# Patient Record
Sex: Female | Born: 1935
Health system: Southern US, Community
[De-identification: ages and names within clinical notes are randomized; demographics above are authoritative.]

## PROBLEM LIST (undated history)

## (undated) DIAGNOSIS — E785 Hyperlipidemia, unspecified: Secondary | ICD-10-CM

## (undated) DIAGNOSIS — I639 Cerebral infarction, unspecified: Secondary | ICD-10-CM

## (undated) DIAGNOSIS — I10 Essential (primary) hypertension: Secondary | ICD-10-CM

## (undated) DIAGNOSIS — I700 Atherosclerosis of aorta: Secondary | ICD-10-CM

## (undated) DIAGNOSIS — J449 Chronic obstructive pulmonary disease, unspecified: Secondary | ICD-10-CM

## (undated) DIAGNOSIS — I2510 Atherosclerotic heart disease of native coronary artery without angina pectoris: Secondary | ICD-10-CM

## (undated) HISTORY — DX: Atherosclerosis of aorta: I70.0

## (undated) HISTORY — DX: Chronic obstructive pulmonary disease, unspecified: J44.9

## (undated) HISTORY — DX: Essential (primary) hypertension: I10

## (undated) HISTORY — DX: Atherosclerotic heart disease of native coronary artery without angina pectoris: I25.10

## (undated) HISTORY — DX: Hyperlipidemia, unspecified: E78.5

## (undated) HISTORY — DX: Cerebral infarction, unspecified: I63.9

## (undated) HISTORY — PX: NECK SURGERY: SHX720

## (undated) HISTORY — PX: CAROTID ENDARTERECTOMY: SUR193

---

## 2000-11-11 ENCOUNTER — Encounter: Payer: Self-pay | Admitting: *Deleted

## 2000-11-13 ENCOUNTER — Inpatient Hospital Stay (HOSPITAL_COMMUNITY): Admission: RE | Admit: 2000-11-13 | Discharge: 2000-11-14 | Payer: Self-pay | Admitting: *Deleted

## 2001-03-13 ENCOUNTER — Encounter: Payer: Self-pay | Admitting: *Deleted

## 2001-03-16 ENCOUNTER — Inpatient Hospital Stay (HOSPITAL_COMMUNITY): Admission: RE | Admit: 2001-03-16 | Discharge: 2001-03-17 | Payer: Self-pay | Admitting: *Deleted

## 2005-01-01 ENCOUNTER — Encounter: Admission: RE | Admit: 2005-01-01 | Discharge: 2005-01-01 | Payer: Self-pay | Admitting: Orthopedic Surgery

## 2005-01-08 ENCOUNTER — Encounter: Admission: RE | Admit: 2005-01-08 | Discharge: 2005-01-08 | Payer: Self-pay | Admitting: Orthopedic Surgery

## 2005-01-10 ENCOUNTER — Ambulatory Visit (HOSPITAL_BASED_OUTPATIENT_CLINIC_OR_DEPARTMENT_OTHER): Admission: RE | Admit: 2005-01-10 | Discharge: 2005-01-10 | Payer: Self-pay | Admitting: Orthopedic Surgery

## 2005-01-10 ENCOUNTER — Ambulatory Visit (HOSPITAL_COMMUNITY): Admission: RE | Admit: 2005-01-10 | Discharge: 2005-01-10 | Payer: Self-pay | Admitting: Orthopedic Surgery

## 2011-02-11 DIAGNOSIS — R05 Cough: Secondary | ICD-10-CM | POA: Diagnosis not present

## 2011-02-11 DIAGNOSIS — E559 Vitamin D deficiency, unspecified: Secondary | ICD-10-CM | POA: Diagnosis not present

## 2011-02-11 DIAGNOSIS — I10 Essential (primary) hypertension: Secondary | ICD-10-CM | POA: Diagnosis not present

## 2011-02-11 DIAGNOSIS — E039 Hypothyroidism, unspecified: Secondary | ICD-10-CM | POA: Diagnosis not present

## 2011-02-19 DIAGNOSIS — J209 Acute bronchitis, unspecified: Secondary | ICD-10-CM | POA: Diagnosis not present

## 2011-03-19 DIAGNOSIS — I10 Essential (primary) hypertension: Secondary | ICD-10-CM | POA: Diagnosis not present

## 2011-03-19 DIAGNOSIS — E559 Vitamin D deficiency, unspecified: Secondary | ICD-10-CM | POA: Diagnosis not present

## 2011-03-26 DIAGNOSIS — Z1231 Encounter for screening mammogram for malignant neoplasm of breast: Secondary | ICD-10-CM | POA: Diagnosis not present

## 2011-07-09 DIAGNOSIS — J209 Acute bronchitis, unspecified: Secondary | ICD-10-CM | POA: Diagnosis not present

## 2011-07-09 DIAGNOSIS — J0100 Acute maxillary sinusitis, unspecified: Secondary | ICD-10-CM | POA: Diagnosis not present

## 2011-09-03 DIAGNOSIS — Z961 Presence of intraocular lens: Secondary | ICD-10-CM | POA: Diagnosis not present

## 2011-09-03 DIAGNOSIS — E7800 Pure hypercholesterolemia, unspecified: Secondary | ICD-10-CM | POA: Diagnosis not present

## 2011-09-03 DIAGNOSIS — E559 Vitamin D deficiency, unspecified: Secondary | ICD-10-CM | POA: Diagnosis not present

## 2011-09-03 DIAGNOSIS — J309 Allergic rhinitis, unspecified: Secondary | ICD-10-CM | POA: Diagnosis not present

## 2011-09-03 DIAGNOSIS — E039 Hypothyroidism, unspecified: Secondary | ICD-10-CM | POA: Diagnosis not present

## 2011-09-03 DIAGNOSIS — J449 Chronic obstructive pulmonary disease, unspecified: Secondary | ICD-10-CM | POA: Diagnosis not present

## 2011-12-04 DIAGNOSIS — Z23 Encounter for immunization: Secondary | ICD-10-CM | POA: Diagnosis not present

## 2011-12-28 DIAGNOSIS — J189 Pneumonia, unspecified organism: Secondary | ICD-10-CM | POA: Diagnosis not present

## 2012-01-01 DIAGNOSIS — J42 Unspecified chronic bronchitis: Secondary | ICD-10-CM | POA: Diagnosis not present

## 2012-01-17 DIAGNOSIS — L259 Unspecified contact dermatitis, unspecified cause: Secondary | ICD-10-CM | POA: Diagnosis not present

## 2012-02-10 DIAGNOSIS — M2060 Acquired deformities of toe(s), unspecified, unspecified foot: Secondary | ICD-10-CM | POA: Diagnosis not present

## 2012-02-10 DIAGNOSIS — L84 Corns and callosities: Secondary | ICD-10-CM | POA: Diagnosis not present

## 2012-02-10 DIAGNOSIS — L97509 Non-pressure chronic ulcer of other part of unspecified foot with unspecified severity: Secondary | ICD-10-CM | POA: Diagnosis not present

## 2012-02-24 DIAGNOSIS — M2060 Acquired deformities of toe(s), unspecified, unspecified foot: Secondary | ICD-10-CM | POA: Diagnosis not present

## 2012-02-24 DIAGNOSIS — L84 Corns and callosities: Secondary | ICD-10-CM | POA: Diagnosis not present

## 2012-04-01 DIAGNOSIS — I10 Essential (primary) hypertension: Secondary | ICD-10-CM | POA: Diagnosis not present

## 2012-04-01 DIAGNOSIS — M899 Disorder of bone, unspecified: Secondary | ICD-10-CM | POA: Diagnosis not present

## 2012-04-01 DIAGNOSIS — E039 Hypothyroidism, unspecified: Secondary | ICD-10-CM | POA: Diagnosis not present

## 2012-04-01 DIAGNOSIS — Z79899 Other long term (current) drug therapy: Secondary | ICD-10-CM | POA: Diagnosis not present

## 2012-04-24 DIAGNOSIS — J0100 Acute maxillary sinusitis, unspecified: Secondary | ICD-10-CM | POA: Diagnosis not present

## 2012-04-24 DIAGNOSIS — J209 Acute bronchitis, unspecified: Secondary | ICD-10-CM | POA: Diagnosis not present

## 2012-05-12 DIAGNOSIS — Z1231 Encounter for screening mammogram for malignant neoplasm of breast: Secondary | ICD-10-CM | POA: Diagnosis not present

## 2012-07-15 DIAGNOSIS — M5380 Other specified dorsopathies, site unspecified: Secondary | ICD-10-CM | POA: Diagnosis not present

## 2012-07-15 DIAGNOSIS — M999 Biomechanical lesion, unspecified: Secondary | ICD-10-CM | POA: Diagnosis not present

## 2012-07-17 DIAGNOSIS — M999 Biomechanical lesion, unspecified: Secondary | ICD-10-CM | POA: Diagnosis not present

## 2012-07-17 DIAGNOSIS — M5380 Other specified dorsopathies, site unspecified: Secondary | ICD-10-CM | POA: Diagnosis not present

## 2012-07-20 DIAGNOSIS — M5380 Other specified dorsopathies, site unspecified: Secondary | ICD-10-CM | POA: Diagnosis not present

## 2012-07-20 DIAGNOSIS — M999 Biomechanical lesion, unspecified: Secondary | ICD-10-CM | POA: Diagnosis not present

## 2012-07-24 DIAGNOSIS — M999 Biomechanical lesion, unspecified: Secondary | ICD-10-CM | POA: Diagnosis not present

## 2012-07-24 DIAGNOSIS — M5380 Other specified dorsopathies, site unspecified: Secondary | ICD-10-CM | POA: Diagnosis not present

## 2012-08-20 DIAGNOSIS — Z7902 Long term (current) use of antithrombotics/antiplatelets: Secondary | ICD-10-CM | POA: Diagnosis not present

## 2012-08-20 DIAGNOSIS — E7800 Pure hypercholesterolemia, unspecified: Secondary | ICD-10-CM | POA: Diagnosis not present

## 2012-08-20 DIAGNOSIS — Z006 Encounter for examination for normal comparison and control in clinical research program: Secondary | ICD-10-CM | POA: Diagnosis not present

## 2012-08-20 DIAGNOSIS — I6350 Cerebral infarction due to unspecified occlusion or stenosis of unspecified cerebral artery: Secondary | ICD-10-CM | POA: Diagnosis not present

## 2012-08-20 DIAGNOSIS — F17200 Nicotine dependence, unspecified, uncomplicated: Secondary | ICD-10-CM | POA: Diagnosis not present

## 2012-08-20 DIAGNOSIS — I10 Essential (primary) hypertension: Secondary | ICD-10-CM | POA: Diagnosis not present

## 2012-08-20 DIAGNOSIS — M810 Age-related osteoporosis without current pathological fracture: Secondary | ICD-10-CM | POA: Diagnosis not present

## 2012-08-21 DIAGNOSIS — E7800 Pure hypercholesterolemia, unspecified: Secondary | ICD-10-CM | POA: Diagnosis not present

## 2012-10-21 DIAGNOSIS — F17200 Nicotine dependence, unspecified, uncomplicated: Secondary | ICD-10-CM | POA: Diagnosis not present

## 2012-10-21 DIAGNOSIS — R0602 Shortness of breath: Secondary | ICD-10-CM | POA: Diagnosis not present

## 2012-10-21 DIAGNOSIS — J209 Acute bronchitis, unspecified: Secondary | ICD-10-CM | POA: Diagnosis not present

## 2012-10-21 DIAGNOSIS — J189 Pneumonia, unspecified organism: Secondary | ICD-10-CM | POA: Diagnosis not present

## 2013-01-07 DIAGNOSIS — Z23 Encounter for immunization: Secondary | ICD-10-CM | POA: Diagnosis not present

## 2013-04-12 DIAGNOSIS — E7800 Pure hypercholesterolemia, unspecified: Secondary | ICD-10-CM | POA: Diagnosis not present

## 2013-04-12 DIAGNOSIS — I10 Essential (primary) hypertension: Secondary | ICD-10-CM | POA: Diagnosis not present

## 2013-04-12 DIAGNOSIS — J309 Allergic rhinitis, unspecified: Secondary | ICD-10-CM | POA: Diagnosis not present

## 2013-04-12 DIAGNOSIS — I6350 Cerebral infarction due to unspecified occlusion or stenosis of unspecified cerebral artery: Secondary | ICD-10-CM | POA: Diagnosis not present

## 2013-04-12 DIAGNOSIS — Z0000 Encounter for general adult medical examination without abnormal findings: Secondary | ICD-10-CM | POA: Diagnosis not present

## 2013-04-26 DIAGNOSIS — I059 Rheumatic mitral valve disease, unspecified: Secondary | ICD-10-CM | POA: Diagnosis not present

## 2013-04-26 DIAGNOSIS — I517 Cardiomegaly: Secondary | ICD-10-CM | POA: Diagnosis not present

## 2013-04-26 DIAGNOSIS — I359 Nonrheumatic aortic valve disorder, unspecified: Secondary | ICD-10-CM | POA: Diagnosis not present

## 2013-04-26 DIAGNOSIS — I079 Rheumatic tricuspid valve disease, unspecified: Secondary | ICD-10-CM | POA: Diagnosis not present

## 2013-04-26 DIAGNOSIS — R011 Cardiac murmur, unspecified: Secondary | ICD-10-CM | POA: Diagnosis not present

## 2013-05-07 DIAGNOSIS — M999 Biomechanical lesion, unspecified: Secondary | ICD-10-CM | POA: Diagnosis not present

## 2013-05-07 DIAGNOSIS — M5380 Other specified dorsopathies, site unspecified: Secondary | ICD-10-CM | POA: Diagnosis not present

## 2013-05-18 DIAGNOSIS — M76899 Other specified enthesopathies of unspecified lower limb, excluding foot: Secondary | ICD-10-CM | POA: Diagnosis not present

## 2013-05-31 DIAGNOSIS — I10 Essential (primary) hypertension: Secondary | ICD-10-CM | POA: Diagnosis not present

## 2013-05-31 DIAGNOSIS — F17200 Nicotine dependence, unspecified, uncomplicated: Secondary | ICD-10-CM | POA: Diagnosis not present

## 2013-05-31 DIAGNOSIS — I359 Nonrheumatic aortic valve disorder, unspecified: Secondary | ICD-10-CM | POA: Diagnosis not present

## 2013-05-31 DIAGNOSIS — E039 Hypothyroidism, unspecified: Secondary | ICD-10-CM | POA: Diagnosis not present

## 2013-05-31 DIAGNOSIS — E7800 Pure hypercholesterolemia, unspecified: Secondary | ICD-10-CM | POA: Diagnosis not present

## 2013-05-31 DIAGNOSIS — Z8673 Personal history of transient ischemic attack (TIA), and cerebral infarction without residual deficits: Secondary | ICD-10-CM | POA: Diagnosis not present

## 2013-06-02 DIAGNOSIS — L84 Corns and callosities: Secondary | ICD-10-CM | POA: Diagnosis not present

## 2013-06-02 DIAGNOSIS — M2060 Acquired deformities of toe(s), unspecified, unspecified foot: Secondary | ICD-10-CM | POA: Diagnosis not present

## 2013-06-08 DIAGNOSIS — I6529 Occlusion and stenosis of unspecified carotid artery: Secondary | ICD-10-CM | POA: Diagnosis not present

## 2013-06-08 DIAGNOSIS — R0602 Shortness of breath: Secondary | ICD-10-CM | POA: Diagnosis not present

## 2013-06-18 DIAGNOSIS — M47816 Spondylosis without myelopathy or radiculopathy, lumbar region: Secondary | ICD-10-CM | POA: Insufficient documentation

## 2013-06-18 DIAGNOSIS — M5137 Other intervertebral disc degeneration, lumbosacral region: Secondary | ICD-10-CM | POA: Diagnosis not present

## 2013-06-18 DIAGNOSIS — M545 Low back pain, unspecified: Secondary | ICD-10-CM | POA: Diagnosis not present

## 2013-06-18 DIAGNOSIS — M76899 Other specified enthesopathies of unspecified lower limb, excluding foot: Secondary | ICD-10-CM | POA: Diagnosis not present

## 2013-06-18 DIAGNOSIS — M47817 Spondylosis without myelopathy or radiculopathy, lumbosacral region: Secondary | ICD-10-CM | POA: Diagnosis not present

## 2013-06-18 DIAGNOSIS — M5136 Other intervertebral disc degeneration, lumbar region: Secondary | ICD-10-CM

## 2013-06-18 DIAGNOSIS — M51369 Other intervertebral disc degeneration, lumbar region without mention of lumbar back pain or lower extremity pain: Secondary | ICD-10-CM

## 2013-06-18 HISTORY — DX: Spondylosis without myelopathy or radiculopathy, lumbar region: M47.816

## 2013-06-18 HISTORY — DX: Other intervertebral disc degeneration, lumbar region: M51.36

## 2013-06-18 HISTORY — DX: Other intervertebral disc degeneration, lumbar region without mention of lumbar back pain or lower extremity pain: M51.369

## 2013-07-05 DIAGNOSIS — IMO0002 Reserved for concepts with insufficient information to code with codable children: Secondary | ICD-10-CM | POA: Diagnosis not present

## 2013-07-05 DIAGNOSIS — M47817 Spondylosis without myelopathy or radiculopathy, lumbosacral region: Secondary | ICD-10-CM | POA: Diagnosis not present

## 2013-07-05 DIAGNOSIS — M5137 Other intervertebral disc degeneration, lumbosacral region: Secondary | ICD-10-CM | POA: Diagnosis not present

## 2013-07-05 DIAGNOSIS — M129 Arthropathy, unspecified: Secondary | ICD-10-CM | POA: Diagnosis not present

## 2013-07-05 DIAGNOSIS — M48061 Spinal stenosis, lumbar region without neurogenic claudication: Secondary | ICD-10-CM | POA: Diagnosis not present

## 2013-07-05 DIAGNOSIS — M76899 Other specified enthesopathies of unspecified lower limb, excluding foot: Secondary | ICD-10-CM | POA: Diagnosis not present

## 2013-07-12 DIAGNOSIS — IMO0002 Reserved for concepts with insufficient information to code with codable children: Secondary | ICD-10-CM | POA: Diagnosis not present

## 2013-07-21 DIAGNOSIS — F17200 Nicotine dependence, unspecified, uncomplicated: Secondary | ICD-10-CM | POA: Diagnosis not present

## 2013-07-21 DIAGNOSIS — E7800 Pure hypercholesterolemia, unspecified: Secondary | ICD-10-CM | POA: Diagnosis not present

## 2013-07-21 DIAGNOSIS — Z23 Encounter for immunization: Secondary | ICD-10-CM | POA: Diagnosis not present

## 2013-07-21 DIAGNOSIS — I10 Essential (primary) hypertension: Secondary | ICD-10-CM | POA: Diagnosis not present

## 2013-07-28 DIAGNOSIS — M545 Low back pain, unspecified: Secondary | ICD-10-CM | POA: Diagnosis not present

## 2013-07-28 DIAGNOSIS — M47817 Spondylosis without myelopathy or radiculopathy, lumbosacral region: Secondary | ICD-10-CM | POA: Diagnosis not present

## 2013-07-28 DIAGNOSIS — M5137 Other intervertebral disc degeneration, lumbosacral region: Secondary | ICD-10-CM | POA: Diagnosis not present

## 2013-07-28 DIAGNOSIS — IMO0002 Reserved for concepts with insufficient information to code with codable children: Secondary | ICD-10-CM | POA: Diagnosis not present

## 2013-08-02 DIAGNOSIS — E7800 Pure hypercholesterolemia, unspecified: Secondary | ICD-10-CM | POA: Diagnosis not present

## 2013-08-02 DIAGNOSIS — I359 Nonrheumatic aortic valve disorder, unspecified: Secondary | ICD-10-CM | POA: Diagnosis not present

## 2013-08-02 DIAGNOSIS — I10 Essential (primary) hypertension: Secondary | ICD-10-CM | POA: Diagnosis not present

## 2013-08-02 DIAGNOSIS — Z8673 Personal history of transient ischemic attack (TIA), and cerebral infarction without residual deficits: Secondary | ICD-10-CM | POA: Diagnosis not present

## 2013-08-18 DIAGNOSIS — I129 Hypertensive chronic kidney disease with stage 1 through stage 4 chronic kidney disease, or unspecified chronic kidney disease: Secondary | ICD-10-CM

## 2013-08-18 DIAGNOSIS — I10 Essential (primary) hypertension: Secondary | ICD-10-CM

## 2013-08-18 DIAGNOSIS — M48062 Spinal stenosis, lumbar region with neurogenic claudication: Secondary | ICD-10-CM

## 2013-08-18 DIAGNOSIS — M5137 Other intervertebral disc degeneration, lumbosacral region: Secondary | ICD-10-CM | POA: Diagnosis not present

## 2013-08-18 DIAGNOSIS — M47817 Spondylosis without myelopathy or radiculopathy, lumbosacral region: Secondary | ICD-10-CM | POA: Diagnosis not present

## 2013-08-18 DIAGNOSIS — M48061 Spinal stenosis, lumbar region without neurogenic claudication: Secondary | ICD-10-CM | POA: Diagnosis not present

## 2013-08-18 DIAGNOSIS — E785 Hyperlipidemia, unspecified: Secondary | ICD-10-CM

## 2013-08-18 DIAGNOSIS — IMO0002 Reserved for concepts with insufficient information to code with codable children: Secondary | ICD-10-CM | POA: Diagnosis not present

## 2013-08-18 HISTORY — DX: Hyperlipidemia, unspecified: E78.5

## 2013-08-18 HISTORY — DX: Hypertensive chronic kidney disease with stage 1 through stage 4 chronic kidney disease, or unspecified chronic kidney disease: I12.9

## 2013-08-18 HISTORY — DX: Essential (primary) hypertension: I10

## 2013-08-18 HISTORY — DX: Spinal stenosis, lumbar region with neurogenic claudication: M48.062

## 2013-09-08 DIAGNOSIS — I639 Cerebral infarction, unspecified: Secondary | ICD-10-CM

## 2013-09-08 DIAGNOSIS — IMO0002 Reserved for concepts with insufficient information to code with codable children: Secondary | ICD-10-CM | POA: Diagnosis not present

## 2013-09-08 DIAGNOSIS — I6350 Cerebral infarction due to unspecified occlusion or stenosis of unspecified cerebral artery: Secondary | ICD-10-CM | POA: Diagnosis not present

## 2013-09-08 HISTORY — DX: Cerebral infarction, unspecified: I63.9

## 2013-09-16 DIAGNOSIS — G459 Transient cerebral ischemic attack, unspecified: Secondary | ICD-10-CM | POA: Diagnosis not present

## 2013-09-16 DIAGNOSIS — I6789 Other cerebrovascular disease: Secondary | ICD-10-CM | POA: Diagnosis not present

## 2013-09-20 DIAGNOSIS — M47817 Spondylosis without myelopathy or radiculopathy, lumbosacral region: Secondary | ICD-10-CM | POA: Diagnosis not present

## 2013-09-20 DIAGNOSIS — M25579 Pain in unspecified ankle and joints of unspecified foot: Secondary | ICD-10-CM | POA: Diagnosis not present

## 2013-09-20 DIAGNOSIS — M48061 Spinal stenosis, lumbar region without neurogenic claudication: Secondary | ICD-10-CM | POA: Diagnosis not present

## 2013-09-20 DIAGNOSIS — M5137 Other intervertebral disc degeneration, lumbosacral region: Secondary | ICD-10-CM | POA: Diagnosis not present

## 2013-09-21 DIAGNOSIS — I6350 Cerebral infarction due to unspecified occlusion or stenosis of unspecified cerebral artery: Secondary | ICD-10-CM | POA: Diagnosis not present

## 2013-09-21 DIAGNOSIS — I658 Occlusion and stenosis of other precerebral arteries: Secondary | ICD-10-CM | POA: Diagnosis not present

## 2013-09-27 DIAGNOSIS — I6350 Cerebral infarction due to unspecified occlusion or stenosis of unspecified cerebral artery: Secondary | ICD-10-CM | POA: Diagnosis not present

## 2013-09-27 DIAGNOSIS — IMO0002 Reserved for concepts with insufficient information to code with codable children: Secondary | ICD-10-CM | POA: Diagnosis not present

## 2013-10-13 DIAGNOSIS — M47817 Spondylosis without myelopathy or radiculopathy, lumbosacral region: Secondary | ICD-10-CM | POA: Diagnosis not present

## 2013-10-13 DIAGNOSIS — M48061 Spinal stenosis, lumbar region without neurogenic claudication: Secondary | ICD-10-CM | POA: Diagnosis not present

## 2013-10-13 DIAGNOSIS — M5137 Other intervertebral disc degeneration, lumbosacral region: Secondary | ICD-10-CM | POA: Diagnosis not present

## 2013-10-13 DIAGNOSIS — I10 Essential (primary) hypertension: Secondary | ICD-10-CM | POA: Diagnosis not present

## 2013-10-13 DIAGNOSIS — M25579 Pain in unspecified ankle and joints of unspecified foot: Secondary | ICD-10-CM | POA: Diagnosis not present

## 2013-10-26 DIAGNOSIS — Z1231 Encounter for screening mammogram for malignant neoplasm of breast: Secondary | ICD-10-CM | POA: Diagnosis not present

## 2013-10-27 DIAGNOSIS — L821 Other seborrheic keratosis: Secondary | ICD-10-CM | POA: Diagnosis not present

## 2013-11-01 DIAGNOSIS — Z23 Encounter for immunization: Secondary | ICD-10-CM | POA: Diagnosis not present

## 2013-11-11 DIAGNOSIS — N939 Abnormal uterine and vaginal bleeding, unspecified: Secondary | ICD-10-CM | POA: Diagnosis not present

## 2013-12-14 DIAGNOSIS — N950 Postmenopausal bleeding: Secondary | ICD-10-CM | POA: Diagnosis not present

## 2014-01-13 DIAGNOSIS — J209 Acute bronchitis, unspecified: Secondary | ICD-10-CM | POA: Diagnosis not present

## 2014-01-13 DIAGNOSIS — J449 Chronic obstructive pulmonary disease, unspecified: Secondary | ICD-10-CM | POA: Diagnosis not present

## 2014-01-13 DIAGNOSIS — R0902 Hypoxemia: Secondary | ICD-10-CM | POA: Diagnosis not present

## 2014-01-17 DIAGNOSIS — J441 Chronic obstructive pulmonary disease with (acute) exacerbation: Secondary | ICD-10-CM | POA: Diagnosis not present

## 2014-01-20 DIAGNOSIS — H524 Presbyopia: Secondary | ICD-10-CM | POA: Diagnosis not present

## 2014-01-20 DIAGNOSIS — H52223 Regular astigmatism, bilateral: Secondary | ICD-10-CM | POA: Diagnosis not present

## 2014-01-20 DIAGNOSIS — H5203 Hypermetropia, bilateral: Secondary | ICD-10-CM | POA: Diagnosis not present

## 2014-01-20 DIAGNOSIS — Z9849 Cataract extraction status, unspecified eye: Secondary | ICD-10-CM | POA: Diagnosis not present

## 2014-01-20 DIAGNOSIS — H35371 Puckering of macula, right eye: Secondary | ICD-10-CM | POA: Diagnosis not present

## 2014-01-20 DIAGNOSIS — H35372 Puckering of macula, left eye: Secondary | ICD-10-CM | POA: Diagnosis not present

## 2014-01-20 DIAGNOSIS — I10 Essential (primary) hypertension: Secondary | ICD-10-CM | POA: Diagnosis not present

## 2014-01-20 DIAGNOSIS — Z961 Presence of intraocular lens: Secondary | ICD-10-CM | POA: Diagnosis not present

## 2014-04-14 DIAGNOSIS — Z0000 Encounter for general adult medical examination without abnormal findings: Secondary | ICD-10-CM | POA: Diagnosis not present

## 2014-04-14 DIAGNOSIS — Z720 Tobacco use: Secondary | ICD-10-CM | POA: Diagnosis not present

## 2014-04-14 DIAGNOSIS — Z1389 Encounter for screening for other disorder: Secondary | ICD-10-CM | POA: Diagnosis not present

## 2014-04-14 DIAGNOSIS — R0602 Shortness of breath: Secondary | ICD-10-CM | POA: Diagnosis not present

## 2014-04-14 DIAGNOSIS — Z9181 History of falling: Secondary | ICD-10-CM | POA: Diagnosis not present

## 2014-04-14 DIAGNOSIS — E780 Pure hypercholesterolemia: Secondary | ICD-10-CM | POA: Diagnosis not present

## 2014-04-14 DIAGNOSIS — Z79899 Other long term (current) drug therapy: Secondary | ICD-10-CM | POA: Diagnosis not present

## 2014-04-14 DIAGNOSIS — D509 Iron deficiency anemia, unspecified: Secondary | ICD-10-CM | POA: Diagnosis not present

## 2014-04-19 DIAGNOSIS — R0602 Shortness of breath: Secondary | ICD-10-CM | POA: Diagnosis not present

## 2014-04-22 DIAGNOSIS — H43821 Vitreomacular adhesion, right eye: Secondary | ICD-10-CM | POA: Diagnosis not present

## 2014-04-22 DIAGNOSIS — I10 Essential (primary) hypertension: Secondary | ICD-10-CM | POA: Diagnosis not present

## 2014-04-22 DIAGNOSIS — H524 Presbyopia: Secondary | ICD-10-CM | POA: Diagnosis not present

## 2014-04-22 DIAGNOSIS — H5203 Hypermetropia, bilateral: Secondary | ICD-10-CM | POA: Diagnosis not present

## 2014-04-22 DIAGNOSIS — H35371 Puckering of macula, right eye: Secondary | ICD-10-CM | POA: Diagnosis not present

## 2014-04-22 DIAGNOSIS — H52223 Regular astigmatism, bilateral: Secondary | ICD-10-CM | POA: Diagnosis not present

## 2014-04-25 DIAGNOSIS — E876 Hypokalemia: Secondary | ICD-10-CM | POA: Diagnosis not present

## 2014-06-07 DIAGNOSIS — M5416 Radiculopathy, lumbar region: Secondary | ICD-10-CM | POA: Diagnosis not present

## 2014-06-07 DIAGNOSIS — M7062 Trochanteric bursitis, left hip: Secondary | ICD-10-CM | POA: Diagnosis not present

## 2014-06-07 DIAGNOSIS — M5136 Other intervertebral disc degeneration, lumbar region: Secondary | ICD-10-CM | POA: Diagnosis not present

## 2014-06-07 DIAGNOSIS — M4726 Other spondylosis with radiculopathy, lumbar region: Secondary | ICD-10-CM | POA: Diagnosis not present

## 2014-06-24 DIAGNOSIS — M5442 Lumbago with sciatica, left side: Secondary | ICD-10-CM | POA: Diagnosis not present

## 2014-06-24 DIAGNOSIS — M5136 Other intervertebral disc degeneration, lumbar region: Secondary | ICD-10-CM | POA: Diagnosis not present

## 2014-06-24 DIAGNOSIS — M5416 Radiculopathy, lumbar region: Secondary | ICD-10-CM | POA: Diagnosis not present

## 2014-06-24 DIAGNOSIS — M4726 Other spondylosis with radiculopathy, lumbar region: Secondary | ICD-10-CM | POA: Diagnosis not present

## 2014-08-19 DIAGNOSIS — M5416 Radiculopathy, lumbar region: Secondary | ICD-10-CM | POA: Diagnosis not present

## 2014-11-09 DIAGNOSIS — Q253 Supravalvular aortic stenosis: Secondary | ICD-10-CM | POA: Diagnosis not present

## 2014-11-09 DIAGNOSIS — I6990 Unspecified sequelae of unspecified cerebrovascular disease: Secondary | ICD-10-CM

## 2014-11-09 DIAGNOSIS — I350 Nonrheumatic aortic (valve) stenosis: Secondary | ICD-10-CM

## 2014-11-09 DIAGNOSIS — E782 Mixed hyperlipidemia: Secondary | ICD-10-CM | POA: Diagnosis not present

## 2014-11-09 HISTORY — DX: Nonrheumatic aortic (valve) stenosis: I35.0

## 2014-11-09 HISTORY — DX: Unspecified sequelae of unspecified cerebrovascular disease: I69.90

## 2014-11-18 DIAGNOSIS — E785 Hyperlipidemia, unspecified: Secondary | ICD-10-CM | POA: Diagnosis not present

## 2014-11-18 DIAGNOSIS — I350 Nonrheumatic aortic (valve) stenosis: Secondary | ICD-10-CM | POA: Diagnosis not present

## 2014-11-18 DIAGNOSIS — I10 Essential (primary) hypertension: Secondary | ICD-10-CM | POA: Diagnosis not present

## 2014-11-22 DIAGNOSIS — H35371 Puckering of macula, right eye: Secondary | ICD-10-CM | POA: Diagnosis not present

## 2014-11-22 DIAGNOSIS — H43823 Vitreomacular adhesion, bilateral: Secondary | ICD-10-CM | POA: Diagnosis not present

## 2014-11-29 DIAGNOSIS — Z1231 Encounter for screening mammogram for malignant neoplasm of breast: Secondary | ICD-10-CM | POA: Diagnosis not present

## 2014-12-09 DIAGNOSIS — Z23 Encounter for immunization: Secondary | ICD-10-CM | POA: Diagnosis not present

## 2014-12-15 DIAGNOSIS — I10 Essential (primary) hypertension: Secondary | ICD-10-CM | POA: Diagnosis not present

## 2014-12-16 DIAGNOSIS — Z79899 Other long term (current) drug therapy: Secondary | ICD-10-CM | POA: Diagnosis not present

## 2014-12-16 DIAGNOSIS — R5383 Other fatigue: Secondary | ICD-10-CM | POA: Diagnosis not present

## 2014-12-16 DIAGNOSIS — E7800 Pure hypercholesterolemia, unspecified: Secondary | ICD-10-CM | POA: Diagnosis not present

## 2014-12-16 DIAGNOSIS — F17200 Nicotine dependence, unspecified, uncomplicated: Secondary | ICD-10-CM | POA: Diagnosis not present

## 2014-12-16 DIAGNOSIS — I10 Essential (primary) hypertension: Secondary | ICD-10-CM | POA: Diagnosis not present

## 2014-12-26 DIAGNOSIS — D510 Vitamin B12 deficiency anemia due to intrinsic factor deficiency: Secondary | ICD-10-CM | POA: Diagnosis not present

## 2015-01-02 DIAGNOSIS — D510 Vitamin B12 deficiency anemia due to intrinsic factor deficiency: Secondary | ICD-10-CM | POA: Diagnosis not present

## 2015-01-09 DIAGNOSIS — D510 Vitamin B12 deficiency anemia due to intrinsic factor deficiency: Secondary | ICD-10-CM | POA: Diagnosis not present

## 2015-02-27 DIAGNOSIS — D510 Vitamin B12 deficiency anemia due to intrinsic factor deficiency: Secondary | ICD-10-CM | POA: Diagnosis not present

## 2015-03-29 DIAGNOSIS — D510 Vitamin B12 deficiency anemia due to intrinsic factor deficiency: Secondary | ICD-10-CM | POA: Diagnosis not present

## 2015-05-04 DIAGNOSIS — D510 Vitamin B12 deficiency anemia due to intrinsic factor deficiency: Secondary | ICD-10-CM | POA: Diagnosis not present

## 2015-05-04 DIAGNOSIS — Z0000 Encounter for general adult medical examination without abnormal findings: Secondary | ICD-10-CM | POA: Diagnosis not present

## 2015-05-04 DIAGNOSIS — Z9181 History of falling: Secondary | ICD-10-CM | POA: Diagnosis not present

## 2015-05-04 DIAGNOSIS — Z1389 Encounter for screening for other disorder: Secondary | ICD-10-CM | POA: Diagnosis not present

## 2015-05-04 DIAGNOSIS — I10 Essential (primary) hypertension: Secondary | ICD-10-CM | POA: Diagnosis not present

## 2015-05-23 DIAGNOSIS — H35373 Puckering of macula, bilateral: Secondary | ICD-10-CM | POA: Diagnosis not present

## 2015-05-23 DIAGNOSIS — F17200 Nicotine dependence, unspecified, uncomplicated: Secondary | ICD-10-CM | POA: Diagnosis not present

## 2015-05-23 DIAGNOSIS — IMO0001 Reserved for inherently not codable concepts without codable children: Secondary | ICD-10-CM | POA: Insufficient documentation

## 2015-05-23 DIAGNOSIS — H43313 Vitreous membranes and strands, bilateral: Secondary | ICD-10-CM | POA: Diagnosis not present

## 2015-05-23 DIAGNOSIS — H43393 Other vitreous opacities, bilateral: Secondary | ICD-10-CM | POA: Diagnosis not present

## 2015-05-23 DIAGNOSIS — H35341 Macular cyst, hole, or pseudohole, right eye: Secondary | ICD-10-CM | POA: Diagnosis not present

## 2015-05-23 DIAGNOSIS — I10 Essential (primary) hypertension: Secondary | ICD-10-CM | POA: Diagnosis not present

## 2015-05-23 DIAGNOSIS — E7800 Pure hypercholesterolemia, unspecified: Secondary | ICD-10-CM | POA: Diagnosis not present

## 2015-05-23 DIAGNOSIS — Z9841 Cataract extraction status, right eye: Secondary | ICD-10-CM | POA: Diagnosis not present

## 2015-05-23 DIAGNOSIS — H35371 Puckering of macula, right eye: Secondary | ICD-10-CM | POA: Diagnosis not present

## 2015-05-23 DIAGNOSIS — Z961 Presence of intraocular lens: Secondary | ICD-10-CM | POA: Diagnosis not present

## 2015-05-23 DIAGNOSIS — H59031 Cystoid macular edema following cataract surgery, right eye: Secondary | ICD-10-CM | POA: Diagnosis not present

## 2015-05-23 DIAGNOSIS — H3562 Retinal hemorrhage, left eye: Secondary | ICD-10-CM | POA: Diagnosis not present

## 2015-05-23 DIAGNOSIS — H52223 Regular astigmatism, bilateral: Secondary | ICD-10-CM | POA: Diagnosis not present

## 2015-05-23 DIAGNOSIS — I350 Nonrheumatic aortic (valve) stenosis: Secondary | ICD-10-CM | POA: Diagnosis not present

## 2015-05-23 DIAGNOSIS — H43813 Vitreous degeneration, bilateral: Secondary | ICD-10-CM | POA: Diagnosis not present

## 2015-05-23 DIAGNOSIS — Z9842 Cataract extraction status, left eye: Secondary | ICD-10-CM | POA: Diagnosis not present

## 2015-05-23 HISTORY — DX: Nicotine dependence, unspecified, uncomplicated: F17.200

## 2015-05-23 HISTORY — DX: Reserved for inherently not codable concepts without codable children: IMO0001

## 2015-05-31 DIAGNOSIS — I517 Cardiomegaly: Secondary | ICD-10-CM | POA: Diagnosis not present

## 2015-05-31 DIAGNOSIS — I350 Nonrheumatic aortic (valve) stenosis: Secondary | ICD-10-CM | POA: Diagnosis not present

## 2015-06-08 DIAGNOSIS — D510 Vitamin B12 deficiency anemia due to intrinsic factor deficiency: Secondary | ICD-10-CM | POA: Diagnosis not present

## 2015-08-22 DIAGNOSIS — H3562 Retinal hemorrhage, left eye: Secondary | ICD-10-CM | POA: Diagnosis not present

## 2015-08-22 DIAGNOSIS — I10 Essential (primary) hypertension: Secondary | ICD-10-CM | POA: Diagnosis not present

## 2015-10-02 DIAGNOSIS — N3090 Cystitis, unspecified without hematuria: Secondary | ICD-10-CM | POA: Diagnosis not present

## 2015-10-02 DIAGNOSIS — N3001 Acute cystitis with hematuria: Secondary | ICD-10-CM | POA: Diagnosis not present

## 2015-10-31 DIAGNOSIS — M5440 Lumbago with sciatica, unspecified side: Secondary | ICD-10-CM | POA: Diagnosis not present

## 2015-10-31 DIAGNOSIS — M5416 Radiculopathy, lumbar region: Secondary | ICD-10-CM | POA: Diagnosis not present

## 2015-10-31 DIAGNOSIS — M4806 Spinal stenosis, lumbar region: Secondary | ICD-10-CM | POA: Diagnosis not present

## 2015-10-31 DIAGNOSIS — M47816 Spondylosis without myelopathy or radiculopathy, lumbar region: Secondary | ICD-10-CM | POA: Diagnosis not present

## 2015-10-31 DIAGNOSIS — M62830 Muscle spasm of back: Secondary | ICD-10-CM | POA: Diagnosis not present

## 2015-10-31 DIAGNOSIS — M5136 Other intervertebral disc degeneration, lumbar region: Secondary | ICD-10-CM | POA: Diagnosis not present

## 2015-10-31 DIAGNOSIS — G8929 Other chronic pain: Secondary | ICD-10-CM | POA: Diagnosis not present

## 2015-11-13 DIAGNOSIS — Z23 Encounter for immunization: Secondary | ICD-10-CM | POA: Diagnosis not present

## 2015-11-22 DIAGNOSIS — E782 Mixed hyperlipidemia: Secondary | ICD-10-CM | POA: Diagnosis not present

## 2015-11-22 DIAGNOSIS — F17200 Nicotine dependence, unspecified, uncomplicated: Secondary | ICD-10-CM | POA: Diagnosis not present

## 2015-11-22 DIAGNOSIS — I6990 Unspecified sequelae of unspecified cerebrovascular disease: Secondary | ICD-10-CM | POA: Diagnosis not present

## 2015-11-22 DIAGNOSIS — I350 Nonrheumatic aortic (valve) stenosis: Secondary | ICD-10-CM | POA: Diagnosis not present

## 2015-11-29 DIAGNOSIS — E782 Mixed hyperlipidemia: Secondary | ICD-10-CM | POA: Diagnosis not present

## 2015-11-29 DIAGNOSIS — F17200 Nicotine dependence, unspecified, uncomplicated: Secondary | ICD-10-CM | POA: Diagnosis not present

## 2015-11-29 DIAGNOSIS — I6990 Unspecified sequelae of unspecified cerebrovascular disease: Secondary | ICD-10-CM | POA: Diagnosis not present

## 2015-11-29 DIAGNOSIS — I350 Nonrheumatic aortic (valve) stenosis: Secondary | ICD-10-CM | POA: Diagnosis not present

## 2015-12-21 DIAGNOSIS — M5416 Radiculopathy, lumbar region: Secondary | ICD-10-CM | POA: Diagnosis not present

## 2015-12-21 DIAGNOSIS — M47816 Spondylosis without myelopathy or radiculopathy, lumbar region: Secondary | ICD-10-CM | POA: Diagnosis not present

## 2015-12-21 DIAGNOSIS — M5136 Other intervertebral disc degeneration, lumbar region: Secondary | ICD-10-CM | POA: Diagnosis not present

## 2015-12-21 DIAGNOSIS — M48061 Spinal stenosis, lumbar region without neurogenic claudication: Secondary | ICD-10-CM | POA: Diagnosis not present

## 2015-12-21 DIAGNOSIS — M62830 Muscle spasm of back: Secondary | ICD-10-CM | POA: Diagnosis not present

## 2016-01-17 DIAGNOSIS — Z9181 History of falling: Secondary | ICD-10-CM | POA: Diagnosis not present

## 2016-01-17 DIAGNOSIS — E7800 Pure hypercholesterolemia, unspecified: Secondary | ICD-10-CM | POA: Diagnosis not present

## 2016-01-17 DIAGNOSIS — I10 Essential (primary) hypertension: Secondary | ICD-10-CM | POA: Diagnosis not present

## 2016-01-17 DIAGNOSIS — D510 Vitamin B12 deficiency anemia due to intrinsic factor deficiency: Secondary | ICD-10-CM | POA: Diagnosis not present

## 2016-01-17 DIAGNOSIS — Z8673 Personal history of transient ischemic attack (TIA), and cerebral infarction without residual deficits: Secondary | ICD-10-CM | POA: Diagnosis not present

## 2016-01-17 DIAGNOSIS — E039 Hypothyroidism, unspecified: Secondary | ICD-10-CM | POA: Diagnosis not present

## 2016-01-17 DIAGNOSIS — Z1389 Encounter for screening for other disorder: Secondary | ICD-10-CM | POA: Diagnosis not present

## 2016-03-13 DIAGNOSIS — Z1231 Encounter for screening mammogram for malignant neoplasm of breast: Secondary | ICD-10-CM | POA: Diagnosis not present

## 2016-05-01 DIAGNOSIS — M47816 Spondylosis without myelopathy or radiculopathy, lumbar region: Secondary | ICD-10-CM | POA: Diagnosis not present

## 2016-05-01 DIAGNOSIS — M5136 Other intervertebral disc degeneration, lumbar region: Secondary | ICD-10-CM | POA: Diagnosis not present

## 2016-05-01 DIAGNOSIS — M5416 Radiculopathy, lumbar region: Secondary | ICD-10-CM | POA: Diagnosis not present

## 2016-05-01 DIAGNOSIS — M48062 Spinal stenosis, lumbar region with neurogenic claudication: Secondary | ICD-10-CM | POA: Diagnosis not present

## 2016-06-19 DIAGNOSIS — I350 Nonrheumatic aortic (valve) stenosis: Secondary | ICD-10-CM | POA: Diagnosis not present

## 2016-06-19 DIAGNOSIS — I10 Essential (primary) hypertension: Secondary | ICD-10-CM | POA: Diagnosis not present

## 2016-06-19 DIAGNOSIS — F17200 Nicotine dependence, unspecified, uncomplicated: Secondary | ICD-10-CM | POA: Diagnosis not present

## 2016-06-19 DIAGNOSIS — E782 Mixed hyperlipidemia: Secondary | ICD-10-CM | POA: Diagnosis not present

## 2016-06-26 DIAGNOSIS — I350 Nonrheumatic aortic (valve) stenosis: Secondary | ICD-10-CM | POA: Diagnosis not present

## 2016-09-30 DIAGNOSIS — S0093XA Contusion of unspecified part of head, initial encounter: Secondary | ICD-10-CM | POA: Diagnosis not present

## 2016-09-30 DIAGNOSIS — Z1389 Encounter for screening for other disorder: Secondary | ICD-10-CM | POA: Diagnosis not present

## 2016-09-30 DIAGNOSIS — W19XXXA Unspecified fall, initial encounter: Secondary | ICD-10-CM | POA: Diagnosis not present

## 2016-09-30 DIAGNOSIS — Z0000 Encounter for general adult medical examination without abnormal findings: Secondary | ICD-10-CM | POA: Diagnosis not present

## 2016-09-30 DIAGNOSIS — I10 Essential (primary) hypertension: Secondary | ICD-10-CM | POA: Diagnosis not present

## 2016-09-30 DIAGNOSIS — S0083XA Contusion of other part of head, initial encounter: Secondary | ICD-10-CM | POA: Diagnosis not present

## 2016-09-30 DIAGNOSIS — R51 Headache: Secondary | ICD-10-CM | POA: Diagnosis not present

## 2016-10-03 DIAGNOSIS — I10 Essential (primary) hypertension: Secondary | ICD-10-CM | POA: Diagnosis not present

## 2016-10-03 DIAGNOSIS — Z6833 Body mass index (BMI) 33.0-33.9, adult: Secondary | ICD-10-CM | POA: Diagnosis not present

## 2016-10-03 DIAGNOSIS — S0003XA Contusion of scalp, initial encounter: Secondary | ICD-10-CM | POA: Diagnosis not present

## 2016-10-03 DIAGNOSIS — R269 Unspecified abnormalities of gait and mobility: Secondary | ICD-10-CM | POA: Diagnosis not present

## 2016-11-04 DIAGNOSIS — H524 Presbyopia: Secondary | ICD-10-CM | POA: Diagnosis not present

## 2016-11-04 DIAGNOSIS — H43313 Vitreous membranes and strands, bilateral: Secondary | ICD-10-CM | POA: Diagnosis not present

## 2016-11-04 DIAGNOSIS — I10 Essential (primary) hypertension: Secondary | ICD-10-CM | POA: Diagnosis not present

## 2016-11-04 DIAGNOSIS — H5203 Hypermetropia, bilateral: Secondary | ICD-10-CM | POA: Diagnosis not present

## 2016-11-04 DIAGNOSIS — Z961 Presence of intraocular lens: Secondary | ICD-10-CM | POA: Diagnosis not present

## 2016-11-04 DIAGNOSIS — H52223 Regular astigmatism, bilateral: Secondary | ICD-10-CM | POA: Diagnosis not present

## 2016-11-04 DIAGNOSIS — H43813 Vitreous degeneration, bilateral: Secondary | ICD-10-CM | POA: Diagnosis not present

## 2016-11-04 DIAGNOSIS — H35373 Puckering of macula, bilateral: Secondary | ICD-10-CM | POA: Diagnosis not present

## 2016-12-06 DIAGNOSIS — Z6834 Body mass index (BMI) 34.0-34.9, adult: Secondary | ICD-10-CM | POA: Diagnosis not present

## 2016-12-06 DIAGNOSIS — Z23 Encounter for immunization: Secondary | ICD-10-CM | POA: Diagnosis not present

## 2016-12-06 DIAGNOSIS — E559 Vitamin D deficiency, unspecified: Secondary | ICD-10-CM | POA: Diagnosis not present

## 2016-12-06 DIAGNOSIS — Z79899 Other long term (current) drug therapy: Secondary | ICD-10-CM | POA: Diagnosis not present

## 2016-12-06 DIAGNOSIS — Z1339 Encounter for screening examination for other mental health and behavioral disorders: Secondary | ICD-10-CM | POA: Diagnosis not present

## 2016-12-06 DIAGNOSIS — E7800 Pure hypercholesterolemia, unspecified: Secondary | ICD-10-CM | POA: Diagnosis not present

## 2016-12-06 DIAGNOSIS — R600 Localized edema: Secondary | ICD-10-CM | POA: Diagnosis not present

## 2016-12-06 DIAGNOSIS — Z1331 Encounter for screening for depression: Secondary | ICD-10-CM | POA: Diagnosis not present

## 2016-12-06 DIAGNOSIS — R5383 Other fatigue: Secondary | ICD-10-CM | POA: Diagnosis not present

## 2016-12-09 DIAGNOSIS — R0600 Dyspnea, unspecified: Secondary | ICD-10-CM

## 2016-12-09 DIAGNOSIS — J441 Chronic obstructive pulmonary disease with (acute) exacerbation: Secondary | ICD-10-CM | POA: Diagnosis not present

## 2016-12-09 DIAGNOSIS — R05 Cough: Secondary | ICD-10-CM | POA: Diagnosis not present

## 2016-12-09 DIAGNOSIS — J449 Chronic obstructive pulmonary disease, unspecified: Secondary | ICD-10-CM | POA: Diagnosis not present

## 2016-12-09 DIAGNOSIS — I350 Nonrheumatic aortic (valve) stenosis: Secondary | ICD-10-CM | POA: Diagnosis not present

## 2016-12-09 DIAGNOSIS — Z7902 Long term (current) use of antithrombotics/antiplatelets: Secondary | ICD-10-CM | POA: Diagnosis not present

## 2016-12-09 DIAGNOSIS — F17210 Nicotine dependence, cigarettes, uncomplicated: Secondary | ICD-10-CM | POA: Diagnosis not present

## 2016-12-09 DIAGNOSIS — E785 Hyperlipidemia, unspecified: Secondary | ICD-10-CM

## 2016-12-09 DIAGNOSIS — Z79899 Other long term (current) drug therapy: Secondary | ICD-10-CM | POA: Diagnosis not present

## 2016-12-09 DIAGNOSIS — R0602 Shortness of breath: Secondary | ICD-10-CM | POA: Diagnosis not present

## 2016-12-09 DIAGNOSIS — R0902 Hypoxemia: Secondary | ICD-10-CM | POA: Diagnosis not present

## 2016-12-09 DIAGNOSIS — I2510 Atherosclerotic heart disease of native coronary artery without angina pectoris: Secondary | ICD-10-CM

## 2016-12-09 DIAGNOSIS — I10 Essential (primary) hypertension: Secondary | ICD-10-CM | POA: Diagnosis present

## 2016-12-11 DIAGNOSIS — R0609 Other forms of dyspnea: Secondary | ICD-10-CM | POA: Insufficient documentation

## 2016-12-11 DIAGNOSIS — R0601 Orthopnea: Secondary | ICD-10-CM

## 2016-12-11 DIAGNOSIS — R0600 Dyspnea, unspecified: Secondary | ICD-10-CM

## 2016-12-11 DIAGNOSIS — R0602 Shortness of breath: Secondary | ICD-10-CM

## 2016-12-11 HISTORY — DX: Dyspnea, unspecified: R06.00

## 2016-12-11 HISTORY — DX: Other forms of dyspnea: R06.09

## 2016-12-12 DIAGNOSIS — J441 Chronic obstructive pulmonary disease with (acute) exacerbation: Secondary | ICD-10-CM | POA: Diagnosis not present

## 2016-12-12 DIAGNOSIS — I2510 Atherosclerotic heart disease of native coronary artery without angina pectoris: Secondary | ICD-10-CM | POA: Diagnosis not present

## 2016-12-12 DIAGNOSIS — I110 Hypertensive heart disease with heart failure: Secondary | ICD-10-CM | POA: Diagnosis present

## 2016-12-12 DIAGNOSIS — Z7902 Long term (current) use of antithrombotics/antiplatelets: Secondary | ICD-10-CM | POA: Diagnosis not present

## 2016-12-12 DIAGNOSIS — R0602 Shortness of breath: Secondary | ICD-10-CM | POA: Diagnosis not present

## 2016-12-12 DIAGNOSIS — I509 Heart failure, unspecified: Secondary | ICD-10-CM | POA: Diagnosis present

## 2016-12-12 DIAGNOSIS — R0603 Acute respiratory distress: Secondary | ICD-10-CM | POA: Diagnosis not present

## 2016-12-12 DIAGNOSIS — E785 Hyperlipidemia, unspecified: Secondary | ICD-10-CM | POA: Diagnosis not present

## 2016-12-12 DIAGNOSIS — Z79899 Other long term (current) drug therapy: Secondary | ICD-10-CM | POA: Diagnosis not present

## 2016-12-12 DIAGNOSIS — I350 Nonrheumatic aortic (valve) stenosis: Secondary | ICD-10-CM | POA: Diagnosis not present

## 2016-12-12 DIAGNOSIS — M1990 Unspecified osteoarthritis, unspecified site: Secondary | ICD-10-CM | POA: Diagnosis present

## 2016-12-12 DIAGNOSIS — R0902 Hypoxemia: Secondary | ICD-10-CM | POA: Diagnosis not present

## 2016-12-12 DIAGNOSIS — F17210 Nicotine dependence, cigarettes, uncomplicated: Secondary | ICD-10-CM | POA: Diagnosis not present

## 2016-12-13 ENCOUNTER — Ambulatory Visit: Payer: Medicare Other | Admitting: Cardiology

## 2016-12-17 DIAGNOSIS — R600 Localized edema: Secondary | ICD-10-CM | POA: Diagnosis not present

## 2016-12-17 DIAGNOSIS — J984 Other disorders of lung: Secondary | ICD-10-CM | POA: Diagnosis not present

## 2016-12-17 DIAGNOSIS — Z6834 Body mass index (BMI) 34.0-34.9, adult: Secondary | ICD-10-CM | POA: Diagnosis not present

## 2016-12-20 ENCOUNTER — Encounter: Payer: Self-pay | Admitting: Cardiology

## 2016-12-20 ENCOUNTER — Ambulatory Visit (INDEPENDENT_AMBULATORY_CARE_PROVIDER_SITE_OTHER): Payer: Medicare Other | Admitting: Cardiology

## 2016-12-20 VITALS — BP 134/60 | HR 72 | Resp 14 | Ht 61.5 in | Wt 166.0 lb

## 2016-12-20 DIAGNOSIS — R0609 Other forms of dyspnea: Secondary | ICD-10-CM

## 2016-12-20 DIAGNOSIS — I10 Essential (primary) hypertension: Secondary | ICD-10-CM

## 2016-12-20 DIAGNOSIS — F17200 Nicotine dependence, unspecified, uncomplicated: Secondary | ICD-10-CM | POA: Diagnosis not present

## 2016-12-20 DIAGNOSIS — E782 Mixed hyperlipidemia: Secondary | ICD-10-CM

## 2016-12-20 DIAGNOSIS — IMO0001 Reserved for inherently not codable concepts without codable children: Secondary | ICD-10-CM

## 2016-12-20 DIAGNOSIS — I350 Nonrheumatic aortic (valve) stenosis: Secondary | ICD-10-CM

## 2016-12-20 DIAGNOSIS — J410 Simple chronic bronchitis: Secondary | ICD-10-CM

## 2016-12-20 DIAGNOSIS — E785 Hyperlipidemia, unspecified: Secondary | ICD-10-CM

## 2016-12-20 DIAGNOSIS — J449 Chronic obstructive pulmonary disease, unspecified: Secondary | ICD-10-CM

## 2016-12-20 HISTORY — DX: Chronic obstructive pulmonary disease, unspecified: J44.9

## 2016-12-20 NOTE — Patient Instructions (Addendum)
Medication Instructions:  Your physician recommends that you continue on your current medications as directed. Please refer to the Current Medication list given to you today.  Labwork: Your physician recommends that you have lab work today to check your proBNP and Kidney function as well as sodium level and potassium.   Testing/Procedures: None   Follow-Up: Your physician recommends that you schedule a follow-up appointment in: 1 month   Any Other Special Instructions Will Be Listed Below (If Applicable).  Please note that any paperwork needing to be filled out by the provider will need to be addressed at the front desk prior to seeing the provider. Please note that any paperwork FMLA, Disability or other documents regarding health condition is subject to a $25.00 charge that must be received prior to completion of paperwork in the form of a money order or check.    If you need a refill on your cardiac medications before your next appointment, please call your pharmacy.

## 2016-12-20 NOTE — Progress Notes (Signed)
Cardiology Office Note:    Date:  12/20/2016   ID:  ALMYRA BIRMAN, DOB 1927/03/10, MRN [id number]  PCP:  Angelina Sheriff, MD  Cardiologist:  Jenne Campus, MD    Referring MD: Angelina Sheriff, MD   Chief Complaint  Patient presents with  . Hospitalization Follow-up  I am doing better  History of Present Illness:    Annette Huang is a 83 y.o. female with recent admission to hospital.  She does have aortic stenosis and last estimation that I have is from October of this year when she had echocardiogram done in the hospital and there is some discrepancy in the report.  Valve area is critical however the gradient is only mild.  She says she is feeling much better after being discharged from hospital shortness of breath he still but they are with exertion but nothing compared to what brought him to the hospital.  She is able to maintain activities of daily living luckily she quit smoking on November 8.  No dizziness no passing out there is no exertional tightness squeezing pressure burning chest.  Past Medical History:  Diagnosis Date  . COPD (chronic obstructive pulmonary disease) ([place])   . Coronary artery disease   . CVA (cerebral vascular accident) ([place])   . Hyperlipidemia   . Hypertension   . Thoracic aortic atherosclerosis [facility])     Past Surgical History:  Procedure Laterality Date  . NECK SURGERY      Current Medications: Current Meds  Medication Sig  . amLODipine (NORVASC) 5 MG tablet Take 5 mg daily by mouth.  Marland Kitchen atorvastatin (LIPITOR) 20 MG tablet Take 20 mg daily by mouth.  . clopidogrel (PLAVIX) 75 MG tablet Take 75 mg daily by mouth.  . enalapril (VASOTEC) 20 MG tablet Take 20 mg daily by mouth.  . furosemide (LASIX) 40 MG tablet Take 40 mg daily by mouth.  . levothyroxine (SYNTHROID, LEVOTHROID) 50 MCG tablet Take 1 tablet daily by mouth.     Allergies:   Codeine   Social History   Socioeconomic History  . Marital status: Married    Spouse  name: None  . Number of children: None  . Years of education: None  . Highest education level: None  Social Needs  . Financial resource strain: None  . Food insecurity - worry: None  . Food insecurity - inability: None  . Transportation needs - medical: None  . Transportation needs - non-medical: None  Occupational History  . None  Tobacco Use  . Smoking status: Former Research scientist (life sciences)  . Smokeless tobacco: Never Used  Substance and Sexual Activity  . Alcohol use: No    Frequency: Never  . Drug use: No  . Sexual activity: None  Other Topics Concern  . None  Social History Narrative  . None     Family History: The patient's family history includes Heart disease in her father and mother; Hypertension in her mother. ROS:   Please see the history of present illness.    All 14 point review of systems negative except as described per history of present illness  EKGs/Labs/Other Studies Reviewed:      Recent Labs: No results found for requested labs within last 8760 hours.  Recent Lipid Panel No results found for: CHOL, TRIG, HDL, CHOLHDL, VLDL, LDLCALC, LDLDIRECT  Physical Exam:    VS:  BP 134/60   Pulse 72   Resp 14   Ht 5' 1.5" (1.562 m)   Wt 166  lb (75.3 kg)   BMI 30.86 kg/m     Wt Readings from Last 3 Encounters:  12/20/16 166 lb (75.3 kg)     GEN:  Well nourished, well developed in no acute distress HEENT: Normal NECK: No JVD; No carotid bruits LYMPHATICS: No lymphadenopathy CARDIAC: RRR, systolic murmur grade 2/6 to 3/6 best heard at the right upper portion of the sternum, S2 is still audible, no rubs, no gallops RESPIRATORY:  Clear to auscultation without rales, wheezing or rhonchi  ABDOMEN: Soft, non-tender, non-distended MUSCULOSKELETAL:  No edema; No deformity  SKIN: Warm and dry LOWER EXTREMITIES: no swelling NEUROLOGIC:  Alert and oriented x 3 PSYCHIATRIC:  Normal affect   ASSESSMENT:    1. Hyperlipidemia, unspecified hyperlipidemia type   2.  Hypertension, unspecified type   3. Nonrheumatic aortic valve stenosis   4. Essential hypertension   5. Dyspnea on exertion   6. Mixed hyperlipidemia   7. Smoking   8. Simple chronic bronchitis (HCC)    PLAN:    In order of problems listed above:  1. Aortic stenosis: On auscultation I do not think this is a critical valve stenosis will retrieve her echocardiogram from May, what may be concern is reported to have from hospital that is inconsistent she may require another echocardiogram to clarify that. 2. COPD: Appears to be controlled right now.  She quit smoking which is fantastic and I congratulated her for it. 3. Essential hypertension: We will continue present management blood pressure well controlled 4. Smoking: Again she quit just a few days ago and encouraged her to stay away from smoking 5. Dyslipidemia: On statin which I will continue  I asked her to have Chem-7 as well as proBNP done the purpose of his strength to see what portion of her symptoms are related to COPD and what part of it is because of her valve and CHF   Medication Adjustments/Labs and Tests Ordered: Current medicines are reviewed at length with the patient today.  Concerns regarding medicines are outlined above.  Orders Placed This Encounter  Procedures  . Basic metabolic panel  . Pro b natriuretic peptide (BNP)   Medication changes: No orders of the defined types were placed in this encounter.   Signed, Park Liter, MD, [facility] 12/20/2016 12:21 PM    [place]

## 2016-12-21 LAB — BASIC METABOLIC PANEL
BUN / CREAT RATIO: 20 (ref 12–28)
BUN: 28 mg/dL — AB (ref 8–27)
CO2: 29 mmol/L (ref 20–29)
CREATININE: 1.39 mg/dL — AB (ref 0.57–1.00)
Calcium: 9.7 mg/dL (ref 8.7–10.3)
Chloride: 102 mmol/L (ref 96–106)
GFR, EST AFRICAN AMERICAN: 40 mL/min/{1.73_m2} — AB (ref >59)
GFR, EST NON AFRICAN AMERICAN: 34 mL/min/{1.73_m2} — AB (ref >59)
GLUCOSE: 125 mg/dL — AB (ref 65–99)
Potassium: 4.4 mmol/L (ref 3.5–5.2)
Sodium: 145 mmol/L — ABNORMAL HIGH (ref 134–144)

## 2016-12-21 LAB — PRO B NATRIURETIC PEPTIDE: NT-Pro BNP: 543 pg/mL (ref 0–738)

## 2017-01-09 ENCOUNTER — Ambulatory Visit (INDEPENDENT_AMBULATORY_CARE_PROVIDER_SITE_OTHER): Payer: Medicare Other | Admitting: Pulmonary Disease

## 2017-01-09 ENCOUNTER — Encounter: Payer: Self-pay | Admitting: Pulmonary Disease

## 2017-01-09 VITALS — BP 132/76 | HR 87 | Ht 60.5 in | Wt 167.2 lb

## 2017-01-09 DIAGNOSIS — R0602 Shortness of breath: Secondary | ICD-10-CM

## 2017-01-09 NOTE — Patient Instructions (Signed)
Continue using albuterol inhaler as needed.  Will schedule you for pulmonary function test in Jan-February Follow-up in clinic after test.
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
## 2017-01-20 ENCOUNTER — Encounter: Payer: Self-pay | Admitting: Cardiology

## 2017-01-20 ENCOUNTER — Ambulatory Visit (INDEPENDENT_AMBULATORY_CARE_PROVIDER_SITE_OTHER): Payer: Medicare Other | Admitting: Cardiology

## 2017-01-20 VITALS — BP 140/72 | HR 88 | Ht 60.5 in | Wt 168.0 lb

## 2017-01-20 DIAGNOSIS — I6990 Unspecified sequelae of unspecified cerebrovascular disease: Secondary | ICD-10-CM | POA: Diagnosis not present

## 2017-01-20 DIAGNOSIS — R0609 Other forms of dyspnea: Secondary | ICD-10-CM | POA: Diagnosis not present

## 2017-01-20 DIAGNOSIS — I350 Nonrheumatic aortic (valve) stenosis: Secondary | ICD-10-CM

## 2017-01-20 DIAGNOSIS — I10 Essential (primary) hypertension: Secondary | ICD-10-CM | POA: Diagnosis not present

## 2017-01-20 NOTE — Addendum Note (Signed)
Addended by: Aleatha Borer on: 01/20/2017 02:43 PM   Modules accepted: Orders

## 2017-01-20 NOTE — Patient Instructions (Signed)
Medication Instructions:  Your physician recommends that you continue on your current medications as directed. Please refer to the Current Medication list given to you today.  Labwork: Your physician recommends that you have lab work today: BMP and BNP, to check your kidney function, electrolytes and any fluid that you may have on your heart/lungs  Testing/Procedures: Your physician has requested that you have an echocardiogram. Echocardiography is a painless test that uses sound waves to create images of your heart. It provides your doctor with information about the size and shape of your heart and how well your heart's chambers and valves are working. This procedure takes approximately one hour. There are no restrictions for this procedure.  Your physician has requested that you have a carotid duplex. This test is an ultrasound of the carotid arteries in your neck. It looks at blood flow through these arteries that supply the brain with blood. Allow one hour for this exam. There are no restrictions or special instructions.  Follow-Up: Your physician recommends that you schedule a follow-up appointment in: 1 month with Dr. Agustin Cree   Any Other Special Instructions Will Be Listed Below (If Applicable).     If you need a refill on your cardiac medications before your next appointment, please call your pharmacy.

## 2017-01-20 NOTE — Progress Notes (Signed)
Cardiology Office Note:    Date:  01/20/2017   ID:  Annette Huang, DOB Nov 11, 1927, MRN [id number]  PCP:  Angelina Sheriff, MD  Cardiologist:  Jenne Campus, MD    Referring MD: Angelina Sheriff, MD   Chief Complaint  Patient presents with  . Follow-up  I am doing fine  History of Present Illness:    Annette Huang is a 83 y.o. female with aortic stenosis she was in the hospital more than a month ago because of decompensated congestive heart failure.  She was successfully managed with better diuresis and see since that time she seems to be doing well.  Plan of having some swelling of lower extremities at evening time that bothers her a lot.  Did review her echocardiogram from hospital.  And there is some discrepancy in the report we may be dealing with low flow low gradient aortic stenosis.  She is establishing a firm diagnosis of aortic stenosis critical in this management I will ask you to have another echocardiogram done.  An evaluation will be to look for low gradient low flow aortic stenosis.  Past Medical History:  Diagnosis Date  . COPD (chronic obstructive pulmonary disease) ([place])   . Coronary artery disease   . CVA (cerebral vascular accident) ([place])   . Hyperlipidemia   . Hypertension   . Thoracic aortic atherosclerosis [facility])     Past Surgical History:  Procedure Laterality Date  . NECK SURGERY      Current Medications: Current Meds  Medication Sig  . Albuterol Sulfate (VENTOLIN HFA IN) Inhale 2 puffs into the lungs every 6 (six) hours as needed.  Marland Kitchen amLODipine (NORVASC) 5 MG tablet Take 5 mg daily by mouth.  Marland Kitchen atorvastatin (LIPITOR) 20 MG tablet Take 20 mg daily by mouth.  . clopidogrel (PLAVIX) 75 MG tablet Take 75 mg daily by mouth.  . enalapril (VASOTEC) 20 MG tablet Take 20 mg daily by mouth.  . furosemide (LASIX) 40 MG tablet Take 40 mg daily by mouth.  . levothyroxine (SYNTHROID, LEVOTHROID) 50 MCG tablet Take 1 tablet daily by mouth.      Allergies:   Codeine   Social History   Socioeconomic History  . Marital status: Married    Spouse name: None  . Number of children: None  . Years of education: None  . Highest education level: None  Social Needs  . Financial resource strain: None  . Food insecurity - worry: None  . Food insecurity - inability: None  . Transportation needs - medical: None  . Transportation needs - non-medical: None  Occupational History  . None  Tobacco Use  . Smoking status: Former Smoker    Packs/day: 1.00    Years: 35.00    Pack years: 35.00  . Smokeless tobacco: Never Used  . Tobacco comment: 12/09/16  Substance and Sexual Activity  . Alcohol use: No    Frequency: Never  . Drug use: No  . Sexual activity: None  Other Topics Concern  . None  Social History Narrative  . None     Family History: The patient's family history includes Heart disease in her father and mother; Hypertension in her mother. ROS:   Please see the history of present illness.    All 14 point review of systems negative except as described per history of present illness  EKGs/Labs/Other Studies Reviewed:      Recent Labs: 12/20/2016: BUN 28; Creatinine, Ser 1.39; NT-Pro BNP 543; Potassium 4.4;  Sodium 145  Recent Lipid Panel No results found for: CHOL, TRIG, HDL, CHOLHDL, VLDL, LDLCALC, LDLDIRECT  Physical Exam:    VS:  BP 140/72 (BP Location: Right Arm, Patient Position: Sitting, Cuff Size: Normal)   Pulse 88   Ht 5' 0.5" (1.537 m)   Wt 168 lb (76.2 kg)   SpO2 96%   BMI 32.27 kg/m     Wt Readings from Last 3 Encounters:  01/20/17 168 lb (76.2 kg)  01/09/17 167 lb 3.2 oz (75.8 kg)  12/20/16 166 lb (75.3 kg)     GEN:  Well nourished, well developed in no acute distress HEENT: Normal NECK: No JVD; No carotid bruits LYMPHATICS: No lymphadenopathy CARDIAC: RRR, systolic ejection murmur grade 3/6 with some late peaking.  S2 is still present.  Patient towards the neck., no rubs, no  gallops RESPIRATORY:  Clear to auscultation without rales, wheezing or rhonchi  ABDOMEN: Soft, non-tender, non-distended MUSCULOSKELETAL:  No edema; No deformity  SKIN: Warm and dry LOWER EXTREMITIES: no swelling NEUROLOGIC:  Alert and oriented x 3 PSYCHIATRIC:  Normal affect   ASSESSMENT:    1. Nonrheumatic aortic valve stenosis   2. Essential hypertension   3. Dyspnea on exertion   4. Late effects of CVA (cerebrovascular accident)    PLAN:    In order of problems listed above:  1. Nonrheumatic aortic stenosis: We will ask you to have repeated echocardiogram to check for degree of narrowing. 2. Essential hypertension: Seems to be reasonably controlled we will not alter any of medications right now until we get report of her echocardiogram. 3. Dyspnea on exertion: She admits that she does not do much during the summer she walks outside but now because of cold weather she does not do it. 4. History of CVA: Denies having any. 5. History of peripheral vascular disease status post right carotid endarterectomy.  We need to repeat her carotid ultrasound.   Medication Adjustments/Labs and Tests Ordered: Current medicines are reviewed at length with the patient today.  Concerns regarding medicines are outlined above.  No orders of the defined types were placed in this encounter.  Medication changes: No orders of the defined types were placed in this encounter.   Signed, Park Liter, MD, [facility] 01/20/2017 2:18 PM    [facility]

## 2017-01-21 LAB — BASIC METABOLIC PANEL
BUN / CREAT RATIO: 15 (ref 12–28)
BUN: 17 mg/dL (ref 8–27)
CHLORIDE: 102 mmol/L (ref 96–106)
CO2: 30 mmol/L — AB (ref 20–29)
CREATININE: 1.17 mg/dL — AB (ref 0.57–1.00)
Calcium: 9.5 mg/dL (ref 8.7–10.3)
GFR calc Af Amer: 48 mL/min/{1.73_m2} — ABNORMAL LOW (ref >59)
GFR calc non Af Amer: 42 mL/min/{1.73_m2} — ABNORMAL LOW (ref >59)
GLUCOSE: 105 mg/dL — AB (ref 65–99)
Potassium: 3.9 mmol/L (ref 3.5–5.2)
SODIUM: 144 mmol/L (ref 134–144)

## 2017-01-21 LAB — PRO B NATRIURETIC PEPTIDE: NT-PRO BNP: 393 pg/mL (ref 0–738)

## 2017-02-13 ENCOUNTER — Ambulatory Visit (HOSPITAL_BASED_OUTPATIENT_CLINIC_OR_DEPARTMENT_OTHER)
Admission: RE | Admit: 2017-02-13 | Discharge: 2017-02-13 | Disposition: A | Discharge status: Home / Self Care | Payer: Medicare Other | Source: Ambulatory Visit | Attending: Cardiology | Admitting: Cardiology

## 2017-02-13 ENCOUNTER — Ambulatory Visit (HOSPITAL_BASED_OUTPATIENT_CLINIC_OR_DEPARTMENT_OTHER)
Admission: RE | Admit: 2017-02-13 | Discharge: 2017-02-13 | Disposition: A | Discharge status: Home / Self Care | Payer: Medicare Other | Source: Ambulatory Visit | Attending: Family Medicine | Admitting: Family Medicine

## 2017-02-13 DIAGNOSIS — I350 Nonrheumatic aortic (valve) stenosis: Secondary | ICD-10-CM

## 2017-02-13 DIAGNOSIS — I6990 Unspecified sequelae of unspecified cerebrovascular disease: Secondary | ICD-10-CM | POA: Diagnosis not present

## 2017-02-13 DIAGNOSIS — J90 Pleural effusion, not elsewhere classified: Secondary | ICD-10-CM | POA: Insufficient documentation

## 2017-02-13 DIAGNOSIS — R0609 Other forms of dyspnea: Secondary | ICD-10-CM | POA: Diagnosis not present

## 2017-02-13 DIAGNOSIS — I060 Rheumatic aortic stenosis: Secondary | ICD-10-CM | POA: Diagnosis not present

## 2017-02-13 DIAGNOSIS — I10 Essential (primary) hypertension: Secondary | ICD-10-CM

## 2017-02-13 LAB — VAS US CAROTID
LCCADSYS: 58 cm/s
LCCAPDIAS: 13 cm/s
LCCAPSYS: 72 cm/s
LEFT ECA DIAS: -6 cm/s
LEFT VERTEBRAL DIAS: -14 cm/s
LICAPDIAS: -21 cm/s
Left CCA dist dias: 9 cm/s
Left ICA dist dias: -18 cm/s
Left ICA dist sys: -68 cm/s
Left ICA prox sys: -124 cm/s
RCCADSYS: -114 cm/s
RCCAPDIAS: 14 cm/s
RCCAPSYS: 75 cm/s
RIGHT ECA DIAS: -8 cm/s
RIGHT VERTEBRAL DIAS: -7 cm/s

## 2017-02-13 NOTE — Progress Notes (Signed)
Bilateral Carotid Duplex  Cardell Peach 02/13/2017

## 2017-02-13 NOTE — Progress Notes (Signed)
  Echocardiogram 2D Echocardiogram has been performed.  Annette Huang T Annette Huang 02/13/2017, 10:18 AM

## 2017-02-17 ENCOUNTER — Ambulatory Visit (INDEPENDENT_AMBULATORY_CARE_PROVIDER_SITE_OTHER): Payer: Medicare Other | Admitting: Pulmonary Disease

## 2017-02-17 ENCOUNTER — Encounter: Payer: Self-pay | Admitting: Pulmonary Disease

## 2017-02-17 VITALS — BP 147/70 | HR 88 | Ht 61.0 in | Wt 168.0 lb

## 2017-02-17 DIAGNOSIS — J439 Emphysema, unspecified: Secondary | ICD-10-CM | POA: Diagnosis not present

## 2017-02-17 DIAGNOSIS — R0602 Shortness of breath: Secondary | ICD-10-CM | POA: Diagnosis not present

## 2017-02-17 LAB — PULMONARY FUNCTION TEST
DL/VA % PRED: 88 %
DL/VA: 3.45 ml/min/mmHg/L
DLCO COR % PRED: 73 %
DLCO COR: 11.94 ml/min/mmHg
DLCO unc % pred: 71 %
DLCO unc: 11.64 ml/min/mmHg
FEF 25-75 Post: 1.63 L/sec
FEF 25-75 Pre: 1.05 L/sec
FEF2575-%CHANGE-POST: 56 %
FEF2575-%PRED-PRE: 147 %
FEF2575-%Pred-Post: 229 %
FEV1-%Change-Post: 14 %
FEV1-%Pred-Post: 118 %
FEV1-%Pred-Pre: 103 %
FEV1-POST: 1.35 L
FEV1-Pre: 1.18 L
FEV1FVC-%CHANGE-POST: 4 %
FEV1FVC-%Pred-Pre: 109 %
FEV6-%Change-Post: 9 %
FEV6-%PRED-PRE: 101 %
FEV6-%Pred-Post: 110 %
FEV6-POST: 1.62 L
FEV6-PRE: 1.47 L
FEV6FVC-%Change-Post: -1 %
FEV6FVC-%PRED-POST: 107 %
FEV6FVC-%Pred-Pre: 108 %
FVC-%CHANGE-POST: 9 %
FVC-%PRED-POST: 103 %
FVC-%PRED-PRE: 94 %
FVC-POST: 1.64 L
FVC-PRE: 1.5 L
POST FEV6/FVC RATIO: 99 %
Post FEV1/FVC ratio: 82 %
Pre FEV1/FVC ratio: 79 %
Pre FEV6/FVC Ratio: 100 %
RV % pred: 94 %
RV: 2.12 L
TLC % PRED: 93 %
TLC: 3.9 L

## 2017-02-17 NOTE — Patient Instructions (Signed)
I have reviewed your pulmonary function test.  They show very minimal changes of early COPD Continue to use albuterol as needed You can return to pulmonary clinic as needed.

## 2017-02-17 NOTE — Progress Notes (Signed)
PFT done today. 
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
## 2017-02-24 ENCOUNTER — Encounter: Payer: Self-pay | Admitting: Cardiology

## 2017-02-24 ENCOUNTER — Ambulatory Visit (INDEPENDENT_AMBULATORY_CARE_PROVIDER_SITE_OTHER): Payer: Medicare Other | Admitting: Cardiology

## 2017-02-24 VITALS — BP 120/70 | HR 75 | Ht 61.0 in | Wt 167.0 lb

## 2017-02-24 DIAGNOSIS — I10 Essential (primary) hypertension: Secondary | ICD-10-CM

## 2017-02-24 DIAGNOSIS — I350 Nonrheumatic aortic (valve) stenosis: Secondary | ICD-10-CM | POA: Diagnosis not present

## 2017-02-24 DIAGNOSIS — E782 Mixed hyperlipidemia: Secondary | ICD-10-CM

## 2017-02-24 DIAGNOSIS — I6990 Unspecified sequelae of unspecified cerebrovascular disease: Secondary | ICD-10-CM

## 2017-02-24 NOTE — Patient Instructions (Signed)
Medication Instructions:  Your physician recommends that you continue on your current medications as directed. Please refer to the Current Medication list given to you today.  Labwork: None ordered  Testing/Procedures: None ordered  Follow-Up: Your physician recommends that you schedule a follow-up appointment in: 3 months with Dr. Krasowski   Any Other Special Instructions Will Be Listed Below (If Applicable).     If you need a refill on your cardiac medications before your next appointment, please call your pharmacy.   

## 2017-02-24 NOTE — Progress Notes (Signed)
Cardiology Office Note:    Date:  02/24/2017   ID:  Annette Huang, DOB 1927/02/10, MRN [id number]  PCP:  Angelina Sheriff, MD  Cardiologist:  Jenne Campus, MD    Referring MD: Angelina Sheriff, MD   Chief Complaint  Patient presents with  . Follow-up  Doing well  History of Present Illness:    Annette Huang is a 84 y.o. female with aortic stenosis.  She is debating about severity of this lesion.  She had 2 echocardiogram done before with conflicting information however last echocardiogram also does not clarify degree of aortic stenosis.  Mean and peak gradient low but aortic valve area is critical.  Overall it was assessed as moderate however I need to review echocardiogram by myself to make a diagnosis.  Overall she seems to be doing well denies having any unusual shortness of breath no dizziness passing out.  Shortness of breath this is usual.  No chest pain.  Past Medical History:  Diagnosis Date  . COPD (chronic obstructive pulmonary disease) ([place])   . Coronary artery disease   . CVA (cerebral vascular accident) ([place])   . Hyperlipidemia   . Hypertension   . Thoracic aortic atherosclerosis [facility])     Past Surgical History:  Procedure Laterality Date  . NECK SURGERY      Current Medications: Current Meds  Medication Sig  . Albuterol Sulfate (VENTOLIN HFA IN) Inhale 2 puffs into the lungs every 6 (six) hours as needed.  Marland Kitchen amLODipine (NORVASC) 5 MG tablet Take 5 mg daily by mouth.  Marland Kitchen atorvastatin (LIPITOR) 20 MG tablet Take 20 mg daily by mouth.  . clopidogrel (PLAVIX) 75 MG tablet Take 75 mg daily by mouth.  . enalapril (VASOTEC) 20 MG tablet Take 20 mg daily by mouth.  . furosemide (LASIX) 40 MG tablet Take 40 mg daily by mouth.  . levothyroxine (SYNTHROID, LEVOTHROID) 50 MCG tablet Take 1 tablet daily by mouth.     Allergies:   Codeine   Social History   Socioeconomic History  . Marital status: Married    Spouse name: None  . Number of children:  None  . Years of education: None  . Highest education level: None  Social Needs  . Financial resource strain: None  . Food insecurity - worry: None  . Food insecurity - inability: None  . Transportation needs - medical: None  . Transportation needs - non-medical: None  Occupational History  . None  Tobacco Use  . Smoking status: Former Smoker    Packs/day: 1.00    Years: 35.00    Pack years: 35.00  . Smokeless tobacco: Never Used  . Tobacco comment: 12/09/16  Substance and Sexual Activity  . Alcohol use: No    Frequency: Never  . Drug use: No  . Sexual activity: None  Other Topics Concern  . None  Social History Narrative  . None     Family History: The patient's family history includes Heart disease in her father and mother; Hypertension in her mother. ROS:   Please see the history of present illness.    All 14 point review of systems negative except as described per history of present illness  EKGs/Labs/Other Studies Reviewed:      Recent Labs: 01/20/2017: BUN 17; Creatinine, Ser 1.17; NT-Pro BNP 393; Potassium 3.9; Sodium 144  Recent Lipid Panel No results found for: CHOL, TRIG, HDL, CHOLHDL, VLDL, LDLCALC, LDLDIRECT  Physical Exam:    VS:  BP 120/70 (  BP Location: Right Arm, Patient Position: Sitting, Cuff Size: Normal)   Pulse 75   Ht 5\' 1"  (1.549 m)   Wt 167 lb (75.8 kg)   SpO2 96%   BMI 31.55 kg/m     Wt Readings from Last 3 Encounters:  02/24/17 167 lb (75.8 kg)  02/17/17 168 lb (76.2 kg)  01/20/17 168 lb (76.2 kg)     GEN:  Well nourished, well developed in no acute distress HEENT: Normal NECK: No JVD; No carotid bruits LYMPHATICS: No lymphadenopathy CARDIAC: RRR, systolic ejection murmur grade 3/6 best heard at the right upper portion of the sternum S2 is still present, no rubs, no gallops RESPIRATORY:  Clear to auscultation without rales, wheezing or rhonchi  ABDOMEN: Soft, non-tender, non-distended MUSCULOSKELETAL:  No edema; No deformity    SKIN: Warm and dry LOWER EXTREMITIES: no swelling NEUROLOGIC:  Alert and oriented x 3 PSYCHIATRIC:  Normal affect   ASSESSMENT:    1. Nonrheumatic aortic valve stenosis   2. Essential hypertension   3. Mixed hyperlipidemia   4. Late effects of CVA (cerebrovascular accident)    PLAN:    In order of problems listed above:  1. Aortic stenosis I will review echocardiogram myself try to determine if we dealing with significant stenosis I approach her with idea that she may require surgery or TAVR she said if she needs this she would consider.  Discussed in length about the implication of severe aortic stenosis and limited activity of people with this lesion.  She still said if she required some intervention she would be willing to consider.  Therefore, I think it is critically essential to assess degree of aortic stenosis in her case. 2. Social hypertension: Blood pressure appears to be stable continue present management. 3. Dyslipidemia: On statin which I will continue. 4. Late effect of CVA stable.   Medication Adjustments/Labs and Tests Ordered: Current medicines are reviewed at length with the patient today.  Concerns regarding medicines are outlined above.  No orders of the defined types were placed in this encounter.  Medication changes: No orders of the defined types were placed in this encounter.   Signed, Park Liter, MD, [facility] 02/24/2017 3:07 PM    [place]

## 2017-03-05 DIAGNOSIS — M47816 Spondylosis without myelopathy or radiculopathy, lumbar region: Secondary | ICD-10-CM | POA: Diagnosis not present

## 2017-03-05 DIAGNOSIS — M5136 Other intervertebral disc degeneration, lumbar region: Secondary | ICD-10-CM | POA: Diagnosis not present

## 2017-03-05 DIAGNOSIS — M5416 Radiculopathy, lumbar region: Secondary | ICD-10-CM | POA: Diagnosis not present

## 2017-03-12 ENCOUNTER — Other Ambulatory Visit: Payer: Self-pay

## 2017-03-12 DIAGNOSIS — I350 Nonrheumatic aortic (valve) stenosis: Secondary | ICD-10-CM

## 2017-03-18 DIAGNOSIS — Z1231 Encounter for screening mammogram for malignant neoplasm of breast: Secondary | ICD-10-CM | POA: Diagnosis not present

## 2017-04-01 ENCOUNTER — Telehealth: Payer: Self-pay

## 2017-04-01 NOTE — Telephone Encounter (Signed)
Left message with pts daughter regarding Dr. Wendy Poet suggestion to have calcium score testing. Daughter states she will notify the pt.

## 2017-04-22 DIAGNOSIS — H1045 Other chronic allergic conjunctivitis: Secondary | ICD-10-CM | POA: Diagnosis not present

## 2017-04-22 DIAGNOSIS — H1032 Unspecified acute conjunctivitis, left eye: Secondary | ICD-10-CM | POA: Diagnosis not present

## 2017-05-26 ENCOUNTER — Ambulatory Visit (INDEPENDENT_AMBULATORY_CARE_PROVIDER_SITE_OTHER): Payer: Medicare Other | Admitting: Cardiology

## 2017-05-26 ENCOUNTER — Encounter: Payer: Self-pay | Admitting: Cardiology

## 2017-05-26 VITALS — BP 134/66 | HR 62 | Ht 61.0 in | Wt 164.0 lb

## 2017-05-26 DIAGNOSIS — I350 Nonrheumatic aortic (valve) stenosis: Secondary | ICD-10-CM

## 2017-05-26 DIAGNOSIS — I10 Essential (primary) hypertension: Secondary | ICD-10-CM

## 2017-05-26 DIAGNOSIS — R0609 Other forms of dyspnea: Secondary | ICD-10-CM

## 2017-05-26 DIAGNOSIS — E782 Mixed hyperlipidemia: Secondary | ICD-10-CM

## 2017-05-26 NOTE — Patient Instructions (Signed)
Medication Instructions:  Your physician recommends that you continue on your current medications as directed. Please refer to the Current Medication list given to you today.   Labwork: Your physician recommends that you return for lab work today: BMP.  Testing/Procedures: Your physician has requested that you have cardiac CT. Cardiac computed tomography (CT) is a painless test that uses an x-ray machine to take clear, detailed pictures of your heart. For further information please visit [facility]. Please follow instruction sheet as given. You will be contacted to schedule this test.   Follow-Up: Your physician wants you to follow-up in: 3 months. You will receive a reminder letter in the mail two months in advance. If you don't receive a letter, please call our office to schedule the follow-up appointment.   Any Other Special Instructions Will Be Listed Below (If Applicable).     If you need a refill on your cardiac medications before your next appointment, please call your pharmacy.

## 2017-05-26 NOTE — Progress Notes (Signed)
Cardiology Office Note:    Date:  05/26/2017   ID:  BOB EASTWOOD, DOB 06-03-1927, MRN [id number]  PCP:  Angelina Sheriff, MD  Cardiologist:  Jenne Campus, MD    Referring MD: Angelina Sheriff, MD   Chief Complaint  Patient presents with  . Follow-up  Doing well  History of Present Illness:    TAMETHA BANNING is a 84 y.o. female with aortic stenosis which appears to be significant interestingly she is asymptomatic she is a very active she went to [employer] walk all day with no difficulties she is actually rushing to get home today because she need to plan her flowers.  Denies having any chest pain tightness squeezing pressure burning chest no significant exertional shortness of breath but as usual.  We had a long discussion about what to do with the situation her aortic valve was various less than 0.8 however gradient is peak only 30.  Stroke volume is low.  I recommend to have a CT calcium score of the aortic valve to determine how significant the valve is.  Try to do it in [facility] as she wished if not we will do it in [facility].  I told her however if valve will have significant amount of calcium then cardiac catheterization will be warranted.  I also asked her directly if she required valve surgery she is willing to do it she said yes.  Past Medical History:  Diagnosis Date  . COPD (chronic obstructive pulmonary disease) ([place])   . Coronary artery disease   . CVA (cerebral vascular accident) ([place])   . Hyperlipidemia   . Hypertension   . Thoracic aortic atherosclerosis [facility])     Past Surgical History:  Procedure Laterality Date  . NECK SURGERY      Current Medications: Current Meds  Medication Sig  . Albuterol Sulfate (VENTOLIN HFA IN) Inhale 2 puffs into the lungs every 6 (six) hours as needed.  Marland Kitchen amLODipine (NORVASC) 5 MG tablet Take 5 mg daily by mouth.  Marland Kitchen atorvastatin (LIPITOR) 20 MG tablet Take 20 mg daily by mouth.  . clopidogrel (PLAVIX)  75 MG tablet Take 75 mg daily by mouth.  . enalapril (VASOTEC) 20 MG tablet Take 20 mg daily by mouth.  . furosemide (LASIX) 40 MG tablet Take 40 mg daily by mouth.  . levothyroxine (SYNTHROID, LEVOTHROID) 50 MCG tablet Take 1 tablet daily by mouth.     Allergies:   Codeine   Social History   Socioeconomic History  . Marital status: Married    Spouse name: Not on file  . Number of children: Not on file  . Years of education: Not on file  . Highest education level: Not on file  Occupational History  . Not on file  Social Needs  . Financial resource strain: Not on file  . Food insecurity:    Worry: Not on file    Inability: Not on file  . Transportation needs:    Medical: Not on file    Non-medical: Not on file  Tobacco Use  . Smoking status: Former Smoker    Packs/day: 1.00    Years: 35.00    Pack years: 35.00  . Smokeless tobacco: Never Used  . Tobacco comment: 12/09/16  Substance and Sexual Activity  . Alcohol use: No    Frequency: Never  . Drug use: No  . Sexual activity: Not on file  Lifestyle  . Physical activity:    Days per  week: Not on file    Minutes per session: Not on file  . Stress: Not on file  Relationships  . Social connections:    Talks on phone: Not on file    Gets together: Not on file    Attends religious service: Not on file    Active member of club or organization: Not on file    Attends meetings of clubs or organizations: Not on file    Relationship status: Not on file  Other Topics Concern  . Not on file  Social History Narrative  . Not on file     Family History: The patient's family history includes Heart disease in her father and mother; Hypertension in her mother. ROS:   Please see the history of present illness.    All 14 point review of systems negative except as described per history of present illness  EKGs/Labs/Other Studies Reviewed:      Recent Labs: 01/20/2017: BUN 17; Creatinine, Ser 1.17; NT-Pro BNP 393; Potassium  3.9; Sodium 144  Recent Lipid Panel No results found for: CHOL, TRIG, HDL, CHOLHDL, VLDL, LDLCALC, LDLDIRECT  Physical Exam:    VS:  BP 134/66   Pulse 62   Ht 5\' 1"  (1.549 m)   Wt 164 lb (74.4 kg)   SpO2 95%   BMI 30.99 kg/m     Wt Readings from Last 3 Encounters:  05/26/17 164 lb (74.4 kg)  02/24/17 167 lb (75.8 kg)  02/17/17 168 lb (76.2 kg)     GEN:  Well nourished, well developed in no acute distress HEENT: Normal NECK: No JVD; No carotid bruits LYMPHATICS: No lymphadenopathy CARDIAC: RRR, systolic ejection murmur grade 3/6 best heard at the right upper portion of the sternum, no rubs, no gallops RESPIRATORY:  Clear to auscultation without rales, wheezing or rhonchi  ABDOMEN: Soft, non-tender, non-distended MUSCULOSKELETAL:  No edema; No deformity  SKIN: Warm and dry LOWER EXTREMITIES: no swelling NEUROLOGIC:  Alert and oriented x 3 PSYCHIATRIC:  Normal affect   ASSESSMENT:    1. Nonrheumatic aortic valve stenosis   2. Essential hypertension   3. Mixed hyperlipidemia   4. Dyspnea on exertion    PLAN:    In order of problems listed above:  1. Nonrheumatic aortic valve stenosis: Plan as outlined above will be CT with a calcium score of the aortic valve 2. Essential hypertension: Blood pressure well controlled continue present management 3. Dyslipidemia: We will continue present management 4. Dyspnea on exertion stable   Medication Adjustments/Labs and Tests Ordered: Current medicines are reviewed at length with the patient today.  Concerns regarding medicines are outlined above.  No orders of the defined types were placed in this encounter.  Medication changes: No orders of the defined types were placed in this encounter.   Signed, Park Liter, MD, [facility] 05/26/2017 2:28 PM    [place]

## 2017-05-27 LAB — BASIC METABOLIC PANEL
BUN / CREAT RATIO: 13 (ref 12–28)
BUN: 14 mg/dL (ref 8–27)
CHLORIDE: 102 mmol/L (ref 96–106)
CO2: 29 mmol/L (ref 20–29)
Calcium: 9.6 mg/dL (ref 8.7–10.3)
Creatinine, Ser: 1.11 mg/dL — ABNORMAL HIGH (ref 0.57–1.00)
GFR calc non Af Amer: 45 mL/min/{1.73_m2} — ABNORMAL LOW (ref >59)
GFR, EST AFRICAN AMERICAN: 52 mL/min/{1.73_m2} — AB (ref >59)
GLUCOSE: 131 mg/dL — AB (ref 65–99)
POTASSIUM: 4 mmol/L (ref 3.5–5.2)
SODIUM: 144 mmol/L (ref 134–144)

## 2017-06-11 ENCOUNTER — Ambulatory Visit (INDEPENDENT_AMBULATORY_CARE_PROVIDER_SITE_OTHER)
Admission: RE | Admit: 2017-06-11 | Discharge: 2017-06-11 | Disposition: A | Discharge status: Home / Self Care | Payer: Medicare Other | Source: Ambulatory Visit | Attending: Cardiology | Admitting: Cardiology

## 2017-06-11 DIAGNOSIS — I350 Nonrheumatic aortic (valve) stenosis: Secondary | ICD-10-CM

## 2017-07-14 DIAGNOSIS — I10 Essential (primary) hypertension: Secondary | ICD-10-CM | POA: Diagnosis not present

## 2017-07-14 DIAGNOSIS — Z79899 Other long term (current) drug therapy: Secondary | ICD-10-CM | POA: Diagnosis not present

## 2017-07-14 DIAGNOSIS — D510 Vitamin B12 deficiency anemia due to intrinsic factor deficiency: Secondary | ICD-10-CM | POA: Diagnosis not present

## 2017-07-14 DIAGNOSIS — E039 Hypothyroidism, unspecified: Secondary | ICD-10-CM | POA: Diagnosis not present

## 2017-07-14 DIAGNOSIS — E785 Hyperlipidemia, unspecified: Secondary | ICD-10-CM | POA: Diagnosis not present

## 2017-07-14 DIAGNOSIS — N289 Disorder of kidney and ureter, unspecified: Secondary | ICD-10-CM | POA: Diagnosis not present

## 2017-07-14 DIAGNOSIS — E559 Vitamin D deficiency, unspecified: Secondary | ICD-10-CM | POA: Diagnosis not present

## 2017-07-14 DIAGNOSIS — Z6832 Body mass index (BMI) 32.0-32.9, adult: Secondary | ICD-10-CM | POA: Diagnosis not present

## 2017-08-08 ENCOUNTER — Telehealth: Payer: Self-pay

## 2017-08-08 NOTE — Telephone Encounter (Signed)
-----   Message from Mattie Marlin, RN sent at 08/08/2017  4:15 PM EDT ----- Please inform of normal results. Thank you

## 2017-08-08 NOTE — Telephone Encounter (Signed)
Called patient and left detailed voice message on phone regarding test results. 

## 2017-11-13 DIAGNOSIS — M461 Sacroiliitis, not elsewhere classified: Secondary | ICD-10-CM | POA: Insufficient documentation

## 2017-11-13 DIAGNOSIS — M47816 Spondylosis without myelopathy or radiculopathy, lumbar region: Secondary | ICD-10-CM | POA: Diagnosis not present

## 2017-11-13 DIAGNOSIS — M7061 Trochanteric bursitis, right hip: Secondary | ICD-10-CM

## 2017-11-13 HISTORY — DX: Trochanteric bursitis, right hip: M70.61

## 2017-11-13 HISTORY — DX: Sacroiliitis, not elsewhere classified: M46.1

## 2017-12-05 ENCOUNTER — Ambulatory Visit (INDEPENDENT_AMBULATORY_CARE_PROVIDER_SITE_OTHER): Payer: Medicare Other | Admitting: Cardiology

## 2017-12-05 ENCOUNTER — Encounter: Payer: Self-pay | Admitting: Cardiology

## 2017-12-05 VITALS — BP 128/62 | HR 67 | Ht 61.0 in | Wt 156.4 lb

## 2017-12-05 DIAGNOSIS — I10 Essential (primary) hypertension: Secondary | ICD-10-CM | POA: Diagnosis not present

## 2017-12-05 DIAGNOSIS — J410 Simple chronic bronchitis: Secondary | ICD-10-CM | POA: Diagnosis not present

## 2017-12-05 DIAGNOSIS — Z9889 Other specified postprocedural states: Secondary | ICD-10-CM

## 2017-12-05 DIAGNOSIS — I350 Nonrheumatic aortic (valve) stenosis: Secondary | ICD-10-CM

## 2017-12-05 DIAGNOSIS — E782 Mixed hyperlipidemia: Secondary | ICD-10-CM | POA: Diagnosis not present

## 2017-12-05 MED ORDER — VARENICLINE TARTRATE 0.5 MG X 11 & 1 MG X 42 PO MISC: ORAL | 0 refills | Status: DC

## 2017-12-05 NOTE — Patient Instructions (Signed)
Medication Instructions:  Your physician recommends that you continue on your current medications as directed. Please refer to the Current Medication list given to you today.  If you need a refill on your cardiac medications before your next appointment, please call your pharmacy.   Lab work: None  If you have labs (blood work) drawn today and your tests are completely normal, you will receive your results only by: Marland Kitchen MyChart Message (if you have MyChart) OR . A paper copy in the mail If you have any lab test that is abnormal or we need to change your treatment, we will call you to review the results.  Testing/Procedures: Your physician has requested that you have an echocardiogram. Echocardiography is a painless test that uses sound waves to create images of your heart. It provides your doctor with information about the size and shape of your heart and how well your heart's chambers and valves are working. This procedure takes approximately one hour. There are no restrictions for this procedure.  Your physician has requested that you have a carotid duplex. This test is an ultrasound of the carotid arteries in your neck. It looks at blood flow through these arteries that supply the brain with blood. Allow one hour for this exam. There are no restrictions or special instructions.  Follow-Up: At [facility], you and your health needs are our priority.  As part of our continuing mission to provide you with exceptional heart care, we have created designated Provider Care Teams.  These Care Teams include your primary Cardiologist (physician) and Advanced Practice Providers (APPs -  Physician Assistants and Nurse Practitioners) who all work together to provide you with the care you need, when you need it.  You will need a follow up appointment in 5 months.  Please call our office 2 months in advance to schedule this appointment.  You may see another member of our [employer] Provider Team in  [place]: Shirlee More, MD . Jyl Heinz, MD  Any Other Special Instructions Will Be Listed Below (If Applicable).

## 2017-12-05 NOTE — Progress Notes (Signed)
Cardiology Office Note:    Date:  12/05/2017   ID:  Annette Huang, DOB 1928/01/28, MRN [id number]  PCP:  Angelina Sheriff, MD  Cardiologist:  Jenne Campus, MD    Referring MD: Angelina Sheriff, MD   Chief Complaint  Patient presents with  . Follow-up  Doing well  History of Present Illness:    Annette Huang is a 84 y.o. female with aortic stenosis which was assessed as moderate.  She did have a calcium score of the aortic valve which was 425.  We will continue conservative approach overall she is doing well denies have any chest pain tightness squeezing pressure burning chest.  She still got exertional shortness of breath which undoubtedly is related to her smoking.  Past Medical History:  Diagnosis Date  . COPD (chronic obstructive pulmonary disease) ([place])   . Coronary artery disease   . CVA (cerebral vascular accident) ([place])   . Hyperlipidemia   . Hypertension   . Thoracic aortic atherosclerosis [facility])     Past Surgical History:  Procedure Laterality Date  . NECK SURGERY      Current Medications: Current Meds  Medication Sig  . Albuterol Sulfate (VENTOLIN HFA IN) Inhale 2 puffs into the lungs every 6 (six) hours as needed.  Marland Kitchen amLODipine (NORVASC) 5 MG tablet Take 5 mg daily by mouth.  Marland Kitchen atorvastatin (LIPITOR) 20 MG tablet Take 20 mg daily by mouth.  . clopidogrel (PLAVIX) 75 MG tablet Take 75 mg daily by mouth.  . enalapril (VASOTEC) 20 MG tablet Take 20 mg daily by mouth.  . furosemide (LASIX) 40 MG tablet Take 40 mg daily by mouth.  . levothyroxine (SYNTHROID, LEVOTHROID) 50 MCG tablet Take 1 tablet daily by mouth.     Allergies:   Codeine   Social History   Socioeconomic History  . Marital status: Married    Spouse name: Not on file  . Number of children: Not on file  . Years of education: Not on file  . Highest education level: Not on file  Occupational History  . Not on file  Social Needs  . Financial resource strain: Not on file  .  Food insecurity:    Worry: Not on file    Inability: Not on file  . Transportation needs:    Medical: Not on file    Non-medical: Not on file  Tobacco Use  . Smoking status: Former Smoker    Packs/day: 1.00    Years: 35.00    Pack years: 35.00  . Smokeless tobacco: Never Used  . Tobacco comment: 12/09/16  Substance and Sexual Activity  . Alcohol use: No    Frequency: Never  . Drug use: No  . Sexual activity: Not on file  Lifestyle  . Physical activity:    Days per week: Not on file    Minutes per session: Not on file  . Stress: Not on file  Relationships  . Social connections:    Talks on phone: Not on file    Gets together: Not on file    Attends religious service: Not on file    Active member of club or organization: Not on file    Attends meetings of clubs or organizations: Not on file    Relationship status: Not on file  Other Topics Concern  . Not on file  Social History Narrative  . Not on file     Family History: The patient's family history includes Heart disease in her  father and mother; Hypertension in her mother. ROS:   Please see the history of present illness.    All 14 point review of systems negative except as described per history of present illness  EKGs/Labs/Other Studies Reviewed:      Recent Labs: 01/20/2017: NT-Pro BNP 393 05/26/2017: BUN 14; Creatinine, Ser 1.11; Potassium 4.0; Sodium 144  Recent Lipid Panel No results found for: CHOL, TRIG, HDL, CHOLHDL, VLDL, LDLCALC, LDLDIRECT  Physical Exam:    VS:  BP 128/62   Pulse 67   Ht 5\' 1"  (1.549 m)   Wt 156 lb 6.4 oz (70.9 kg)   SpO2 93%   BMI 29.55 kg/m     Wt Readings from Last 3 Encounters:  12/05/17 156 lb 6.4 oz (70.9 kg)  05/26/17 164 lb (74.4 kg)  02/24/17 167 lb (75.8 kg)     GEN:  Well nourished, well developed in no acute distress HEENT: Normal NECK: No JVD; No carotid bruits LYMPHATICS: No lymphadenopathy CARDIAC: RRR, systolic ejection murmur grade 2/6 best heard at  the right upper portion of the sternum, no rubs, no gallops RESPIRATORY:  Clear to auscultation without rales, wheezing or rhonchi  ABDOMEN: Soft, non-tender, non-distended MUSCULOSKELETAL:  No edema; No deformity  SKIN: Warm and dry LOWER EXTREMITIES: no swelling NEUROLOGIC:  Alert and oriented x 3 PSYCHIATRIC:  Normal affect   ASSESSMENT:    1. Nonrheumatic aortic valve stenosis   2. Essential hypertension   3. Simple chronic bronchitis ([place])   4. Mixed hyperlipidemia    PLAN:    In order of problems listed above:  1. Aortic stenosis which appears to be moderate stent to repeat her echocardiogram which I will do.  She reports to have may be a little bit more shortness of breath will clarify this by doing echocardiogram.  Of course she was told to quit smoking she requested a prescription for Chantix which we will do. 2. Essential hypertension blood pressure well controlled continue present management. 3. COPD will continue with present management.  Obviously she need to quit smoking 4. Mixed dyslipidemia will contact primary care physician to get a copy of her fasting lipid profile. 5. Peripheral vascular disease status post carotid endarterectomy will ask you to have carotid ultrasound.   Medication Adjustments/Labs and Tests Ordered: Current medicines are reviewed at length with the patient today.  Concerns regarding medicines are outlined above.  No orders of the defined types were placed in this encounter.  Medication changes: No orders of the defined types were placed in this encounter.   Signed, Park Liter, MD, [facility] 12/05/2017 10:39 AM    [place]

## 2017-12-08 DIAGNOSIS — Z23 Encounter for immunization: Secondary | ICD-10-CM | POA: Diagnosis not present

## 2018-01-20 ENCOUNTER — Ambulatory Visit (INDEPENDENT_AMBULATORY_CARE_PROVIDER_SITE_OTHER): Payer: Medicare Other | Admitting: Podiatry

## 2018-01-20 ENCOUNTER — Encounter: Payer: Self-pay | Admitting: Podiatry

## 2018-01-20 ENCOUNTER — Ambulatory Visit (INDEPENDENT_AMBULATORY_CARE_PROVIDER_SITE_OTHER): Payer: Medicare Other

## 2018-01-20 VITALS — BP 137/66 | HR 67 | Resp 16 | Ht 60.0 in | Wt 150.0 lb

## 2018-01-20 DIAGNOSIS — M79674 Pain in right toe(s): Secondary | ICD-10-CM | POA: Diagnosis not present

## 2018-01-20 DIAGNOSIS — M2041 Other hammer toe(s) (acquired), right foot: Secondary | ICD-10-CM

## 2018-01-20 DIAGNOSIS — M205X1 Other deformities of toe(s) (acquired), right foot: Secondary | ICD-10-CM

## 2018-01-20 DIAGNOSIS — Z8673 Personal history of transient ischemic attack (TIA), and cerebral infarction without residual deficits: Secondary | ICD-10-CM

## 2018-01-20 DIAGNOSIS — M6240 Contracture of muscle, unspecified site: Secondary | ICD-10-CM | POA: Diagnosis not present

## 2018-01-20 DIAGNOSIS — M205X9 Other deformities of toe(s) (acquired), unspecified foot: Secondary | ICD-10-CM

## 2018-01-20 DIAGNOSIS — I350 Nonrheumatic aortic (valve) stenosis: Secondary | ICD-10-CM | POA: Diagnosis not present

## 2018-01-20 NOTE — Progress Notes (Signed)
Carotid duplex exam has been performed. History of bilateral endarterectomy.  Mild bilateral stenosis and calcific plaque was observed.

## 2018-01-20 NOTE — Progress Notes (Signed)
Complete echocardiogram has been performed.  Jimmy Quinlin Conant RDCS, RVT 

## 2018-01-20 NOTE — Progress Notes (Signed)
  Subjective:  Patient ID: Annette Huang, female    DOB: Jun 19, 1927,  MRN: [id number]  Chief Complaint  Patient presents with  . Foot Pain    R 4th and 5th toes pain x 5 years; 10/10 sharp pain -pt states she had injured her 4th toe years ago but never got it treated Tx: cotton, salve and OTC pain rub    84 y.o. female presents with the above complaint.  History as above has tried putting spacer between her toe without relief.  Review of Systems: Negative except as noted in the HPI. Denies N/V/F/Ch.  Past Medical History:  Diagnosis Date  . COPD (chronic obstructive pulmonary disease) ([place])   . Coronary artery disease   . CVA (cerebral vascular accident) ([place])   . Hyperlipidemia   . Hypertension   . Thoracic aortic atherosclerosis ([facility])     Current Outpatient Medications:  .  amLODipine (NORVASC) 5 MG tablet, Take 5 mg daily by mouth., Disp: , Rfl:  .  atorvastatin (LIPITOR) 20 MG tablet, Take 20 mg daily by mouth., Disp: , Rfl:  .  clopidogrel (PLAVIX) 75 MG tablet, Take 75 mg daily by mouth., Disp: , Rfl:  .  enalapril (VASOTEC) 20 MG tablet, Take 20 mg daily by mouth., Disp: , Rfl:  .  furosemide (LASIX) 40 MG tablet, Take 40 mg daily by mouth., Disp: , Rfl:  .  levothyroxine (SYNTHROID, LEVOTHROID) 50 MCG tablet, Take 1 tablet daily by mouth., Disp: , Rfl:   Social History   Tobacco Use  Smoking Status Former Smoker  . Packs/day: 1.00  . Years: 35.00  . Pack years: 35.00  Smokeless Tobacco Never Used  Tobacco Comment   12/09/16    Allergies  Allergen Reactions  . Codeine Hives and Nausea Only   Objective:   Vitals:   01/20/18 1516  BP: 137/66  Pulse: 67  Resp: 16   Body mass index is 29.29 kg/m. Constitutional Well developed. Well nourished.  Vascular Dorsalis pedis pulses palpable bilaterally. Posterior tibial pulses palpable bilaterally. Capillary refill normal to all digits.  No cyanosis or clubbing noted. Pedal hair growth normal.  Neurologic  Normal speech. Oriented to person, place, and time. Epicritic sensation to light touch grossly present bilaterally.  Dermatologic Nails well groomed and normal in appearance. No open wounds. Heloma molle right fourth interspace  Orthopedic:  Hammertoe deformities 234 right foot pain to palpation about the fourth and fifth toes at the PIPJ fourth DIPJ fifth   Radiographs: Taken reviewed evidence of prior fracture of the phalanges no acute fractures hammertoe deformities noted Assessment:   1. Hammertoe of right foot   2. Contracture of tendon sheath   3. Contracture of toe joint   4. Acquired adductovarus rotation of toe of right foot   5. Pain in toe of right foot    Plan:  Patient was evaluated and treated and all questions answered.  Hammertoes right 4th/5th toes -X-rays reviewed as above -Discussed conservative or surgical care with patient -Patient wishes to trial conservative therapy at the time.  Silicone toe cap dispensed educated on use -Discussed possible surgical correction if conservative therapy fails would consider in office hammertoe correction of the right fourth and fifth toes.  Return in about 6 weeks (around 03/03/2018) for Hammertoes right.

## 2018-01-20 NOTE — Progress Notes (Signed)
   Subjective:    Patient ID: Annette Huang, female    DOB: 01-30-1928, 84 y.o.   MRN: [id number]  HPI    Review of Systems  Musculoskeletal: Positive for arthralgias, joint swelling and myalgias.  All other systems reviewed and are negative.      Objective:   Physical Exam        Assessment & Plan:

## 2018-01-22 ENCOUNTER — Ambulatory Visit: Payer: Medicare Other | Admitting: Podiatry

## 2018-01-23 ENCOUNTER — Telehealth: Payer: Self-pay | Admitting: Emergency Medicine

## 2018-01-23 DIAGNOSIS — R931 Abnormal findings on diagnostic imaging of heart and coronary circulation: Secondary | ICD-10-CM

## 2018-01-23 NOTE — Telephone Encounter (Signed)
Informed patient of results of echocardiogram and carotis ultrasound. Informed patient she will need to have a amyloid scan, she verbally understands will put order in and send message to scheduler.

## 2018-01-26 ENCOUNTER — Other Ambulatory Visit: Payer: Self-pay | Admitting: Podiatry

## 2018-01-26 DIAGNOSIS — M205X9 Other deformities of toe(s) (acquired), unspecified foot: Secondary | ICD-10-CM

## 2018-01-26 DIAGNOSIS — M6240 Contracture of muscle, unspecified site: Secondary | ICD-10-CM

## 2018-01-26 DIAGNOSIS — M2041 Other hammer toe(s) (acquired), right foot: Secondary | ICD-10-CM

## 2018-02-03 DIAGNOSIS — H524 Presbyopia: Secondary | ICD-10-CM | POA: Diagnosis not present

## 2018-02-03 DIAGNOSIS — Z961 Presence of intraocular lens: Secondary | ICD-10-CM | POA: Diagnosis not present

## 2018-02-03 DIAGNOSIS — H52223 Regular astigmatism, bilateral: Secondary | ICD-10-CM | POA: Diagnosis not present

## 2018-02-03 DIAGNOSIS — H35341 Macular cyst, hole, or pseudohole, right eye: Secondary | ICD-10-CM | POA: Diagnosis not present

## 2018-02-03 DIAGNOSIS — Z9849 Cataract extraction status, unspecified eye: Secondary | ICD-10-CM | POA: Diagnosis not present

## 2018-02-03 DIAGNOSIS — H35373 Puckering of macula, bilateral: Secondary | ICD-10-CM | POA: Diagnosis not present

## 2018-02-03 DIAGNOSIS — H353131 Nonexudative age-related macular degeneration, bilateral, early dry stage: Secondary | ICD-10-CM | POA: Diagnosis not present

## 2018-02-03 DIAGNOSIS — I10 Essential (primary) hypertension: Secondary | ICD-10-CM | POA: Diagnosis not present

## 2018-02-03 DIAGNOSIS — H5203 Hypermetropia, bilateral: Secondary | ICD-10-CM | POA: Diagnosis not present

## 2018-02-23 ENCOUNTER — Ambulatory Visit (HOSPITAL_COMMUNITY): Payer: Medicare Other | Attending: Cardiology

## 2018-02-23 ENCOUNTER — Encounter (INDEPENDENT_AMBULATORY_CARE_PROVIDER_SITE_OTHER): Payer: Self-pay

## 2018-02-23 DIAGNOSIS — R931 Abnormal findings on diagnostic imaging of heart and coronary circulation: Secondary | ICD-10-CM | POA: Diagnosis not present

## 2018-02-23 MED ORDER — TECHNETIUM TC 99M PYROPHOSPHATE
21.6000 | Freq: Once | INTRAVENOUS | Status: AC
  Administered 2018-02-23: 21.6 via INTRAVENOUS

## 2018-03-03 ENCOUNTER — Ambulatory Visit: Payer: Medicare Other | Admitting: Podiatry

## 2018-03-20 DIAGNOSIS — Z0000 Encounter for general adult medical examination without abnormal findings: Secondary | ICD-10-CM | POA: Diagnosis not present

## 2018-03-20 DIAGNOSIS — E785 Hyperlipidemia, unspecified: Secondary | ICD-10-CM | POA: Diagnosis not present

## 2018-03-20 DIAGNOSIS — Z6832 Body mass index (BMI) 32.0-32.9, adult: Secondary | ICD-10-CM | POA: Diagnosis not present

## 2018-03-20 DIAGNOSIS — E039 Hypothyroidism, unspecified: Secondary | ICD-10-CM | POA: Diagnosis not present

## 2018-03-20 DIAGNOSIS — E7800 Pure hypercholesterolemia, unspecified: Secondary | ICD-10-CM | POA: Diagnosis not present

## 2018-03-20 DIAGNOSIS — I10 Essential (primary) hypertension: Secondary | ICD-10-CM | POA: Diagnosis not present

## 2018-03-20 DIAGNOSIS — D510 Vitamin B12 deficiency anemia due to intrinsic factor deficiency: Secondary | ICD-10-CM | POA: Diagnosis not present

## 2018-03-20 DIAGNOSIS — Z79899 Other long term (current) drug therapy: Secondary | ICD-10-CM | POA: Diagnosis not present

## 2018-03-20 DIAGNOSIS — E559 Vitamin D deficiency, unspecified: Secondary | ICD-10-CM | POA: Diagnosis not present

## 2018-04-07 DIAGNOSIS — Z1231 Encounter for screening mammogram for malignant neoplasm of breast: Secondary | ICD-10-CM | POA: Diagnosis not present

## 2018-04-10 ENCOUNTER — Encounter: Payer: Self-pay | Admitting: Cardiology

## 2018-04-10 ENCOUNTER — Ambulatory Visit (INDEPENDENT_AMBULATORY_CARE_PROVIDER_SITE_OTHER): Payer: Medicare Other | Admitting: Cardiology

## 2018-04-10 VITALS — BP 124/60 | HR 64 | Ht 60.0 in | Wt 158.2 lb

## 2018-04-10 DIAGNOSIS — I350 Nonrheumatic aortic (valve) stenosis: Secondary | ICD-10-CM

## 2018-04-10 DIAGNOSIS — I10 Essential (primary) hypertension: Secondary | ICD-10-CM

## 2018-04-10 DIAGNOSIS — F17200 Nicotine dependence, unspecified, uncomplicated: Secondary | ICD-10-CM | POA: Diagnosis not present

## 2018-04-10 DIAGNOSIS — I6990 Unspecified sequelae of unspecified cerebrovascular disease: Secondary | ICD-10-CM

## 2018-04-10 MED ORDER — ATORVASTATIN CALCIUM 40 MG PO TABS: ORAL_TABLET | ORAL | 2 refills | Status: DC

## 2018-04-10 NOTE — Patient Instructions (Addendum)
Medication Instructions:  Your physician has recommended you make the following change in your medication: INCREASE lipitor from 20 mg daily to 40 mg daily  If you need a refill on your cardiac medications before your next appointment, please call your pharmacy.   Lab work: NONE If you have labs (blood work) drawn today and your tests are completely normal, you will receive your results only by: Marland Kitchen MyChart Message (if you have MyChart) OR . A paper copy in the mail If you have any lab test that is abnormal or we need to change your treatment, we will call you to review the results.  Testing/Procedures: Your physician has requested that you have an echocardiogram. Echocardiography is a painless test that uses sound waves to create images of your heart. It provides your doctor with information about the size and shape of your heart and how well your heart's chambers and valves are working. This procedure takes approximately one hour. There are no restrictions for this procedure.    Follow-Up: At [facility], you and your health needs are our priority.  As part of our continuing mission to provide you with exceptional heart care, we have created designated Provider Care Teams.  These Care Teams include your primary Cardiologist (physician) and Advanced Practice Providers (APPs -  Physician Assistants and Nurse Practitioners) who all work together to provide you with the care you need, when you need it. You will need a follow up appointment in 5 months.     Any Other Special Instructions Will Be Listed Below   Echocardiogram An echocardiogram is a procedure that uses painless sound waves (ultrasound) to produce an image of the heart. Images from an echocardiogram can provide important information about:  Signs of coronary artery disease (CAD).  Aneurysm detection. An aneurysm is a weak or damaged part of an artery wall that bulges out from the normal force of blood pumping through the  body.  Heart size and shape. Changes in the size or shape of the heart can be associated with certain conditions, including heart failure, aneurysm, and CAD.  Heart muscle function.  Heart valve function.  Signs of a past heart attack.  Fluid buildup around the heart.  Thickening of the heart muscle.  A tumor or infectious growth around the heart valves. Tell a health care provider about:  Any allergies you have.  All medicines you are taking, including vitamins, herbs, eye drops, creams, and over-the-counter medicines.  Any blood disorders you have.  Any surgeries you have had.  Any medical conditions you have.  Whether you are pregnant or may be pregnant. What are the risks? Generally, this is a safe procedure. However, problems may occur, including:  Allergic reaction to dye (contrast) that may be used during the procedure. What happens before the procedure? No specific preparation is needed. You may eat and drink normally. What happens during the procedure?   An IV tube may be inserted into one of your veins.  You may receive contrast through this tube. A contrast is an injection that improves the quality of the pictures from your heart.  A gel will be applied to your chest.  A wand-like tool (transducer) will be moved over your chest. The gel will help to transmit the sound waves from the transducer.  The sound waves will harmlessly bounce off of your heart to allow the heart images to be captured in real-time motion. The images will be recorded on a computer. The procedure may vary among  health care providers and hospitals. What happens after the procedure?  You may return to your normal, everyday life, including diet, activities, and medicines, unless your health care provider tells you not to do that. Summary  An echocardiogram is a procedure that uses painless sound waves (ultrasound) to produce an image of the heart.  Images from an echocardiogram can  provide important information about the size and shape of your heart, heart muscle function, heart valve function, and fluid buildup around your heart.  You do not need to do anything to prepare before this procedure. You may eat and drink normally.  After the echocardiogram is completed, you may return to your normal, everyday life, unless your health care provider tells you not to do that. This information is not intended to replace advice given to you by your health care provider. Make sure you discuss any questions you have with your health care provider. Document Released: 01/19/2000 Document Revised: 02/24/2016 Document Reviewed: 02/24/2016 Elsevier Interactive Patient Education  2019 [employer].

## 2018-04-10 NOTE — Progress Notes (Signed)
Cardiology Office Note:    Date:  04/10/2018   ID:  Annette Huang, DOB 03-02-1927, MRN [id number]  PCP:  Angelina Sheriff, MD  Cardiologist:  Jenne Campus, MD    Referring MD: Angelina Sheriff, MD   Chief Complaint  Patient presents with  . Follow-up  Doing well  History of Present Illness:    Annette Huang is a 85 y.o. female with aortic stenosis last time assessed in December which was moderate.  Sadly she still continue to smoke she also has history of peripheral vascular disease however latest carotic ultrasound did not show any critical stenosis.  Overall seems to be doing well small but much less than before no dizziness no passing out no unusual shortness of breath no chest pain.  Past Medical History:  Diagnosis Date  . COPD (chronic obstructive pulmonary disease) ([place])   . Coronary artery disease   . CVA (cerebral vascular accident) ([place])   . Hyperlipidemia   . Hypertension   . Thoracic aortic atherosclerosis [facility])     Past Surgical History:  Procedure Laterality Date  . NECK SURGERY      Current Medications: Current Meds  Medication Sig  . amLODipine (NORVASC) 5 MG tablet Take 5 mg daily by mouth.  Marland Kitchen atorvastatin (LIPITOR) 20 MG tablet Take 20 mg daily by mouth.  . clopidogrel (PLAVIX) 75 MG tablet Take 75 mg daily by mouth.  . enalapril (VASOTEC) 20 MG tablet Take 20 mg daily by mouth.  . furosemide (LASIX) 40 MG tablet Take 40 mg daily by mouth.  . levothyroxine (SYNTHROID, LEVOTHROID) 50 MCG tablet Take 1 tablet daily by mouth.     Allergies:   Codeine   Social History   Socioeconomic History  . Marital status: Married    Spouse name: Not on file  . Number of children: Not on file  . Years of education: Not on file  . Highest education level: Not on file  Occupational History  . Not on file  Social Needs  . Financial resource strain: Not on file  . Food insecurity:    Worry: Not on file    Inability: Not on file  .  Transportation needs:    Medical: Not on file    Non-medical: Not on file  Tobacco Use  . Smoking status: Former Smoker    Packs/day: 1.00    Years: 35.00    Pack years: 35.00  . Smokeless tobacco: Never Used  . Tobacco comment: 12/09/16  Substance and Sexual Activity  . Alcohol use: No    Frequency: Never  . Drug use: No  . Sexual activity: Not on file  Lifestyle  . Physical activity:    Days per week: Not on file    Minutes per session: Not on file  . Stress: Not on file  Relationships  . Social connections:    Talks on phone: Not on file    Gets together: Not on file    Attends religious service: Not on file    Active member of club or organization: Not on file    Attends meetings of clubs or organizations: Not on file    Relationship status: Not on file  Other Topics Concern  . Not on file  Social History Narrative  . Not on file     Family History: The patient's family history includes Heart disease in her father and mother; Hypertension in her mother. ROS:   Please see the history of  present illness.    All 14 point review of systems negative except as described per history of present illness  EKGs/Labs/Other Studies Reviewed:      Recent Labs: 05/26/2017: BUN 14; Creatinine, Ser 1.11; Potassium 4.0; Sodium 144  Recent Lipid Panel No results found for: CHOL, TRIG, HDL, CHOLHDL, VLDL, LDLCALC, LDLDIRECT  Physical Exam:    VS:  BP 124/60   Pulse 64   Ht 5' (1.524 m)   Wt 158 lb 3.2 oz (71.8 kg)   SpO2 95%   BMI 30.90 kg/m     Wt Readings from Last 3 Encounters:  04/10/18 158 lb 3.2 oz (71.8 kg)  01/20/18 150 lb (68 kg)  12/05/17 156 lb 6.4 oz (70.9 kg)     GEN:  Well nourished, well developed in no acute distress HEENT: Normal NECK: No JVD; No carotid bruits LYMPHATICS: No lymphadenopathy CARDIAC: RRR, systolic ejection murmur grade 2/6 to 3/6 best heard at right upper portion of the sternum, no rubs, no gallops RESPIRATORY:  Clear to  auscultation without rales, wheezing or rhonchi  ABDOMEN: Soft, non-tender, non-distended MUSCULOSKELETAL:  No edema; No deformity  SKIN: Warm and dry LOWER EXTREMITIES: no swelling NEUROLOGIC:  Alert and oriented x 3 PSYCHIATRIC:  Normal affect   ASSESSMENT:    1. Essential hypertension   2. Nonrheumatic aortic valve stenosis   3. Smoking   4. Late effects of CVA (cerebrovascular accident)    PLAN:    In order of problems listed above:  1. Essential hypertension doing well from that point review blood pressure well controlled we will continue present management. 2. Nonrheumatic aortic valve stenosis.  Well check echocardiogram in June 3. Smoking sadly still continue insurance company did not pay for Chantix so she did not use it she cut down significantly amount of cigarettes she smokes but she still continue. 4. Late effect of CVA stable 5. Dyslipidemia I asked her to double the dose of Lipitor and then will recheck fasting lipid profile   Medication Adjustments/Labs and Tests Ordered: Current medicines are reviewed at length with the patient today.  Concerns regarding medicines are outlined above.  No orders of the defined types were placed in this encounter.  Medication changes: No orders of the defined types were placed in this encounter.   Signed, Park Liter, MD, [facility] 04/10/2018 11:23 AM    [place]

## 2018-05-07 DIAGNOSIS — J441 Chronic obstructive pulmonary disease with (acute) exacerbation: Secondary | ICD-10-CM | POA: Diagnosis not present

## 2018-05-07 DIAGNOSIS — R6883 Chills (without fever): Secondary | ICD-10-CM | POA: Diagnosis not present

## 2018-05-07 DIAGNOSIS — Z6832 Body mass index (BMI) 32.0-32.9, adult: Secondary | ICD-10-CM | POA: Diagnosis not present

## 2018-05-07 DIAGNOSIS — R600 Localized edema: Secondary | ICD-10-CM | POA: Diagnosis not present

## 2018-05-20 DIAGNOSIS — N390 Urinary tract infection, site not specified: Secondary | ICD-10-CM | POA: Diagnosis not present

## 2018-05-20 DIAGNOSIS — L259 Unspecified contact dermatitis, unspecified cause: Secondary | ICD-10-CM | POA: Diagnosis not present

## 2018-05-20 DIAGNOSIS — Z6831 Body mass index (BMI) 31.0-31.9, adult: Secondary | ICD-10-CM | POA: Diagnosis not present

## 2018-06-17 DIAGNOSIS — F17200 Nicotine dependence, unspecified, uncomplicated: Secondary | ICD-10-CM | POA: Diagnosis not present

## 2018-06-17 DIAGNOSIS — I10 Essential (primary) hypertension: Secondary | ICD-10-CM | POA: Diagnosis not present

## 2018-06-17 DIAGNOSIS — Z6831 Body mass index (BMI) 31.0-31.9, adult: Secondary | ICD-10-CM | POA: Diagnosis not present

## 2018-06-17 DIAGNOSIS — J449 Chronic obstructive pulmonary disease, unspecified: Secondary | ICD-10-CM | POA: Diagnosis not present

## 2018-07-08 ENCOUNTER — Other Ambulatory Visit: Payer: Medicare Other

## 2018-07-29 ENCOUNTER — Telehealth: Payer: Self-pay | Admitting: Cardiology

## 2018-07-29 DIAGNOSIS — R062 Wheezing: Secondary | ICD-10-CM | POA: Diagnosis not present

## 2018-07-29 DIAGNOSIS — R600 Localized edema: Secondary | ICD-10-CM | POA: Diagnosis not present

## 2018-07-29 DIAGNOSIS — I350 Nonrheumatic aortic (valve) stenosis: Secondary | ICD-10-CM | POA: Diagnosis not present

## 2018-07-29 DIAGNOSIS — R0602 Shortness of breath: Secondary | ICD-10-CM | POA: Diagnosis not present

## 2018-07-29 DIAGNOSIS — J441 Chronic obstructive pulmonary disease with (acute) exacerbation: Secondary | ICD-10-CM | POA: Diagnosis not present

## 2018-07-29 DIAGNOSIS — N179 Acute kidney failure, unspecified: Secondary | ICD-10-CM | POA: Diagnosis not present

## 2018-07-29 DIAGNOSIS — J9601 Acute respiratory failure with hypoxia: Secondary | ICD-10-CM | POA: Diagnosis not present

## 2018-07-29 DIAGNOSIS — Z8709 Personal history of other diseases of the respiratory system: Secondary | ICD-10-CM | POA: Diagnosis not present

## 2018-07-29 DIAGNOSIS — J9811 Atelectasis: Secondary | ICD-10-CM | POA: Diagnosis not present

## 2018-07-29 DIAGNOSIS — M7989 Other specified soft tissue disorders: Secondary | ICD-10-CM | POA: Diagnosis not present

## 2018-07-29 DIAGNOSIS — I110 Hypertensive heart disease with heart failure: Secondary | ICD-10-CM | POA: Diagnosis not present

## 2018-07-29 NOTE — Telephone Encounter (Signed)
Called patient's daughter. She reports the patient has been short of breath for the past two days, she reports leg swelling and her oxygen sats have been 85% and 87% on 3 L of oxygen Dr. Agustin Cree aware and he advised she go to the emergency department. Patient's daughter verbally understands, no further questions.

## 2018-07-29 NOTE — Telephone Encounter (Signed)
Patients daughter is calling saying patient is short of breath and both legs and feet swelling. I put in for OV on Friday but someone needs to contact her right away.

## 2018-07-30 DIAGNOSIS — R0602 Shortness of breath: Secondary | ICD-10-CM | POA: Diagnosis not present

## 2018-07-30 DIAGNOSIS — J441 Chronic obstructive pulmonary disease with (acute) exacerbation: Secondary | ICD-10-CM | POA: Diagnosis not present

## 2018-07-30 DIAGNOSIS — I110 Hypertensive heart disease with heart failure: Secondary | ICD-10-CM | POA: Diagnosis not present

## 2018-07-30 DIAGNOSIS — R0609 Other forms of dyspnea: Secondary | ICD-10-CM | POA: Diagnosis not present

## 2018-07-30 DIAGNOSIS — J9601 Acute respiratory failure with hypoxia: Secondary | ICD-10-CM | POA: Diagnosis not present

## 2018-07-30 DIAGNOSIS — N179 Acute kidney failure, unspecified: Secondary | ICD-10-CM | POA: Diagnosis not present

## 2018-07-31 ENCOUNTER — Other Ambulatory Visit: Payer: Self-pay

## 2018-07-31 ENCOUNTER — Ambulatory Visit (INDEPENDENT_AMBULATORY_CARE_PROVIDER_SITE_OTHER): Payer: Medicare Other | Admitting: Cardiology

## 2018-07-31 ENCOUNTER — Encounter: Payer: Self-pay | Admitting: Cardiology

## 2018-07-31 VITALS — BP 140/78 | HR 86 | Ht 60.0 in | Wt 156.0 lb

## 2018-07-31 DIAGNOSIS — R0609 Other forms of dyspnea: Secondary | ICD-10-CM

## 2018-07-31 DIAGNOSIS — F17210 Nicotine dependence, cigarettes, uncomplicated: Secondary | ICD-10-CM | POA: Diagnosis present

## 2018-07-31 DIAGNOSIS — I110 Hypertensive heart disease with heart failure: Secondary | ICD-10-CM | POA: Diagnosis present

## 2018-07-31 DIAGNOSIS — I350 Nonrheumatic aortic (valve) stenosis: Secondary | ICD-10-CM

## 2018-07-31 DIAGNOSIS — I5030 Unspecified diastolic (congestive) heart failure: Secondary | ICD-10-CM | POA: Insufficient documentation

## 2018-07-31 DIAGNOSIS — E785 Hyperlipidemia, unspecified: Secondary | ICD-10-CM | POA: Diagnosis present

## 2018-07-31 DIAGNOSIS — Z7901 Long term (current) use of anticoagulants: Secondary | ICD-10-CM | POA: Diagnosis not present

## 2018-07-31 DIAGNOSIS — I10 Essential (primary) hypertension: Secondary | ICD-10-CM | POA: Diagnosis not present

## 2018-07-31 DIAGNOSIS — J441 Chronic obstructive pulmonary disease with (acute) exacerbation: Secondary | ICD-10-CM | POA: Diagnosis not present

## 2018-07-31 DIAGNOSIS — F17200 Nicotine dependence, unspecified, uncomplicated: Secondary | ICD-10-CM | POA: Diagnosis not present

## 2018-07-31 DIAGNOSIS — I6990 Unspecified sequelae of unspecified cerebrovascular disease: Secondary | ICD-10-CM | POA: Diagnosis not present

## 2018-07-31 DIAGNOSIS — I5033 Acute on chronic diastolic (congestive) heart failure: Secondary | ICD-10-CM | POA: Diagnosis present

## 2018-07-31 DIAGNOSIS — K219 Gastro-esophageal reflux disease without esophagitis: Secondary | ICD-10-CM | POA: Diagnosis present

## 2018-07-31 DIAGNOSIS — I5032 Chronic diastolic (congestive) heart failure: Secondary | ICD-10-CM

## 2018-07-31 DIAGNOSIS — J9601 Acute respiratory failure with hypoxia: Secondary | ICD-10-CM | POA: Diagnosis present

## 2018-07-31 DIAGNOSIS — N179 Acute kidney failure, unspecified: Secondary | ICD-10-CM | POA: Diagnosis present

## 2018-07-31 DIAGNOSIS — M1990 Unspecified osteoarthritis, unspecified site: Secondary | ICD-10-CM | POA: Diagnosis present

## 2018-07-31 HISTORY — DX: Unspecified diastolic (congestive) heart failure: I50.30

## 2018-07-31 NOTE — Progress Notes (Signed)
Cardiology Office Note:    Date:  07/31/2018   ID:  Annette Huang, DOB 04-17-1927, MRN [id number]  PCP:  Angelina Sheriff, MD  Cardiologist:  Jenne Campus, MD    Referring MD: Angelina Sheriff, MD   Chief Complaint  Patient presents with  . Shortness of Breath  . Edema  House in the hospital I was discharged today  History of Present Illness:    Annette Huang is a 85 y.o. female with past medical history significant for aortic stenosis, COPD, she is a chronic smoker, hyperlipidemia, history of hypertension.  Today about 20 minutes before she came to my office she was discharged from the hospital.  She was there because of shortness of breath.  Diagnosis has been established as exacerbation of COPD and that was managed with steroids as well as antibiotic and diastolic congestive heart failure.  She was managed appropriately with diuretics.  His ACE inhibitor has been withdrawn because of kidney dysfunction.  Overall she is doing better shortness of is improved and actually she did not smoke since the time that she left the hospital.  She comes today with her daughter and they want to know exactly what happened in the hospital.  I spent a great deal of time reviewing her record from the hospital and explained to them all findings.  Past Medical History:  Diagnosis Date  . COPD (chronic obstructive pulmonary disease) ([place])   . Coronary artery disease   . CVA (cerebral vascular accident) ([place])   . Hyperlipidemia   . Hypertension   . Thoracic aortic atherosclerosis [facility])     Past Surgical History:  Procedure Laterality Date  . NECK SURGERY      Current Medications: Current Meds  Medication Sig  . amLODipine (NORVASC) 5 MG tablet Take 5 mg daily by mouth.  Marland Kitchen atorvastatin (LIPITOR) 40 MG tablet Please take 1 tablet, once a day  . clopidogrel (PLAVIX) 75 MG tablet Take 75 mg daily by mouth.  . furosemide (LASIX) 40 MG tablet Take 40 mg daily by mouth.  .  levothyroxine (SYNTHROID, LEVOTHROID) 50 MCG tablet Take 1 tablet daily by mouth.     Allergies:   Codeine   Social History   Socioeconomic History  . Marital status: Married    Spouse name: Not on file  . Number of children: Not on file  . Years of education: Not on file  . Highest education level: Not on file  Occupational History  . Not on file  Social Needs  . Financial resource strain: Not on file  . Food insecurity    Worry: Not on file    Inability: Not on file  . Transportation needs    Medical: Not on file    Non-medical: Not on file  Tobacco Use  . Smoking status: Former Smoker    Packs/day: 1.00    Years: 35.00    Pack years: 35.00  . Smokeless tobacco: Never Used  . Tobacco comment: 12/09/16  Substance and Sexual Activity  . Alcohol use: No    Frequency: Never  . Drug use: No  . Sexual activity: Not on file  Lifestyle  . Physical activity    Days per week: Not on file    Minutes per session: Not on file  . Stress: Not on file  Relationships  . Social Herbalist on phone: Not on file    Gets together: Not on file    Attends  religious service: Not on file    Active member of club or organization: Not on file    Attends meetings of clubs or organizations: Not on file    Relationship status: Not on file  Other Topics Concern  . Not on file  Social History Narrative  . Not on file     Family History: The patient's family history includes Heart disease in her father and mother; Hypertension in her mother. ROS:   Please see the history of present illness.    All 14 point review of systems negative except as described per history of present illness  EKGs/Labs/Other Studies Reviewed:    Echocardiogram done in the hospital showed normal left ventricular ejection fraction 6065%, impaired relaxation, mild dilatation of the left atrium, moderate aortic stenosis with mean gradient 20 6 aortic valve area 1 cm.  Stress test was done which showed  no evidence of ischemia  Recent Labs: No results found for requested labs within last 8760 hours.  Recent Lipid Panel No results found for: CHOL, TRIG, HDL, CHOLHDL, VLDL, LDLCALC, LDLDIRECT  Physical Exam:    VS:  BP 140/78   Pulse 86   Ht 5' (1.524 m)   Wt 156 lb (70.8 kg)   SpO2 97% Comment: 2 lt of O2  BMI 30.47 kg/m     Wt Readings from Last 3 Encounters:  07/31/18 156 lb (70.8 kg)  04/10/18 158 lb 3.2 oz (71.8 kg)  01/20/18 150 lb (68 kg)     GEN:  Well nourished, well developed in no acute distress HEENT: Normal NECK: No JVD; No carotid bruits LYMPHATICS: No lymphadenopathy CARDIAC: RRR, systolic ejection murmur grade 2/6 best heard at the right upper portion of the sternum s, no rubs, no gallops RESPIRATORY:  Clear to auscultation without rales, wheezing or rhonchi  ABDOMEN: Soft, non-tender, non-distended MUSCULOSKELETAL:  No edema; No deformity  SKIN: Warm and dry LOWER EXTREMITIES: no swelling NEUROLOGIC:  Alert and oriented x 3 PSYCHIATRIC:  Normal affect   ASSESSMENT:    1. Essential hypertension   2. Nonrheumatic aortic valve stenosis   3. Late effects of CVA (cerebrovascular accident)   4. Smoking   5. Dyspnea on exertion   6. Chronic diastolic congestive heart failure ([place])    PLAN:    In order of problems listed above:  1. Essential hypertension blood pressure appears to be well controlled continue present management. 2. Nonrheumatic aortic valve stenosis.  Echocardiogram done in the hospital showed moderate aortic stenosis with mean gradient 26 peak 44 calculated aortic valve area of 1 cm 3. Late effect of CVA no new problems 4. Smoking she quit I encouraged her to abstain from smoking 5. Dyspnea on exertion improved after hospitalization 6. Chronic diastolic congestive heart failure I will get some teaching regarding weight check as well as fluid restriction and salt restriction.  I will check Chem-7 next week I will see her back in my office  in about 2 to 3 weeks   Medication Adjustments/Labs and Tests Ordered: Current medicines are reviewed at length with the patient today.  Concerns regarding medicines are outlined above.  Orders Placed This Encounter  Procedures  . Basic metabolic panel   Medication changes: No orders of the defined types were placed in this encounter.   Signed, Park Liter, MD, [facility] 07/31/2018 4:34 PM    [place]

## 2018-07-31 NOTE — Patient Instructions (Signed)
Medication Instructions:  Your physician recommends that you continue on your current medications as directed. Please refer to the Current Medication list given to you today.  If you need a refill on your cardiac medications before your next appointment, please call your pharmacy.   Lab work: Your physician recommends that you return for lab work today: bmp   If you have labs (blood work) drawn today and your tests are completely normal, you will receive your results only by: Marland Kitchen MyChart Message (if you have MyChart) OR . A paper copy in the mail If you have any lab test that is abnormal or we need to change your treatment, we will call you to review the results.  Testing/Procedures: None.   Follow-Up: At [facility], you and your health needs are our priority.  As part of our continuing mission to provide you with exceptional heart care, we have created designated Provider Care Teams.  These Care Teams include your primary Cardiologist (physician) and Advanced Practice Providers (APPs -  Physician Assistants and Nurse Practitioners) who all work together to provide you with the care you need, when you need it. You will need a follow up appointment in 2 weeks.  Please call our office 2 months in advance to schedule this appointment.  You may see No primary care provider on file. or another member of our [employer] Provider Team in [place]: Shirlee More, MD . Jyl Heinz, MD  Any Other Special Instructions Will Be Listed Below (If Applicable).

## 2018-08-04 DIAGNOSIS — D519 Vitamin B12 deficiency anemia, unspecified: Secondary | ICD-10-CM | POA: Diagnosis not present

## 2018-08-04 DIAGNOSIS — I10 Essential (primary) hypertension: Secondary | ICD-10-CM | POA: Diagnosis not present

## 2018-08-05 ENCOUNTER — Ambulatory Visit (INDEPENDENT_AMBULATORY_CARE_PROVIDER_SITE_OTHER): Payer: Medicare Other | Admitting: Internal Medicine

## 2018-08-05 ENCOUNTER — Encounter: Payer: Self-pay | Admitting: Internal Medicine

## 2018-08-05 ENCOUNTER — Other Ambulatory Visit: Payer: Self-pay

## 2018-08-05 DIAGNOSIS — J9611 Chronic respiratory failure with hypoxia: Secondary | ICD-10-CM | POA: Diagnosis not present

## 2018-08-05 DIAGNOSIS — I10 Essential (primary) hypertension: Secondary | ICD-10-CM

## 2018-08-05 DIAGNOSIS — R0609 Other forms of dyspnea: Secondary | ICD-10-CM | POA: Diagnosis not present

## 2018-08-05 MED ORDER — FAMOTIDINE 20 MG PO TABS: ORAL_TABLET | ORAL | 11 refills | Status: DC

## 2018-08-05 MED ORDER — PANTOPRAZOLE SODIUM 40 MG PO TBEC: 40.0000 mg | DELAYED_RELEASE_TABLET | Freq: Every day | ORAL | 2 refills | Status: DC

## 2018-08-05 NOTE — Patient Instructions (Addendum)
Continue off anoro and vasotec as you are   Pantoprazole (protonix) 40 mg   Take  30-60 min before first meal of the day and Pepcid (famotidine)  20 mg one  After supper  until return to office - this is the best way to tell whether stomach acid is contributing to your problem.   GERD (REFLUX)  is an extremely common cause of respiratory symptoms just like yours , many times with no obvious heartburn at all.    It can be treated with medication, but also with lifestyle changes including elevation of the head of your bed (ideally with 6 -8inch blocks under the headboard of your bed),  Smoking cessation, avoidance of late meals, excessive alcohol, and avoid fatty foods, chocolate, peppermint, colas, red wine, and acidic juices such as orange juice.  NO MINT OR MENTHOL PRODUCTS SO NO COUGH DROPS  USE SUGARLESS CANDY INSTEAD (Jolley ranchers or Stover's or Life Savers) or even ice chips will also do - the key is to swallow to prevent all throat clearing. NO OIL BASED VITAMINS - use powdered substitutes.  Avoid fish oil when coughing.    Only use your albuterol-ipatropium  as a rescue medication to be used if you can't catch your breath by resting or doing a relaxed purse lip breathing pattern.  - The less you use it, the better it will work when you need it. - Ok to use up to  every 4 hours if you must but call for immediate appointment if use goes up over your usual need - Don't leave home without it !!  (think of it like the spare tire for your car)    Wear 2lpm at bedtime and during the day as needed to keep your saturation above 90% and adjust the flow to keep it over 90% at all times to help your heart deliver 0xygen without having to strain.   Please schedule a follow up office visit in 4 weeks, sooner if needed  with all medications /inhalers/ solutions in hand so we can verify exactly what you are taking. This includes all medications from all doctors and over the counters

## 2018-08-05 NOTE — Progress Notes (Signed)
Annette Huang, female    DOB: Jan 11, 1928,   MRN: [id number]   Brief patient profile:  13 yowf  Quit smoking 07/22/2018 referred to pulmonary clinic 08/05/2018 by Dr  Lin Landsman for eval of cough/ sob ? All copd related though note last pfts on record >>>02/17/17    FEV1 1.35 [118%], ratio 0.82 with 14% resp to saba and TLC 93%, DLCO 73%     History of Present Illness  08/05/2018  Pulmonary/ 1st office eval/Annette Huang  - stopped vasotec/anoro prior to ov  Chief Complaint  Patient presents with  . Pulmonary Consult    Referred by Dr Lovette Cliche for eval of COPD. Pt c/o increased cough and SOB for the past few wks.   Dyspnea:  Can lean on buggy food lion = MMRC2 = can't walk a nl pace on a flat grade s sob but does fine slow and flat  Cough: some throat congestion  Sleep: wedge x 30 degrees  And 2lpm at hs  SABA use: duoneb 2-3 per day / was only taking prn anoro prior to stopping it completely Cough some better with prednisone but never compltely free of sense of throat congestion Has 02 not using x at hs 2lpm   No obvious day to day or daytime variability or assoc excess/ purulent sputum or mucus plugs or hemoptysis or cp or chest tightness, subjective wheeze or overt sinus or hb symptoms.   Sleeping as above  without nocturnal  or early am exacerbation  of respiratory  c/o's or need for noct saba. Also denies any obvious fluctuation of symptoms with weather or environmental changes or other aggravating or alleviating factors except as outlined above   No unusual exposure hx or h/o childhood pna/ asthma or knowledge of premature birth.  Current Allergies, Complete Past Medical History, Past Surgical History, Family History, and Social History were reviewed in [employer] record.  ROS  The following are not active complaints unless bolded Hoarseness, sore throat, dysphagia, dental problems, itching, sneezing,  nasal congestion or discharge of excess mucus or purulent  secretions, ear ache,   fever, chills, sweats, unintended wt loss or wt gain, classically pleuritic or exertional cp,  orthopnea pnd or arm/hand swelling  or leg swelling, presyncope, palpitations, abdominal pain, anorexia, nausea, vomiting, diarrhea  or change in bowel habits or change in bladder habits, change in stools or change in urine, dysuria, hematuria,  rash, arthralgias, visual complaints, headache, numbness, weakness or ataxia or problems with walking or coordination,  change in mood or  memory.            Past Medical History:  Diagnosis Date  . COPD (chronic obstructive pulmonary disease) ([place])   . Coronary artery disease   . CVA (cerebral vascular accident) ([place])   . Hyperlipidemia   . Hypertension   . Thoracic aortic atherosclerosis [facility])     Outpatient Medications Prior to Visit  Medication Sig Dispense Refill  . amLODipine (NORVASC) 5 MG tablet Take 5 mg daily by mouth.    Marland Kitchen atorvastatin (LIPITOR) 40 MG tablet Please take 1 tablet, once a day 90 tablet 2  . clopidogrel (PLAVIX) 75 MG tablet Take 75 mg daily by mouth.    . furosemide (LASIX) 40 MG tablet Take 40 mg daily by mouth.    Marland Kitchen ipratropium-albuterol (DUONEB) 0.5-2.5 (3) MG/3ML SOLN Take 3 mLs by nebulization every 4 (four) hours as needed.    Marland Kitchen levothyroxine (SYNTHROID, LEVOTHROID) 50 MCG tablet Take 1  tablet daily by mouth.    . metoprolol tartrate (LOPRESSOR) 25 MG tablet Take 12.5 mg by mouth 2 (two) times daily.    . Vitamin D, Ergocalciferol, (DRISDOL) 1.25 MG (50000 UT) CAPS capsule Take 50,000 Units by mouth every 7 (seven) days.    . predniSONE (DELTASONE) 20 MG tablet TAKE 2 TABLETS BY MOUTH DAILY FOR 4 DAYS           Objective:     BP (!) 144/70 (BP Location: Left Arm, Cuff Size: Normal)   Pulse 72   Temp (!) 97.4 F (36.3 C) (Oral)   Ht 4\' 11"  (1.499 m)   Wt 151 lb (68.5 kg)   SpO2 92%   BMI 30.50 kg/m   SpO2: 92 %  RA   Pleasant amb slt hoarse bf moderate pseudowheeze   III/VI    HEENT: nl dentition, turbinates bilaterally, and oropharynx. Nl external ear canals without cough reflex   NECK :  without JVD/Nodes/TM/ nl carotid upstrokes bilaterally   LUNGS: no acc muscle use,  Nl contour chest which is clear to A and P bilaterally without cough on insp or exp maneuvers   CV:  RRR  no s3    III/VI SEM s  increase in P2, and no edema   ABD:  soft and nontender with nl inspiratory excursion in the supine position. No bruits or organomegaly appreciated, bowel sounds nl  MS:  Nl gait/ ext warm without deformities, calf tenderness, cyanosis or clubbing No obvious joint restrictions   SKIN: warm and dry without lesions    NEURO:  alert, approp, nl sensorium with  no motor or cerebellar deficits apparent.     I personally reviewed images and agree with radiology impression as follows:   Chest CT 06/11/17 Linear atelectasis involving the LEFT lower lobe. Stable chronic scarring involving the RIGHT middle lobe. Visualized lung parenchyma otherwise clear. Emphysematous changes in both Lungs.> no change ct 07/29/18     Assessment   DOE (dyspnea on exertion) Quit smoking 07/22/18 with emphysema on ct from 06/12/2018 - PFT 02/17/17    FEV1 1.35 [118%], ratio 0.82 with 14% resp to saba and TLC 93%, DLCO 73% - Echo 01/20/2018 c/w mod AS s PH  >>>> pt stopped vasotec and anoro on her own prior to pulmonary ov 08/05/2018 > rec leave off and rx for gerd x 4 weeks then f/u   Symptoms are   disproportionate to objective findings for copd and not clear to what extent this is actually a pulmonary  problem but pt does appear to have difficult to sort out respiratory symptoms of unknown origin for which  DDX  = almost all start with A and  include Adherence, Ace Inhibitors, Acid Reflux, Active Sinus Disease, Alpha 1 Antitripsin deficiency, Anxiety masquerading as Airways dz,  ABPA,  Allergy(esp in young), Aspiration (esp in elderly), Adverse effects of meds,  Active smoking or Vaping, A  bunch of PE's/clot burden (a few small clots can't cause this syndrome unless there is already severe underlying pulm or vascular dz with poor reserve),  Anemia or thyroid disorder, plus two Bs  = Bronchiectasis and Beta blocker use..and one C= CHF   Adherence is always the initial "prime suspect" and is a multilayered concern that requires a "trust but verify" approach in every patient - starting with knowing how to use medications, especially inhalers, correctly, keeping up with refills and understanding the fundamental difference between maintenance and prns vs those medications only taken for  a very short course and then stopped and not refilled.  - return with all meds in hand using a trust but verify approach to confirm accurate Medication  Reconciliation The principal here is that until we are certain that the  patients are doing what we've asked, it makes no sense to ask them to do more.    ACEi adverse effects at the  top of the usual list of suspects and the only way to rule it out is a trial off > keep off for now but advised will need close bp f/u  ? Adverse effects of other meds > leave off dpi Anoro for now as may aggravate her pseudowheeze and her pfts don't show much asthma and don't meet the criteria for copd> just use duoneb prn for now  ? Active smoking >   I reviewed the Fletcher curve with the patient that basically indicates  if you quit smoking when your best day FEV1 is still well preserved (as is clearly  the case here)  it is highly unlikely you will progress to severe disease and informed the patient there was  no medication on the market that has proven to alter the curve/ its downward trajectory  or the likelihood of progression of their disease(unlike other chronic medical conditions such as atheroclerosis where we do think we can change the natural hx with risk reducing meds)    Therefore  maintaining abstinence at this point is  the most important aspect of her care, not  choice of inhalers or for that matter, doctors.   Treatment other than smoking cessation  is entirely directed by severity of symptoms and focused also on reducing exacerbations, not attempting to change the natural history of the disease.  >>> duoneb prn only  ? BB effects > unlikely on such low doses lopressor  ? chf > def has diastolic dysfunction from AS/ f/u cards important as is keeping 02 sats up in this setting (see separate a/p)     Chronic respiratory failure with hypoxia (HCC) 02 hs x 2lpm as of 08/05/2018 and prn daytime to keep sats > 90%   >>> Explained in the presence of limited CO need to be sure the blood she is pumping through the stenotic valve  at high pressure is also at high sats at all times.    Essential hypertension rec Off acei permanently as of 08/05/2018 due to pseudowheeze   In the best review of chronic cough to date ( NEJM 2016 375 [id number]) ,  ACEi are now felt to cause cough in up to  20% of pts which is a 4 fold increase from previous reports and does not include the variety of non-specific complaints we see in pulmonary clinic in pts on ACEi but previously attributed to another dx like  Copd/asthma and  include PNDS, throat  congestion, "bronchitis", unexplained dyspnea and noct "strangling" sensations, and hoarseness, but also  atypical /refractory GERD symptoms like dysphagia and "bad heartburn"   The only way I know  to prove this is not an "ACEi Case" is a trial off ACEi x a minimum of 4 weeks then regroup > monitor bp in meantime      Total time devoted to counseling  > 50 % of initial 60 min office visit:  review case with pt/  discussion of options/alternatives/ personally creating written customized instructions  in presence of pt  then going over those specific  Instructions directly with the pt including how to use  all of the meds but in particular covering each new medication in detail and the difference between the maintenance= "automatic" meds  and the prns using an action plan format for the latter (If this problem/symptom => do that organization reading Left to right).  Please see AVS from this visit for a full list of these instructions which I personally wrote for this pt and  are unique to this visit.        Christinia Gully, MD 08/05/2018

## 2018-08-06 ENCOUNTER — Encounter: Payer: Self-pay | Admitting: Internal Medicine

## 2018-08-06 DIAGNOSIS — I10 Essential (primary) hypertension: Secondary | ICD-10-CM | POA: Diagnosis not present

## 2018-08-06 DIAGNOSIS — I350 Nonrheumatic aortic (valve) stenosis: Secondary | ICD-10-CM | POA: Diagnosis not present

## 2018-08-06 DIAGNOSIS — I509 Heart failure, unspecified: Secondary | ICD-10-CM | POA: Diagnosis not present

## 2018-08-06 DIAGNOSIS — J9611 Chronic respiratory failure with hypoxia: Secondary | ICD-10-CM | POA: Insufficient documentation

## 2018-08-06 DIAGNOSIS — Z6830 Body mass index (BMI) 30.0-30.9, adult: Secondary | ICD-10-CM | POA: Diagnosis not present

## 2018-08-06 HISTORY — DX: Chronic respiratory failure with hypoxia: J96.11

## 2018-08-06 NOTE — Assessment & Plan Note (Signed)
Quit smoking 07/22/18 with emphysema on ct from 06/12/2018 - PFT 02/17/17    FEV1 1.35 [118%], ratio 0.82 with 14% resp to saba and TLC 93%, DLCO 73% - Echo 01/20/2018 c/w mod AS s PH - pt stopped vasotec and anoro on her own prior to pulmonary ov 08/05/2018 > rec leave off and rx for gerd x 4 weeks   Symptoms are   disproportionate to objective findings for copd and not clear to what extent this is actually a pulmonary  problem but pt does appear to have difficult to sort out respiratory symptoms of unknown origin for which  DDX  = almost all start with A and  include Adherence, Ace Inhibitors, Acid Reflux, Active Sinus Disease, Alpha 1 Antitripsin deficiency, Anxiety masquerading as Airways dz,  ABPA,  Allergy(esp in young), Aspiration (esp in elderly), Adverse effects of meds,  Active smoking or Vaping, A bunch of PE's/clot burden (a few small clots can't cause this syndrome unless there is already severe underlying pulm or vascular dz with poor reserve),  Anemia or thyroid disorder, plus two Bs  = Bronchiectasis and Beta blocker use..and one C= CHF   Adherence is always the initial "prime suspect" and is a multilayered concern that requires a "trust but verify" approach in every patient - starting with knowing how to use medications, especially inhalers, correctly, keeping up with refills and understanding the fundamental difference between maintenance and prns vs those medications only taken for a very short course and then stopped and not refilled.  - return with all meds in hand using a trust but verify approach to confirm accurate Medication  Reconciliation The principal here is that until we are certain that the  patients are doing what we've asked, it makes no sense to ask them to do more.    ACEi adverse effects at the  top of the usual list of suspects and the only way to rule it out is a trial off > keep off for now but advised will need close bp f/u  ? Adverse effects of other meds > leave off  dpi Anoro for now as may aggravate her pseudowheeze and her pfts don't show much asthma and don't meet the criteria for copd> just use duoneb prn for now  ? Active smoking >   I reviewed the Fletcher curve with the patient that basically indicates  if you quit smoking when your best day FEV1 is still well preserved (as is clearly  the case here)  it is highly unlikely you will progress to severe disease and informed the patient there was  no medication on the market that has proven to alter the curve/ its downward trajectory  or the likelihood of progression of their disease(unlike other chronic medical conditions such as atheroclerosis where we do think we can change the natural hx with risk reducing meds)    Therefore  maintaining abstinence at this point is  the most important aspect of her care, not choice of inhalers or for that matter, doctors.   Treatment other than smoking cessation  is entirely directed by severity of symptoms and focused also on reducing exacerbations, not attempting to change the natural history of the disease.  >>> duoneb prn only  ? BB effects > unlikely on such low doses lopressor  ? chf > def has diastolic dysfunction from AS/ f/u cards important as is keeping 02 sats up in this setting (see separate a/p)

## 2018-08-06 NOTE — Assessment & Plan Note (Addendum)
02 hs x 2lpm as of 08/05/2018 and prn daytime to keep sats > 90%   >>> Explained in the presence of limited CO need to be sure the blood she is pumping through the stenotic valve  at high pressure is also at high sats at all times.    Total time devoted to counseling  > 50 % of initial 60 min office visit:  review case with pt/  discussion of options/alternatives/ personally creating written customized instructions  in presence of pt  then going over those specific  Instructions directly with the pt including how to use all of the meds but in particular covering each new medication in detail and the difference between the maintenance= "automatic" meds and the prns using an action plan format for the latter (If this problem/symptom => do that organization reading Left to right).  Please see AVS from this visit for a full list of these instructions which I personally wrote for this pt and  are unique to this visit.

## 2018-08-06 NOTE — Assessment & Plan Note (Signed)
rec Off acei permanently as of 08/05/2018 due to pseudowheeze   In the best review of chronic cough to date ( NEJM 2016 375 [id number]) ,  ACEi are now felt to cause cough in up to  20% of pts which is a 4 fold increase from previous reports and does not include the variety of non-specific complaints we see in pulmonary clinic in pts on ACEi but previously attributed to another dx like  Copd/asthma and  include PNDS, throat  congestion, "bronchitis", unexplained dyspnea and noct "strangling" sensations, and hoarseness, but also  atypical /refractory GERD symptoms like dysphagia and "bad heartburn"   The only way I know  to prove this is not an "ACEi Case" is a trial off ACEi x a minimum of 4 weeks then regroup > monitor bp in meantime

## 2018-08-10 ENCOUNTER — Ambulatory Visit: Payer: Medicare Other | Admitting: Cardiology

## 2018-08-28 ENCOUNTER — Encounter: Payer: Self-pay | Admitting: Cardiology

## 2018-08-28 ENCOUNTER — Ambulatory Visit (INDEPENDENT_AMBULATORY_CARE_PROVIDER_SITE_OTHER): Payer: Medicare Other | Admitting: Cardiology

## 2018-08-28 ENCOUNTER — Other Ambulatory Visit: Payer: Self-pay

## 2018-08-28 VITALS — BP 120/58 | HR 55 | Ht 59.0 in | Wt 153.4 lb

## 2018-08-28 DIAGNOSIS — I6990 Unspecified sequelae of unspecified cerebrovascular disease: Secondary | ICD-10-CM | POA: Diagnosis not present

## 2018-08-28 DIAGNOSIS — I10 Essential (primary) hypertension: Secondary | ICD-10-CM

## 2018-08-28 DIAGNOSIS — I350 Nonrheumatic aortic (valve) stenosis: Secondary | ICD-10-CM | POA: Diagnosis not present

## 2018-08-28 DIAGNOSIS — F17200 Nicotine dependence, unspecified, uncomplicated: Secondary | ICD-10-CM

## 2018-08-28 DIAGNOSIS — I5032 Chronic diastolic (congestive) heart failure: Secondary | ICD-10-CM

## 2018-08-28 DIAGNOSIS — I739 Peripheral vascular disease, unspecified: Secondary | ICD-10-CM | POA: Insufficient documentation

## 2018-08-28 DIAGNOSIS — J9611 Chronic respiratory failure with hypoxia: Secondary | ICD-10-CM | POA: Diagnosis not present

## 2018-08-28 HISTORY — DX: Peripheral vascular disease, unspecified: I73.9

## 2018-08-28 NOTE — Patient Instructions (Signed)
Medication Instructions:  Your physician recommends that you continue on your current medications as directed. Please refer to the Current Medication list given to you today.  If you need a refill on your cardiac medications before your next appointment, please call your pharmacy.   Lab work: Your physician recommends that you return for lab work today: bmp, bnp   If you have labs (blood work) drawn today and your tests are completely normal, you will receive your results only by: Marland Kitchen MyChart Message (if you have MyChart) OR . A paper copy in the mail If you have any lab test that is abnormal or we need to change your treatment, we will call you to review the results.  Testing/Procedures:Your physician has requested that you have a carotid duplex. This test is an ultrasound of the carotid arteries in your neck. It looks at blood flow through these arteries that supply the brain with blood. Allow one hour for this exam. There are no restrictions or special instructions.    Follow-Up: At [facility], you and your health needs are our priority.  As part of our continuing mission to provide you with exceptional heart care, we have created designated Provider Care Teams.  These Care Teams include your primary Cardiologist (physician) and Advanced Practice Providers (APPs -  Physician Assistants and Nurse Practitioners) who all work together to provide you with the care you need, when you need it. You will need a follow up appointment in 2 months.  Please call our office 2 months in advance to schedule this appointment.  You may see No primary care provider on file. or another member of our [employer] Provider Team in [place]: Shirlee More, MD . Jyl Heinz, MD  Any Other Special Instructions Will Be Listed Below (If Applicable).

## 2018-08-28 NOTE — Progress Notes (Signed)
Cardiology Office Note:    Date:  08/28/2018   ID:  Annette Huang, DOB [birth year], MRN [id number]  PCP:  Angelina Sheriff, MD  Cardiologist:  Jenne Campus, MD    Referring MD: Angelina Sheriff, MD   Chief Complaint  Patient presents with  . Follow-up  Doing very well  History of Present Illness:    Annette Huang is a 85 y.o. female with complex past medical history which include diastolic congestive heart failure, peripheral vascular disease, history of CVA.  Comes to my office for follow-up last time I seen her she just left hospital when she was hospitalized because of exacerbation of COPD.  Now she is doing well denies having any chest pain tightness squeezing pressure burning chest.  Sadly she started smoking again.  I told her this is unacceptable that she need to stop she understands she will try to do it overall however she feels good.  Past Medical History:  Diagnosis Date  . COPD (chronic obstructive pulmonary disease) ([place])   . Coronary artery disease   . CVA (cerebral vascular accident) ([place])   . Hyperlipidemia   . Hypertension   . Thoracic aortic atherosclerosis [facility])     Past Surgical History:  Procedure Laterality Date  . NECK SURGERY      Current Medications: Current Meds  Medication Sig  . amLODipine (NORVASC) 5 MG tablet Take 5 mg daily by mouth.  Marland Kitchen atorvastatin (LIPITOR) 40 MG tablet Please take 1 tablet, once a day  . clopidogrel (PLAVIX) 75 MG tablet Take 75 mg daily by mouth.  . famotidine (PEPCID) 20 MG tablet One after supper  . furosemide (LASIX) 40 MG tablet Take 40 mg daily by mouth.  Marland Kitchen ipratropium-albuterol (DUONEB) 0.5-2.5 (3) MG/3ML SOLN Take 3 mLs by nebulization every 4 (four) hours as needed.  Marland Kitchen levothyroxine (SYNTHROID, LEVOTHROID) 50 MCG tablet Take 1 tablet daily by mouth.  . metoprolol tartrate (LOPRESSOR) 25 MG tablet Take 12.5 mg by mouth 2 (two) times daily.  . pantoprazole (PROTONIX) 40 MG tablet Take 1 tablet (40  mg total) by mouth daily. Take 30-60 min before first meal of the day  . Vitamin D, Ergocalciferol, (DRISDOL) 1.25 MG (50000 UT) CAPS capsule Take 50,000 Units by mouth every 7 (seven) days.     Allergies:   Codeine   Social History   Socioeconomic History  . Marital status: Married    Spouse name: Not on file  . Number of children: Not on file  . Years of education: Not on file  . Highest education level: Not on file  Occupational History  . Not on file  Social Needs  . Financial resource strain: Not on file  . Food insecurity    Worry: Not on file    Inability: Not on file  . Transportation needs    Medical: Not on file    Non-medical: Not on file  Tobacco Use  . Smoking status: Former Smoker    Packs/day: 1.00    Years: 35.00    Pack years: 35.00    Types: Cigarettes    Quit date: 07/22/2018    Years since quitting: 0.1  . Smokeless tobacco: Never Used  Substance and Sexual Activity  . Alcohol use: No    Frequency: Never  . Drug use: No  . Sexual activity: Not on file  Lifestyle  . Physical activity    Days per week: Not on file    Minutes per session:  Not on file  . Stress: Not on file  Relationships  . Social Herbalist on phone: Not on file    Gets together: Not on file    Attends religious service: Not on file    Active member of club or organization: Not on file    Attends meetings of clubs or organizations: Not on file    Relationship status: Not on file  Other Topics Concern  . Not on file  Social History Narrative  . Not on file     Family History: The patient's family history includes Heart disease in her father and mother; Hypertension in her mother. ROS:   Please see the history of present illness.    All 14 point review of systems negative except as described per history of present illness  EKGs/Labs/Other Studies Reviewed:      Recent Labs: No results found for requested labs within last 8760 hours.  Recent Lipid Panel No  results found for: CHOL, TRIG, HDL, CHOLHDL, VLDL, LDLCALC, LDLDIRECT  Physical Exam:    VS:  BP (!) 120/58   Pulse (!) 55   Ht 4\' 11"  (1.499 m)   Wt 153 lb 6.4 oz (69.6 kg)   SpO2 96%   BMI 30.98 kg/m     Wt Readings from Last 3 Encounters:  08/28/18 153 lb 6.4 oz (69.6 kg)  08/05/18 151 lb (68.5 kg)  07/31/18 156 lb (70.8 kg)     GEN:  Well nourished, well developed in no acute distress HEENT: Normal NECK: No JVD; No carotid bruits LYMPHATICS: No lymphadenopathy CARDIAC: RRR, systolic ejection murmur grade 2/6 best heard at right upper portion of the sternum, no rubs, no gallops RESPIRATORY:  Clear to auscultation without rales, wheezing or rhonchi  ABDOMEN: Soft, non-tender, non-distended MUSCULOSKELETAL:  No edema; No deformity  SKIN: Warm and dry LOWER EXTREMITIES: no swelling NEUROLOGIC:  Alert and oriented x 3 PSYCHIATRIC:  Normal affect   ASSESSMENT:    1. Chronic diastolic congestive heart failure ([place])   2. Essential hypertension   3. Nonrheumatic aortic valve stenosis   4. Chronic respiratory failure with hypoxia (HCC)   5. Smoking   6. Late effects of CVA (cerebrovascular accident)   7. Peripheral vascular disease, unspecified ([place]) status post right carotic endarterectomy    PLAN:    In order of problems listed above:  1. Chronic diastolic congestive heart for Chem-7 and proBNP will be checked today. 2. Essential hypertension blood pressure well controlled continue present management 3. Nonrheumatic aortic valve stenosis, moderate based on last echocardiogram we will continue monitoring 4. Chronic respiratory failure followed by pulmonary overall improving 5. Smoking we spent at least 5 minutes talking about the need to quit she understands she will try to do it 6. Late effects of CVA noted no new issues 7. Peripheral vascular disease status post right carotic endarterectomy will do carotic ultrasound   Medication Adjustments/Labs and Tests Ordered:  Current medicines are reviewed at length with the patient today.  Concerns regarding medicines are outlined above.  No orders of the defined types were placed in this encounter.  Medication changes: No orders of the defined types were placed in this encounter.   Signed, Park Liter, MD, [facility] 08/28/2018 10:47 AM    [place]

## 2018-08-29 LAB — BASIC METABOLIC PANEL
BUN/Creatinine Ratio: 14 (ref 12–28)
BUN: 17 mg/dL (ref 8–27)
CO2: 27 mmol/L (ref 20–29)
Calcium: 9.6 mg/dL (ref 8.7–10.3)
Chloride: 102 mmol/L (ref 96–106)
Creatinine, Ser: 1.25 mg/dL — ABNORMAL HIGH (ref 0.57–1.00)
GFR calc Af Amer: 44 mL/min/{1.73_m2} — ABNORMAL LOW (ref >59)
GFR calc non Af Amer: 38 mL/min/{1.73_m2} — ABNORMAL LOW (ref >59)
Glucose: 97 mg/dL (ref 65–99)
Potassium: 4 mmol/L (ref 3.5–5.2)
Sodium: 143 mmol/L (ref 134–144)

## 2018-08-29 LAB — PRO B NATRIURETIC PEPTIDE: NT-Pro BNP: 463 pg/mL (ref 0–738)

## 2018-09-01 ENCOUNTER — Other Ambulatory Visit: Payer: Self-pay

## 2018-09-01 ENCOUNTER — Encounter: Payer: Self-pay | Admitting: Internal Medicine

## 2018-09-01 ENCOUNTER — Ambulatory Visit (INDEPENDENT_AMBULATORY_CARE_PROVIDER_SITE_OTHER): Payer: Medicare Other | Admitting: Internal Medicine

## 2018-09-01 DIAGNOSIS — J9611 Chronic respiratory failure with hypoxia: Secondary | ICD-10-CM

## 2018-09-01 DIAGNOSIS — R0609 Other forms of dyspnea: Secondary | ICD-10-CM

## 2018-09-01 NOTE — Progress Notes (Signed)
Annette Huang, female    DOB: July 07, 1927,   MRN: [id number]   Brief patient profile:  27 yowf  Quit smoking 07/22/2018 referred to pulmonary clinic 08/05/2018 by Dr  Lin Landsman for eval of cough/ sob ? All copd related though note last pfts on record >>>02/17/17    FEV1 1.35 [118%], ratio 0.82 with 14% resp to saba and TLC 93%, DLCO 73%     History of Present Illness  08/05/2018  Pulmonary/ 1st office eval/Javoris Star  - stopped vasotec/anoro prior to ov  Chief Complaint  Patient presents with  . Pulmonary Consult    Referred by Dr Lovette Cliche for eval of COPD. Pt c/o increased cough and SOB for the past few wks.   Dyspnea:  Can lean on buggy food lion = MMRC2 = can't walk a nl pace on a flat grade s sob but does fine slow and flat  Cough: some throat congestion  Sleep: wedge x 30 degrees  And 2lpm at hs  SABA use: duoneb 2-3 per day / was only taking prn anoro prior to stopping it completely Cough some better with prednisone but never compltely free of sense of throat congestion Has 02 not using x at hs 2lpm rec Continue off anoro and vasotec as you are  Pantoprazole (protonix) 40 mg   Take  30-60 min before first meal of the day and Pepcid (famotidine)  20 mg one  After supper  until return to office - this is the best way to tell whether stomach acid is contributing to your problem.  GERD diet  Only use your albuterol-ipatropium  as a rescue medication   Wear 2lpm at bedtime and during the day as needed to keep your saturation above 90% and adjust the flow to keep it over 90% at all times to help your heart deliver 0xygen without having to strain.    09/01/2018  f/u ov/Annette Huang re: doe/ min copd clinically  Chief Complaint  Patient presents with  . Follow-up    Breathing is overall doing well and she has not used her neb or o2 recently.   Dyspnea:  Better but not very active Cough: none / throat congetstion is better  Sleeping: able to lie flat one pillow SABA use: none  02: just  sometimes at hs / not checking sats with activity    No obvious day to day or daytime variability or assoc excess/ purulent sputum or mucus plugs or hemoptysis or cp or chest tightness, subjective wheeze or overt sinus or hb symptoms.   Sleeping flat  without nocturnal  or early am exacerbation  of respiratory  c/o's or need for noct saba. Also denies any obvious fluctuation of symptoms with weather or environmental changes or other aggravating or alleviating factors except as outlined above   No unusual exposure hx or h/o childhood pna/ asthma or knowledge of premature birth.  Current Allergies, Complete Past Medical History, Past Surgical History, Family History, and Social History were reviewed in [employer] record.  ROS  The following are not active complaints unless bolded Hoarseness, sore throat, dysphagia, dental problems, itching, sneezing,  nasal congestion or discharge of excess mucus or purulent secretions, ear ache,   fever, chills, sweats, unintended wt loss or wt gain, classically pleuritic or exertional cp,  orthopnea pnd or arm/hand swelling  or leg swelling, presyncope, palpitations, abdominal pain, anorexia, nausea, vomiting, diarrhea  or change in bowel habits or change in bladder habits, change in stools or  change in urine, dysuria, hematuria,  rash, arthralgias, visual complaints, headache, numbness, weakness or ataxia or problems with walking or coordination,  change in mood or  memory.        Current Meds  Medication Sig  . amLODipine (NORVASC) 5 MG tablet Take 5 mg daily by mouth.  Marland Kitchen atorvastatin (LIPITOR) 40 MG tablet Please take 1 tablet, once a day  . clopidogrel (PLAVIX) 75 MG tablet Take 75 mg daily by mouth.  . famotidine (PEPCID) 20 MG tablet One after supper  . furosemide (LASIX) 40 MG tablet Take 40 mg daily by mouth.  Marland Kitchen ipratropium-albuterol (DUONEB) 0.5-2.5 (3) MG/3ML SOLN Take 3 mLs by nebulization every 4 (four) hours as needed.  Marland Kitchen  levothyroxine (SYNTHROID, LEVOTHROID) 50 MCG tablet Take 1 tablet daily by mouth.  . metoprolol tartrate (LOPRESSOR) 25 MG tablet Take 12.5 mg by mouth 2 (two) times daily.  . OXYGEN 2lpm as needed  . pantoprazole (PROTONIX) 40 MG tablet Take 1 tablet (40 mg total) by mouth daily. Take 30-60 min before first meal of the day  . Vitamin D, Ergocalciferol, (DRISDOL) 1.25 MG (50000 UT) CAPS capsule Take 50,000 Units by mouth every 7 (seven) days.              Past Medical History:  Diagnosis Date  . COPD (chronic obstructive pulmonary disease) ([place])   . Coronary artery disease   . CVA (cerebral vascular accident) ([place])   . Hyperlipidemia   . Hypertension   . Thoracic aortic atherosclerosis ([facility])       Objective:      amb wf easily confused with details of care   Wt Readings from Last 3 Encounters:  09/01/18 153 lb (69.4 kg)  08/28/18 153 lb 6.4 oz (69.6 kg)  08/05/18 151 lb (68.5 kg)     Vital signs reviewed - Note on arrival 02 sats  96% on RA          CV:  RRR  no s3    III/VI SEM s  increase in P2, and no edema       I personally reviewed images and agree with radiology impression as follows:   Chest CT 06/11/17 Linear atelectasis involving the LEFT lower lobe. Stable chronic scarring involving the RIGHT middle lobe. Visualized lung parenchyma otherwise clear. Emphysematous changes in both Lungs.> no change ct 07/29/18     Assessment

## 2018-09-01 NOTE — Patient Instructions (Signed)
Wear 02 at bedtime  X 2lpm and as needed during the day for short of breath or 02 level less than 90%  Nebulizer is only as needed    If you are satisfied with your treatment plan,  let your doctor know and he/she can either refill your medications or you can return here when your prescription runs out.     If in any way you are not 100% satisfied,  please tell us.  If 100% better, tell your friends!  Pulmonary follow up is as needed

## 2018-09-02 ENCOUNTER — Encounter: Payer: Self-pay | Admitting: Internal Medicine

## 2018-09-02 NOTE — Assessment & Plan Note (Addendum)
Quit smoking 07/22/18 with emphysema on ct from 06/12/2018 - PFT 02/17/17    FEV1 1.35 [118%], ratio 0.82 with 14% resp to saba and TLC 93%, DLCO 73% - Echo 01/20/2018 c/w mod AS s PH - pt stopped vasotec and anoro on her own prior to pulmonary ov 08/05/2018 > rec leave off and rx for gerd x 4 weeks > improved as of 09/01/2018  - 09/01/2018   Walked RA x one lap =  approx 250 ft avg pace >> stopped due to  Back pain with sats still 94% and min sob off all resp rx   At this point no evidence of limiting sob   As I explained to this patient in detail:  although there may be copd present, it may not be clinically relevant:   it does not appear to be limiting activity tolerance any more than a set of worn tires limits someone from driving a car around a parking lot.  A new set of Michelins might look good but would have no perceived impact on the performance of the car and would not be worth the cost.  That is to say:   this pt is so sedentary due to back problems  I don't recommend aggressive pulmonary rx at this point unless limiting symptoms arise or acute exacerbations become as issue, neither of which is the case now.  I asked the patient to contact this office at any time in the future should either of these problems arise.    >>>>   Ok to just use duoneb prn /f/u prn

## 2018-09-02 NOTE — Assessment & Plan Note (Signed)
02 hs x 2lpm as of 08/05/2018 and prn daytime to keep sats > 90%  - 09/01/2018 no desats walking / limited by back   Adequate control on present rx, reviewed in detail with pt > no change in rx needed  = 2lpm hs and prn during the day if any desats as also has AS with limited CO reserve/ reviewed   I had an extended discussion with the patient  reviewing all relevant studies completed to date and  lasting 15 to 20 minutes of a 25 minute visit  which included directly observing ambulatory 02 saturation study documented in a/p section of  today's  office note.  Each maintenance medication was reviewed in detail including most importantly the difference between maintenance and prns and under what circumstances the prns are to be triggered using an action plan format that is not reflected in the computer generated alphabetically organized AVS.     Please see AVS for specific instructions unique to this visit that I personally wrote and verbalized to the the pt in detail and then reviewed with pt  by my nurse highlighting any changes in therapy recommended at today's visit .

## 2018-09-04 DIAGNOSIS — E782 Mixed hyperlipidemia: Secondary | ICD-10-CM | POA: Diagnosis not present

## 2018-09-04 DIAGNOSIS — I10 Essential (primary) hypertension: Secondary | ICD-10-CM | POA: Diagnosis not present

## 2018-09-30 DIAGNOSIS — F17200 Nicotine dependence, unspecified, uncomplicated: Secondary | ICD-10-CM | POA: Diagnosis not present

## 2018-09-30 DIAGNOSIS — E7800 Pure hypercholesterolemia, unspecified: Secondary | ICD-10-CM | POA: Diagnosis not present

## 2018-09-30 DIAGNOSIS — I10 Essential (primary) hypertension: Secondary | ICD-10-CM | POA: Diagnosis not present

## 2018-09-30 DIAGNOSIS — Z6831 Body mass index (BMI) 31.0-31.9, adult: Secondary | ICD-10-CM | POA: Diagnosis not present

## 2018-09-30 DIAGNOSIS — Z23 Encounter for immunization: Secondary | ICD-10-CM | POA: Diagnosis not present

## 2018-09-30 DIAGNOSIS — N289 Disorder of kidney and ureter, unspecified: Secondary | ICD-10-CM | POA: Diagnosis not present

## 2018-10-05 DIAGNOSIS — I10 Essential (primary) hypertension: Secondary | ICD-10-CM | POA: Diagnosis not present

## 2018-10-05 DIAGNOSIS — E785 Hyperlipidemia, unspecified: Secondary | ICD-10-CM | POA: Diagnosis not present

## 2018-10-06 DIAGNOSIS — Z6831 Body mass index (BMI) 31.0-31.9, adult: Secondary | ICD-10-CM | POA: Diagnosis not present

## 2018-10-06 DIAGNOSIS — N390 Urinary tract infection, site not specified: Secondary | ICD-10-CM | POA: Diagnosis not present

## 2018-10-07 ENCOUNTER — Telehealth: Payer: Self-pay | Admitting: Cardiology

## 2018-10-07 NOTE — Telephone Encounter (Signed)
Called patient she reports Dr. Lin Landsman told her to stop lasix she currently takes 40 mg daily. He told her to stop and take only when she needs it because "her kidneys are better". Patient is unsure about this decision and doesn't think it is a good idea for her to stop and she wants Dr. Wendy Poet recommendation. Will call [facility] and get lab results and have Dr. Agustin Cree follow up.

## 2018-10-07 NOTE — Telephone Encounter (Signed)
Her PCP wants to take her off Lasix and she's concerned

## 2018-10-07 NOTE — Telephone Encounter (Signed)
Requested labs from [place].

## 2018-10-08 NOTE — Telephone Encounter (Signed)
Patient's daughter Baker Janus informed per dpr to have patient continue lasix 40 mg daily per Dr. Fraser Din after reviewing her labs. She verbally understood no further questions.

## 2018-11-04 DIAGNOSIS — M47896 Other spondylosis, lumbar region: Secondary | ICD-10-CM | POA: Diagnosis not present

## 2018-11-04 DIAGNOSIS — M461 Sacroiliitis, not elsewhere classified: Secondary | ICD-10-CM | POA: Diagnosis not present

## 2018-11-04 DIAGNOSIS — M48061 Spinal stenosis, lumbar region without neurogenic claudication: Secondary | ICD-10-CM | POA: Diagnosis not present

## 2018-11-04 DIAGNOSIS — M5136 Other intervertebral disc degeneration, lumbar region: Secondary | ICD-10-CM | POA: Diagnosis not present

## 2018-11-04 DIAGNOSIS — I10 Essential (primary) hypertension: Secondary | ICD-10-CM | POA: Diagnosis not present

## 2018-12-14 ENCOUNTER — Other Ambulatory Visit: Payer: Self-pay

## 2018-12-14 ENCOUNTER — Ambulatory Visit (INDEPENDENT_AMBULATORY_CARE_PROVIDER_SITE_OTHER): Payer: Medicare Other

## 2018-12-14 DIAGNOSIS — I739 Peripheral vascular disease, unspecified: Secondary | ICD-10-CM

## 2018-12-14 DIAGNOSIS — I5032 Chronic diastolic (congestive) heart failure: Secondary | ICD-10-CM | POA: Diagnosis not present

## 2018-12-14 DIAGNOSIS — I350 Nonrheumatic aortic (valve) stenosis: Secondary | ICD-10-CM | POA: Diagnosis not present

## 2018-12-14 DIAGNOSIS — I10 Essential (primary) hypertension: Secondary | ICD-10-CM | POA: Diagnosis not present

## 2018-12-14 NOTE — Progress Notes (Signed)
Carotid duplex exam has been performed. Status post right carotic endarterectomy, left ICA 40-59% stenosed.  Jimmy Redith Drach RDCS, RVT

## 2018-12-18 DIAGNOSIS — N390 Urinary tract infection, site not specified: Secondary | ICD-10-CM | POA: Diagnosis not present

## 2018-12-18 DIAGNOSIS — Z6831 Body mass index (BMI) 31.0-31.9, adult: Secondary | ICD-10-CM | POA: Diagnosis not present

## 2018-12-21 ENCOUNTER — Ambulatory Visit (INDEPENDENT_AMBULATORY_CARE_PROVIDER_SITE_OTHER): Payer: Medicare Other | Admitting: Cardiology

## 2018-12-21 ENCOUNTER — Other Ambulatory Visit: Payer: Self-pay

## 2018-12-21 ENCOUNTER — Encounter: Payer: Self-pay | Admitting: Cardiology

## 2018-12-21 VITALS — BP 116/64 | HR 72 | Ht 59.0 in | Wt 143.0 lb

## 2018-12-21 DIAGNOSIS — J410 Simple chronic bronchitis: Secondary | ICD-10-CM

## 2018-12-21 DIAGNOSIS — I350 Nonrheumatic aortic (valve) stenosis: Secondary | ICD-10-CM

## 2018-12-21 DIAGNOSIS — I10 Essential (primary) hypertension: Secondary | ICD-10-CM | POA: Diagnosis not present

## 2018-12-21 DIAGNOSIS — I739 Peripheral vascular disease, unspecified: Secondary | ICD-10-CM

## 2018-12-21 DIAGNOSIS — I5032 Chronic diastolic (congestive) heart failure: Secondary | ICD-10-CM | POA: Diagnosis not present

## 2018-12-21 NOTE — Addendum Note (Signed)
Addended by: Ashok Norris on: 12/21/2018 02:41 PM   Modules accepted: Orders

## 2018-12-21 NOTE — Progress Notes (Signed)
Cardiology Office Note:    Date:  12/21/2018   ID:  Annette Huang, DOB 12/15/1927, MRN [id number]  PCP:  Angelina Sheriff, MD  Cardiologist:  Jenne Campus, MD    Referring MD: Angelina Sheriff, MD   Chief Complaint  Patient presents with  . Follow-up  Doing well  History of Present Illness:    Annette Huang is a 85 y.o. female with past medical history significant for remote coronary artery disease, history of CVA, hypertension hyperlipidemia aortic stenosis, peripheral vascular disease status post right carotid endarterectomy, comes today to my office for follow-up overall doing well she says she is able to walk around to do things much slower but still able to do activities of daily living.  Denies having any chest pain, tightness, pressure, burning in the chest.  Sadly she still continue to smoke but cut down significantly amount of cigarettes she smokes.  Now she smokes only half pack per day.  Past Medical History:  Diagnosis Date  . COPD (chronic obstructive pulmonary disease) ([place])   . Coronary artery disease   . CVA (cerebral vascular accident) ([place])   . Hyperlipidemia   . Hypertension   . Thoracic aortic atherosclerosis [facility])     Past Surgical History:  Procedure Laterality Date  . NECK SURGERY      Current Medications: Current Meds  Medication Sig  . amLODipine (NORVASC) 5 MG tablet Take 5 mg daily by mouth.  Marland Kitchen atorvastatin (LIPITOR) 40 MG tablet Please take 1 tablet, once a day  . clopidogrel (PLAVIX) 75 MG tablet Take 75 mg daily by mouth.  . famotidine (PEPCID) 20 MG tablet One after supper  . furosemide (LASIX) 40 MG tablet Take 40 mg daily by mouth.  Marland Kitchen ipratropium-albuterol (DUONEB) 0.5-2.5 (3) MG/3ML SOLN Take 3 mLs by nebulization every 4 (four) hours as needed.  Marland Kitchen levothyroxine (SYNTHROID, LEVOTHROID) 50 MCG tablet Take 1 tablet daily by mouth.  . metoprolol tartrate (LOPRESSOR) 25 MG tablet Take 12.5 mg by mouth 2 (two) times daily.  .  OXYGEN 2lpm as needed  . pantoprazole (PROTONIX) 40 MG tablet Take 1 tablet (40 mg total) by mouth daily. Take 30-60 min before first meal of the day  . Vitamin D, Ergocalciferol, (DRISDOL) 1.25 MG (50000 UT) CAPS capsule Take 50,000 Units by mouth every 7 (seven) days.     Allergies:   Codeine   Social History   Socioeconomic History  . Marital status: Married    Spouse name: Not on file  . Number of children: Not on file  . Years of education: Not on file  . Highest education level: Not on file  Occupational History  . Not on file  Social Needs  . Financial resource strain: Not on file  . Food insecurity    Worry: Not on file    Inability: Not on file  . Transportation needs    Medical: Not on file    Non-medical: Not on file  Tobacco Use  . Smoking status: Former Smoker    Packs/day: 1.00    Years: 35.00    Pack years: 35.00    Types: Cigarettes    Quit date: 07/22/2018    Years since quitting: 0.4  . Smokeless tobacco: Never Used  Substance and Sexual Activity  . Alcohol use: No    Frequency: Never  . Drug use: No  . Sexual activity: Not on file  Lifestyle  . Physical activity    Days per  week: Not on file    Minutes per session: Not on file  . Stress: Not on file  Relationships  . Social Herbalist on phone: Not on file    Gets together: Not on file    Attends religious service: Not on file    Active member of club or organization: Not on file    Attends meetings of clubs or organizations: Not on file    Relationship status: Not on file  Other Topics Concern  . Not on file  Social History Narrative  . Not on file     Family History: The patient's family history includes Heart disease in her father and mother; Hypertension in her mother. ROS:   Please see the history of present illness.    All 14 point review of systems negative except as described per history of present illness  EKGs/Labs/Other Studies Reviewed:      Recent Labs:  08/28/2018: BUN 17; Creatinine, Ser 1.25; NT-Pro BNP 463; Potassium 4.0; Sodium 143  Recent Lipid Panel No results found for: CHOL, TRIG, HDL, CHOLHDL, VLDL, LDLCALC, LDLDIRECT  Physical Exam:    VS:  BP 116/64   Pulse 72   Ht 4\' 11"  (1.499 m)   Wt 143 lb (64.9 kg)   SpO2 98%   BMI 28.88 kg/m     Wt Readings from Last 3 Encounters:  12/21/18 143 lb (64.9 kg)  09/01/18 153 lb (69.4 kg)  08/28/18 153 lb 6.4 oz (69.6 kg)     GEN:  Well nourished, well developed in no acute distress HEENT: Normal NECK: No JVD; No carotid bruits LYMPHATICS: No lymphadenopathy CARDIAC: RRR, systolic ejection murmur grade 2/6 best heard at the right upper portion of the sternum tones are distant, no rubs, no gallops RESPIRATORY:  Clear to auscultation without rales, wheezing or rhonchi, poor air entry bilaterally ABDOMEN: Soft, non-tender, non-distended MUSCULOSKELETAL:  No edema; No deformity  SKIN: Warm and dry LOWER EXTREMITIES: no swelling NEUROLOGIC:  Alert and oriented x 3 PSYCHIATRIC:  Normal affect   ASSESSMENT:    1. Peripheral vascular disease, unspecified ([place]) status post right carotic endarterectomy   2. Essential hypertension   3. Chronic diastolic congestive heart failure ([place])   4. Nonrheumatic aortic valve stenosis   5. Simple chronic bronchitis (HCC)    PLAN:    In order of problems listed above:  1. Peripheral vascular disease stable last cardiac ultrasound showed up to 59% stenosis.  Continue risk factors modifications 2. Essential hypertension blood pressure well controlled continue present management 3. Diastolic congestive heart failure compensated. 4. Nonrheumatic aortic valve stenosis we will schedule her to have echocardiogram to look at the valve again. 5. COPD noted obviously she was told again that she must quit smoking.   Medication Adjustments/Labs and Tests Ordered: Current medicines are reviewed at length with the patient today.  Concerns regarding  medicines are outlined above.  No orders of the defined types were placed in this encounter.  Medication changes: No orders of the defined types were placed in this encounter.   Signed, Park Liter, MD, [facility] 12/21/2018 2:29 PM    [place]

## 2018-12-21 NOTE — Patient Instructions (Signed)
Medication Instructions:  Your physician recommends that you continue on your current medications as directed. Please refer to the Current Medication list given to you today.  *If you need a refill on your cardiac medications before your next appointment, please call your pharmacy*  Lab Work: None.  If you have labs (blood work) drawn today and your tests are completely normal, you will receive your results only by: . MyChart Message (if you have MyChart) OR . A paper copy in the mail If you have any lab test that is abnormal or we need to change your treatment, we will call you to review the results.  Testing/Procedures: Your physician has requested that you have an echocardiogram. Echocardiography is a painless test that uses sound waves to create images of your heart. It provides your doctor with information about the size and shape of your heart and how well your heart's chambers and valves are working. This procedure takes approximately one hour. There are no restrictions for this procedure.    Follow-Up: At [facility], you and your health needs are our priority.  As part of our continuing mission to provide you with exceptional heart care, we have created designated Provider Care Teams.  These Care Teams include your primary Cardiologist (physician) and Advanced Practice Providers (APPs -  Physician Assistants and Nurse Practitioners) who all work together to provide you with the care you need, when you need it.  Your next appointment:   6 months  The format for your next appointment:   In Person  Provider:   Robert Krasowski, MD  Other Instructions   Echocardiogram An echocardiogram is a procedure that uses painless sound waves (ultrasound) to produce an image of the heart. Images from an echocardiogram can provide important information about:  Signs of coronary artery disease (CAD).  Aneurysm detection. An aneurysm is a weak or damaged part of an artery wall that  bulges out from the normal force of blood pumping through the body.  Heart size and shape. Changes in the size or shape of the heart can be associated with certain conditions, including heart failure, aneurysm, and CAD.  Heart muscle function.  Heart valve function.  Signs of a past heart attack.  Fluid buildup around the heart.  Thickening of the heart muscle.  A tumor or infectious growth around the heart valves. Tell a health care provider about:  Any allergies you have.  All medicines you are taking, including vitamins, herbs, eye drops, creams, and over-the-counter medicines.  Any blood disorders you have.  Any surgeries you have had.  Any medical conditions you have.  Whether you are pregnant or may be pregnant. What are the risks? Generally, this is a safe procedure. However, problems may occur, including:  Allergic reaction to dye (contrast) that may be used during the procedure. What happens before the procedure? No specific preparation is needed. You may eat and drink normally. What happens during the procedure?   An IV tube may be inserted into one of your veins.  You may receive contrast through this tube. A contrast is an injection that improves the quality of the pictures from your heart.  A gel will be applied to your chest.  A wand-like tool (transducer) will be moved over your chest. The gel will help to transmit the sound waves from the transducer.  The sound waves will harmlessly bounce off of your heart to allow the heart images to be captured in real-time motion. The images will be recorded   on a computer. The procedure may vary among health care providers and hospitals. What happens after the procedure?  You may return to your normal, everyday life, including diet, activities, and medicines, unless your health care provider tells you not to do that. Summary  An echocardiogram is a procedure that uses painless sound waves (ultrasound) to produce  an image of the heart.  Images from an echocardiogram can provide important information about the size and shape of your heart, heart muscle function, heart valve function, and fluid buildup around your heart.  You do not need to do anything to prepare before this procedure. You may eat and drink normally.  After the echocardiogram is completed, you may return to your normal, everyday life, unless your health care provider tells you not to do that. This information is not intended to replace advice given to you by your health care provider. Make sure you discuss any questions you have with your health care provider. Document Released: 01/19/2000 Document Revised: 05/14/2018 Document Reviewed: 02/24/2016 Elsevier Patient Education  2020 Elsevier Inc.   

## 2018-12-28 ENCOUNTER — Other Ambulatory Visit: Payer: Self-pay

## 2019-02-15 ENCOUNTER — Ambulatory Visit (INDEPENDENT_AMBULATORY_CARE_PROVIDER_SITE_OTHER): Payer: Medicare HMO

## 2019-02-15 ENCOUNTER — Other Ambulatory Visit: Payer: Self-pay

## 2019-02-15 DIAGNOSIS — I5032 Chronic diastolic (congestive) heart failure: Secondary | ICD-10-CM

## 2019-02-15 NOTE — Progress Notes (Unsigned)
Complete echocardiogram has been performed.  Jimmy Noam Franzen RDCS, RVT 

## 2019-02-17 ENCOUNTER — Telehealth: Payer: Self-pay | Admitting: Emergency Medicine

## 2019-02-17 NOTE — Telephone Encounter (Signed)
Left message for patient to return call regarding results  

## 2019-02-17 NOTE — Telephone Encounter (Signed)
Patient called back, pleaes call her

## 2019-03-02 DIAGNOSIS — J449 Chronic obstructive pulmonary disease, unspecified: Secondary | ICD-10-CM | POA: Diagnosis not present

## 2019-04-02 DIAGNOSIS — J449 Chronic obstructive pulmonary disease, unspecified: Secondary | ICD-10-CM | POA: Diagnosis not present

## 2019-04-20 DIAGNOSIS — Z1231 Encounter for screening mammogram for malignant neoplasm of breast: Secondary | ICD-10-CM | POA: Diagnosis not present

## 2019-04-30 DIAGNOSIS — J449 Chronic obstructive pulmonary disease, unspecified: Secondary | ICD-10-CM | POA: Diagnosis not present

## 2019-05-28 ENCOUNTER — Other Ambulatory Visit: Payer: Self-pay | Admitting: Internal Medicine

## 2019-05-28 DIAGNOSIS — R0609 Other forms of dyspnea: Secondary | ICD-10-CM

## 2019-05-31 DIAGNOSIS — J449 Chronic obstructive pulmonary disease, unspecified: Secondary | ICD-10-CM | POA: Diagnosis not present

## 2019-06-21 DIAGNOSIS — E7800 Pure hypercholesterolemia, unspecified: Secondary | ICD-10-CM | POA: Diagnosis not present

## 2019-06-21 DIAGNOSIS — Z79899 Other long term (current) drug therapy: Secondary | ICD-10-CM | POA: Diagnosis not present

## 2019-06-21 DIAGNOSIS — I10 Essential (primary) hypertension: Secondary | ICD-10-CM | POA: Diagnosis not present

## 2019-06-21 DIAGNOSIS — Z1331 Encounter for screening for depression: Secondary | ICD-10-CM | POA: Diagnosis not present

## 2019-06-21 DIAGNOSIS — E039 Hypothyroidism, unspecified: Secondary | ICD-10-CM | POA: Diagnosis not present

## 2019-06-21 DIAGNOSIS — Z0000 Encounter for general adult medical examination without abnormal findings: Secondary | ICD-10-CM | POA: Diagnosis not present

## 2019-06-21 DIAGNOSIS — Z6828 Body mass index (BMI) 28.0-28.9, adult: Secondary | ICD-10-CM | POA: Diagnosis not present

## 2019-06-30 DIAGNOSIS — J449 Chronic obstructive pulmonary disease, unspecified: Secondary | ICD-10-CM | POA: Diagnosis not present

## 2019-07-30 ENCOUNTER — Other Ambulatory Visit: Payer: Self-pay

## 2019-07-31 DIAGNOSIS — J449 Chronic obstructive pulmonary disease, unspecified: Secondary | ICD-10-CM | POA: Diagnosis not present

## 2019-08-03 ENCOUNTER — Encounter: Payer: Self-pay | Admitting: Cardiology

## 2019-08-03 ENCOUNTER — Other Ambulatory Visit: Payer: Self-pay

## 2019-08-03 ENCOUNTER — Ambulatory Visit: Payer: Medicare HMO | Admitting: Cardiology

## 2019-08-03 VITALS — BP 112/58 | HR 79 | Ht 59.0 in | Wt 139.0 lb

## 2019-08-03 DIAGNOSIS — R0600 Dyspnea, unspecified: Secondary | ICD-10-CM | POA: Diagnosis not present

## 2019-08-03 DIAGNOSIS — I739 Peripheral vascular disease, unspecified: Secondary | ICD-10-CM | POA: Diagnosis not present

## 2019-08-03 DIAGNOSIS — F17200 Nicotine dependence, unspecified, uncomplicated: Secondary | ICD-10-CM | POA: Diagnosis not present

## 2019-08-03 DIAGNOSIS — E782 Mixed hyperlipidemia: Secondary | ICD-10-CM

## 2019-08-03 DIAGNOSIS — R0609 Other forms of dyspnea: Secondary | ICD-10-CM

## 2019-08-03 DIAGNOSIS — I350 Nonrheumatic aortic (valve) stenosis: Secondary | ICD-10-CM

## 2019-08-03 DIAGNOSIS — I6990 Unspecified sequelae of unspecified cerebrovascular disease: Secondary | ICD-10-CM

## 2019-08-03 MED ORDER — ATORVASTATIN CALCIUM 40 MG PO TABS: ORAL_TABLET | ORAL | 1 refills | Status: DC

## 2019-08-03 NOTE — Patient Instructions (Signed)
Medication Instructions:  Your physician has recommended you make the following change in your medication:   INCREASE: Lipitor 40 mg daily   *If you need a refill on your cardiac medications before your next appointment, please call your pharmacy*   Lab Work: Your physician recommends that you return for lab work in 6 weeks fasting: lipids  If you have labs (blood work) drawn today and your tests are completely normal, you will receive your results only by: Marland Kitchen MyChart Message (if you have MyChart) OR . A paper copy in the mail If you have any lab test that is abnormal or we need to change your treatment, we will call you to review the results.   Testing/Procedures: Your physician has requested that you have a carotid duplex. This test is an ultrasound of the carotid arteries in your neck. It looks at blood flow through these arteries that supply the brain with blood. Allow one hour for this exam. There are no restrictions or special instructions.  Your physician has requested that you have an echocardiogram. Echocardiography is a painless test that uses sound waves to create images of your heart. It provides your doctor with information about the size and shape of your heart and how well your heart's chambers and valves are working. This procedure takes approximately one hour. There are no restrictions for this procedure.     Follow-Up: At [facility], you and your health needs are our priority.  As part of our continuing mission to provide you with exceptional heart care, we have created designated Provider Care Teams.  These Care Teams include your primary Cardiologist (physician) and Advanced Practice Providers (APPs -  Physician Assistants and Nurse Practitioners) who all work together to provide you with the care you need, when you need it.  We recommend signing up for the patient portal called "MyChart".  Sign up information is provided on this After Visit Summary.  MyChart is used  to connect with patients for Virtual Visits (Telemedicine).  Patients are able to view lab/test results, encounter notes, upcoming appointments, etc.  Non-urgent messages can be sent to your provider as well.   To learn more about what you can do with MyChart, go to [email or web address].    Your next appointment:   4 month(s)  The format for your next appointment:   In Person  Provider:   Jenne Campus, MD   Other Instructions   Echocardiogram An echocardiogram is a procedure that uses painless sound waves (ultrasound) to produce an image of the heart. Images from an echocardiogram can provide important information about:  Signs of coronary artery disease (CAD).  Aneurysm detection. An aneurysm is a weak or damaged part of an artery wall that bulges out from the normal force of blood pumping through the body.  Heart size and shape. Changes in the size or shape of the heart can be associated with certain conditions, including heart failure, aneurysm, and CAD.  Heart muscle function.  Heart valve function.  Signs of a past heart attack.  Fluid buildup around the heart.  Thickening of the heart muscle.  A tumor or infectious growth around the heart valves. Tell a health care provider about:  Any allergies you have.  All medicines you are taking, including vitamins, herbs, eye drops, creams, and over-the-counter medicines.  Any blood disorders you have.  Any surgeries you have had.  Any medical conditions you have.  Whether you are pregnant or may be pregnant. What are the risks? Generally,  this is a safe procedure. However, problems may occur, including:  Allergic reaction to dye (contrast) that may be used during the procedure. What happens before the procedure? No specific preparation is needed. You may eat and drink normally. What happens during the procedure?   An IV tube may be inserted into one of your veins.  You may receive contrast through this  tube. A contrast is an injection that improves the quality of the pictures from your heart.  A gel will be applied to your chest.  A wand-like tool (transducer) will be moved over your chest. The gel will help to transmit the sound waves from the transducer.  The sound waves will harmlessly bounce off of your heart to allow the heart images to be captured in real-time motion. The images will be recorded on a computer. The procedure may vary among health care providers and hospitals. What happens after the procedure?  You may return to your normal, everyday life, including diet, activities, and medicines, unless your health care provider tells you not to do that. Summary  An echocardiogram is a procedure that uses painless sound waves (ultrasound) to produce an image of the heart.  Images from an echocardiogram can provide important information about the size and shape of your heart, heart muscle function, heart valve function, and fluid buildup around your heart.  You do not need to do anything to prepare before this procedure. You may eat and drink normally.  After the echocardiogram is completed, you may return to your normal, everyday life, unless your health care provider tells you not to do that. This information is not intended to replace advice given to you by your health care provider. Make sure you discuss any questions you have with your health care provider. Document Revised: 05/14/2018 Document Reviewed: 02/24/2016 Elsevier Patient Education  2020 Elsevier Inc.   

## 2019-08-03 NOTE — Progress Notes (Signed)
Cardiology Office Note:    Date:  08/03/2019   ID:  Annette Huang, DOB 06-Aug-1927, MRN [id number]  PCP:  Angelina Sheriff, MD  Cardiologist:  Jenne Campus, MD    Referring MD: Angelina Sheriff, MD   No chief complaint on file. Doing fine  History of Present Illness:    Annette Huang is a 86 y.o. female with past medical history significant for aortic stenosis, last assessment in January showing moderate aortic stenosis, preserved ejection fraction, carotic arterial disease, chronic smoking, hypertension and dyslipidemia.  She comes today 2 months of follow-up she complained of having shortness of breath but this is a chronic complaint which she only did fine but she complained of having some swelling of lower extremities at evening time.  Sadly she still continues to smoke.  She still like to panel and does a lot of things at home but get tired more easily and again shortness of breath and cough is leading complaint.  Past Medical History:  Diagnosis Date  . COPD (chronic obstructive pulmonary disease) ([place])   . Coronary artery disease   . CVA (cerebral vascular accident) ([place])   . Hyperlipidemia   . Hypertension   . Thoracic aortic atherosclerosis [facility])     Past Surgical History:  Procedure Laterality Date  . NECK SURGERY      Current Medications: Current Meds  Medication Sig  . amLODipine (NORVASC) 5 MG tablet Take 5 mg daily by mouth.  Marland Kitchen atorvastatin (LIPITOR) 20 MG tablet Take 1 tablet by mouth daily.  Marland Kitchen atorvastatin (LIPITOR) 40 MG tablet Please take 1 tablet, once a day  . chlorhexidine (PERIDEX) 0.12 % solution Use as directed  . clopidogrel (PLAVIX) 75 MG tablet Take 75 mg daily by mouth.  . famotidine (PEPCID) 20 MG tablet One after supper  . furosemide (LASIX) 40 MG tablet Take 40 mg daily by mouth.  Marland Kitchen ipratropium-albuterol (DUONEB) 0.5-2.5 (3) MG/3ML SOLN Take 3 mLs by nebulization every 4 (four) hours as needed.  Marland Kitchen levothyroxine (SYNTHROID,  LEVOTHROID) 50 MCG tablet Take 1 tablet daily by mouth.  . metoprolol tartrate (LOPRESSOR) 25 MG tablet Take 12.5 mg by mouth 2 (two) times daily.  . OXYGEN 2lpm as needed  . pantoprazole (PROTONIX) 40 MG tablet TAKE 1 TABLET BY MOUTH IN THE MORNING, TAKE 30-60 MINUTES BEFORE FIRST MEAL OF THE DAY  . Vitamin D, Ergocalciferol, (DRISDOL) 1.25 MG (50000 UT) CAPS capsule Take 50,000 Units by mouth every 7 (seven) days.     Allergies:   Codeine   Social History   Socioeconomic History  . Marital status: Married    Spouse name: Not on file  . Number of children: Not on file  . Years of education: Not on file  . Highest education level: Not on file  Occupational History  . Not on file  Tobacco Use  . Smoking status: Former Smoker    Packs/day: 1.00    Years: 35.00    Pack years: 35.00    Types: Cigarettes    Quit date: 07/22/2018    Years since quitting: 1.0  . Smokeless tobacco: Never Used  Vaping Use  . Vaping Use: Former  Substance and Sexual Activity  . Alcohol use: No  . Drug use: No  . Sexual activity: Not on file  Other Topics Concern  . Not on file  Social History Narrative  . Not on file   Social Determinants of Health   Financial Resource Strain:   .  Difficulty of Paying Living Expenses:   Food Insecurity:   . Worried About Charity fundraiser in the Last Year:   . Arboriculturist in the Last Year:   Transportation Needs:   . Film/video editor (Medical):   Marland Kitchen Lack of Transportation (Non-Medical):   Physical Activity:   . Days of Exercise per Week:   . Minutes of Exercise per Session:   Stress:   . Feeling of Stress :   Social Connections:   . Frequency of Communication with Friends and Family:   . Frequency of Social Gatherings with Friends and Family:   . Attends Religious Services:   . Active Member of Clubs or Organizations:   . Attends Archivist Meetings:   Marland Kitchen Marital Status:      Family History: The patient's family history  includes Heart disease in her father and mother; Hypertension in her mother. ROS:   Please see the history of present illness.    All 14 point review of systems negative except as described per history of present illness  EKGs/Labs/Other Studies Reviewed:    EKG today showed normal sinus rhythm, pulmonary disease pattern, left anterior fascicular block, Q waves V1 V2 rising suspicion for anterior septal wall MI.  Recent Labs: 08/28/2018: BUN 17; Creatinine, Ser 1.25; NT-Pro BNP 463; Potassium 4.0; Sodium 143  Recent Lipid Panel No results found for: CHOL, TRIG, HDL, CHOLHDL, VLDL, LDLCALC, LDLDIRECT  Physical Exam:    VS:  BP (!) 112/58 (BP Location: Left Arm, Patient Position: Sitting, Cuff Size: Normal)   Pulse 79   Ht 4\' 11"  (1.499 m)   Wt 139 lb (63 kg)   SpO2 95%   BMI 28.07 kg/m     Wt Readings from Last 3 Encounters:  08/03/19 139 lb (63 kg)  12/21/18 143 lb (64.9 kg)  09/01/18 153 lb (69.4 kg)     GEN:  Well nourished, well developed in no acute distress HEENT: Normal NECK: No JVD; No carotid bruits LYMPHATICS: No lymphadenopathy CARDIAC: RRR, systolic ejection murmur late peaking.  S2 is still present grade 2 through 3/6 best heard right upper portion of the sternum, no rubs, no gallops RESPIRATORY:  Clear to auscultation without rales, wheezing or rhonchi  ABDOMEN: Soft, non-tender, non-distended MUSCULOSKELETAL:  No edema; No deformity  SKIN: Warm and dry LOWER EXTREMITIES: Minimal pitting edema NEUROLOGIC:  Alert and oriented x 3 PSYCHIATRIC:  Normal affect   ASSESSMENT:    1. Nonrheumatic aortic valve stenosis   2. DOE (dyspnea on exertion)   3. Late effects of CVA (cerebrovascular accident)   4. Peripheral vascular disease, unspecified ([place]) status post right carotic endarterectomy   5. Smoking   6. Mixed hyperlipidemia    PLAN:    In order of problems listed above:  1. Nonrheumatic aortic valve stenosis I will schedule her to have an  echocardiogram this is because of swelling of lower extremities.  Her aortic stenosis need to be reassessed. 2. Dyspnea on exertion most likely multifactorial.  Clearly COPD/smoking play significant role here, however ultimately she does not have significant worsening of her aortic stenosis. 3. Peripheral vascular disease in form of carotic arterial disease.  I will schedule him for left carotid ultrasound. 4. Smoking obviously a problem spent at least 10 minutes talking about this she told me that many times 5. She quit smoking however, never was able to maintain without it.  Hopefully this time she will succeed.  She made an interesting  comments today she said since her legs started swelling and I explained to her that that could be sequela of smoking she is thinking seriously about quitting.  Hopefully she will succeed. 6. Dyslipidemia: I did review her K PN which showed her LDL of 83 HDL of 58 this is from Jun 21, 2019.  We call pharmacy to look confirm the dose of her Lipitor she takes only 20 mg swelling present to 42 high intensity level.  Fasting lipid profile will be done over the next 6 weeks   Medication Adjustments/Labs and Tests Ordered: Current medicines are reviewed at length with the patient today.  Concerns regarding medicines are outlined above.  No orders of the defined types were placed in this encounter.  Medication changes: No orders of the defined types were placed in this encounter.   Signed, Park Liter, MD, [facility] 08/03/2019 11:24 AM    [place]

## 2019-08-19 ENCOUNTER — Ambulatory Visit (INDEPENDENT_AMBULATORY_CARE_PROVIDER_SITE_OTHER): Payer: Medicare HMO

## 2019-08-19 ENCOUNTER — Other Ambulatory Visit: Payer: Self-pay

## 2019-08-19 DIAGNOSIS — R0600 Dyspnea, unspecified: Secondary | ICD-10-CM | POA: Diagnosis not present

## 2019-08-19 DIAGNOSIS — Z9862 Peripheral vascular angioplasty status: Secondary | ICD-10-CM | POA: Diagnosis not present

## 2019-08-19 DIAGNOSIS — E782 Mixed hyperlipidemia: Secondary | ICD-10-CM

## 2019-08-19 DIAGNOSIS — R0609 Other forms of dyspnea: Secondary | ICD-10-CM

## 2019-08-19 DIAGNOSIS — I350 Nonrheumatic aortic (valve) stenosis: Secondary | ICD-10-CM | POA: Diagnosis not present

## 2019-08-19 LAB — ECHOCARDIOGRAM COMPLETE
AR max vel: 0.67 cm2
AV Area VTI: 0.66 cm2
AV Area mean vel: 0.71 cm2
AV Mean grad: 21.5 mmHg
AV Peak grad: 37.9 mmHg
Ao pk vel: 3.08 m/s
Area-P 1/2: 2.32 cm2
S' Lateral: 2.5 cm

## 2019-08-19 NOTE — Progress Notes (Signed)
Carotid duplex exam performed  Jimmy Nishika Parkhurst RDCS, RVT 

## 2019-08-19 NOTE — Progress Notes (Signed)
Complete echocardiogram performed.  Jimmy Yvonda Fouty RDCS, RVT  

## 2019-08-23 ENCOUNTER — Telehealth: Payer: Self-pay

## 2019-08-23 NOTE — Telephone Encounter (Signed)
Spoke with patient regarding results and recommendation.  Patient verbalizes understanding and is agreeable to plan of care. Advised patient to call back with any issues or concerns.  

## 2019-08-23 NOTE — Telephone Encounter (Signed)
-----   Message from Park Liter, MD sent at 08/23/2019  8:10 AM EDT ----- Carotic artery up to 40% both sides. This is only for medical therapy.

## 2019-08-30 DIAGNOSIS — J449 Chronic obstructive pulmonary disease, unspecified: Secondary | ICD-10-CM | POA: Diagnosis not present

## 2019-09-13 DIAGNOSIS — M7541 Impingement syndrome of right shoulder: Secondary | ICD-10-CM | POA: Diagnosis not present

## 2019-09-13 DIAGNOSIS — M25511 Pain in right shoulder: Secondary | ICD-10-CM | POA: Diagnosis not present

## 2019-09-14 DIAGNOSIS — E782 Mixed hyperlipidemia: Secondary | ICD-10-CM | POA: Diagnosis not present

## 2019-09-14 LAB — LIPID PANEL
Chol/HDL Ratio: 2.4 ratio (ref 0.0–4.4)
Cholesterol, Total: 132 mg/dL (ref 100–199)
HDL: 54 mg/dL (ref >39)
LDL Chol Calc (NIH): 65 mg/dL (ref 0–99)
Triglycerides: 59 mg/dL (ref 0–149)
VLDL Cholesterol Cal: 13 mg/dL (ref 5–40)

## 2019-09-30 DIAGNOSIS — H524 Presbyopia: Secondary | ICD-10-CM | POA: Diagnosis not present

## 2019-09-30 DIAGNOSIS — H26491 Other secondary cataract, right eye: Secondary | ICD-10-CM | POA: Diagnosis not present

## 2019-09-30 DIAGNOSIS — H353131 Nonexudative age-related macular degeneration, bilateral, early dry stage: Secondary | ICD-10-CM | POA: Diagnosis not present

## 2019-09-30 DIAGNOSIS — H5203 Hypermetropia, bilateral: Secondary | ICD-10-CM | POA: Diagnosis not present

## 2019-09-30 DIAGNOSIS — Z9842 Cataract extraction status, left eye: Secondary | ICD-10-CM | POA: Diagnosis not present

## 2019-09-30 DIAGNOSIS — J449 Chronic obstructive pulmonary disease, unspecified: Secondary | ICD-10-CM | POA: Diagnosis not present

## 2019-09-30 DIAGNOSIS — H35341 Macular cyst, hole, or pseudohole, right eye: Secondary | ICD-10-CM | POA: Diagnosis not present

## 2019-09-30 DIAGNOSIS — Z961 Presence of intraocular lens: Secondary | ICD-10-CM | POA: Diagnosis not present

## 2019-09-30 DIAGNOSIS — H52223 Regular astigmatism, bilateral: Secondary | ICD-10-CM | POA: Diagnosis not present

## 2019-09-30 DIAGNOSIS — H35373 Puckering of macula, bilateral: Secondary | ICD-10-CM | POA: Diagnosis not present

## 2019-10-07 DIAGNOSIS — Z0100 Encounter for examination of eyes and vision without abnormal findings: Secondary | ICD-10-CM | POA: Diagnosis not present

## 2019-10-29 DIAGNOSIS — R0989 Other specified symptoms and signs involving the circulatory and respiratory systems: Secondary | ICD-10-CM | POA: Diagnosis not present

## 2019-10-29 DIAGNOSIS — R0902 Hypoxemia: Secondary | ICD-10-CM | POA: Diagnosis not present

## 2019-10-29 DIAGNOSIS — I10 Essential (primary) hypertension: Secondary | ICD-10-CM | POA: Diagnosis not present

## 2019-10-30 DIAGNOSIS — J90 Pleural effusion, not elsewhere classified: Secondary | ICD-10-CM | POA: Diagnosis not present

## 2019-10-30 DIAGNOSIS — E876 Hypokalemia: Secondary | ICD-10-CM | POA: Diagnosis not present

## 2019-10-30 DIAGNOSIS — J984 Other disorders of lung: Secondary | ICD-10-CM | POA: Diagnosis not present

## 2019-10-30 DIAGNOSIS — R002 Palpitations: Secondary | ICD-10-CM | POA: Diagnosis not present

## 2019-10-30 DIAGNOSIS — F17200 Nicotine dependence, unspecified, uncomplicated: Secondary | ICD-10-CM | POA: Diagnosis not present

## 2019-10-30 DIAGNOSIS — R011 Cardiac murmur, unspecified: Secondary | ICD-10-CM | POA: Diagnosis not present

## 2019-10-30 DIAGNOSIS — I452 Bifascicular block: Secondary | ICD-10-CM | POA: Diagnosis not present

## 2019-10-31 DIAGNOSIS — J449 Chronic obstructive pulmonary disease, unspecified: Secondary | ICD-10-CM | POA: Diagnosis not present

## 2019-10-31 DIAGNOSIS — I452 Bifascicular block: Secondary | ICD-10-CM | POA: Diagnosis not present

## 2019-10-31 DIAGNOSIS — J984 Other disorders of lung: Secondary | ICD-10-CM | POA: Diagnosis not present

## 2019-11-01 ENCOUNTER — Telehealth: Payer: Self-pay | Admitting: Cardiology

## 2019-11-01 NOTE — Telephone Encounter (Signed)
New message:     Patient was seen in the ER and need a sooner apt.

## 2019-11-01 NOTE — Telephone Encounter (Signed)
Called and spoke to patient daughter. Scheduled patient for tomorrow with Dr. Harriet Masson.

## 2019-11-01 NOTE — Telephone Encounter (Signed)
That will be fine. 

## 2019-11-01 NOTE — Telephone Encounter (Signed)
Spoke to patient's daughter per dpr. Dr. Agustin Cree has no openings this week. Patient was seen at [facility] in the emergency department and needs a follow up. Daughter asking to see Dr.  Harriet Masson since there are no openings with Dr. Agustin Cree. She reports Dr. Harriet Masson has cared for her father before. Will consult with Dr. Harriet Masson.

## 2019-11-02 ENCOUNTER — Ambulatory Visit (INDEPENDENT_AMBULATORY_CARE_PROVIDER_SITE_OTHER): Payer: Medicare HMO

## 2019-11-02 ENCOUNTER — Other Ambulatory Visit: Payer: Self-pay

## 2019-11-02 ENCOUNTER — Ambulatory Visit: Payer: Medicare HMO | Admitting: Cardiology

## 2019-11-02 VITALS — BP 148/64 | HR 84 | Ht 59.0 in | Wt 136.8 lb

## 2019-11-02 DIAGNOSIS — I350 Nonrheumatic aortic (valve) stenosis: Secondary | ICD-10-CM

## 2019-11-02 DIAGNOSIS — R002 Palpitations: Secondary | ICD-10-CM

## 2019-11-02 DIAGNOSIS — R0602 Shortness of breath: Secondary | ICD-10-CM

## 2019-11-02 DIAGNOSIS — I2510 Atherosclerotic heart disease of native coronary artery without angina pectoris: Secondary | ICD-10-CM | POA: Insufficient documentation

## 2019-11-02 DIAGNOSIS — I10 Essential (primary) hypertension: Secondary | ICD-10-CM | POA: Diagnosis not present

## 2019-11-02 DIAGNOSIS — E785 Hyperlipidemia, unspecified: Secondary | ICD-10-CM | POA: Insufficient documentation

## 2019-11-02 DIAGNOSIS — I700 Atherosclerosis of aorta: Secondary | ICD-10-CM | POA: Insufficient documentation

## 2019-11-02 DIAGNOSIS — I639 Cerebral infarction, unspecified: Secondary | ICD-10-CM | POA: Insufficient documentation

## 2019-11-02 NOTE — Patient Instructions (Signed)
Medication Instructions:  No medication changes. *If you need a refill on your cardiac medications before your next appointment, please call your pharmacy*   Lab Work: Your physician recommends that you have labs done in the office today. Your test included  basic metabolic panel and BNP.  If you have labs (blood work) drawn today and your tests are completely normal, you will receive your results only by: Marland Kitchen MyChart Message (if you have MyChart) OR . A paper copy in the mail If you have any lab test that is abnormal or we need to change your treatment, we will call you to review the results.   Testing/Procedures:  WHY IS MY DOCTOR PRESCRIBING ZIO? The Zio system is proven and trusted by physicians to detect and diagnose irregular heart rhythms -- and has been prescribed to hundreds of thousands of patients.  The FDA has cleared the Zio system to monitor for many different kinds of irregular heart rhythms. In a study, physicians were able to reach a diagnosis 90% of the time with the Zio system1.  You can wear the Zio monitor -- a small, discreet, comfortable patch -- during your normal day-to-day activity, including while you sleep, shower, and exercise, while it records every single heartbeat for analysis.  1Barrett, P., et al. Comparison of 24 Hour Holter Monitoring Versus 14 Day Novel Adhesive Patch Electrocardiographic Monitoring. [place], 2014.  ZIO VS. HOLTER MONITORING The Zio monitor can be comfortably worn for up to 14 days. Holter monitors can be worn for 24 to 48 hours, limiting the time to record any irregular heart rhythms you may have. Zio is able to capture data for the 51% of patients who have their first symptom-triggered arrhythmia after 48 hours.1  LIVE WITHOUT RESTRICTIONS The Zio ambulatory cardiac monitor is a small, unobtrusive, and water-resistant patch--you might even forget you're wearing it. The Zio monitor records and stores every beat of  your heart, whether you're sleeping, working out, or showering. Wear the monitor for 7 days. Remove 11/09/19.   Follow-Up: At [facility], you and your health needs are our priority.  As part of our continuing mission to provide you with exceptional heart care, we have created designated Provider Care Teams.  These Care Teams include your primary Cardiologist (physician) and Advanced Practice Providers (APPs -  Physician Assistants and Nurse Practitioners) who all work together to provide you with the care you need, when you need it.  We recommend signing up for the patient portal called "MyChart".  Sign up information is provided on this After Visit Summary.  MyChart is used to connect with patients for Virtual Visits (Telemedicine).  Patients are able to view lab/test results, encounter notes, upcoming appointments, etc.  Non-urgent messages can be sent to your provider as well.   To learn more about what you can do with MyChart, go to [email or web address].    Your next appointment:   3 month(s)  The format for your next appointment:   In Person  Provider:   Jenne Campus, MD   Other Instructions NA

## 2019-11-02 NOTE — Progress Notes (Signed)
Cardiology Office Note:    Date:  11/02/2019   ID:  Annette Huang, DOB 23-Apr-1927, MRN [id number]  PCP:  Angelina Sheriff, MD  Cardiologist:  No primary care provider on file.  Electrophysiologist:  None   Referring MD: Angelina Sheriff, MD   Follow up visit for leg swelling   History of Present Illness:    Annette Huang is a 86 y.o. female with a hx of moderate to severe low-flow low gradient aortic stenosis, coronary artery disease, chronic smoking, hypertension, hyperlipidemia presents today for follow-up visit. The patient is here with her daughter. The patient usually sees Dr. Agustin Cree and was last seen by him on 08/03/2019.  The patient was recently seen at the [facility] emergency department on October 30, 2019.  The patient was taken to the emergency department due to the fact that she had experienced intermittent palpitations before.  She reported heart rates in the 140s.  She denies any syncope episodes at that time or chest pain.  She does have some shortness of breath on exertion and reports some fatigue no other complaints at this time.  Past Medical History:  Diagnosis Date  . Aortic stenosis 11/09/2014   Overview:  Mild in 2015 Moderate to severe in the October 2018  . Chronic respiratory failure with hypoxia (HCC) 08/06/2018   02 hs x 2lpm as of 08/05/2018 and prn daytime to keep sats > 90%  - 09/01/2018 no desats walking / limited by back   . COPD (chronic obstructive pulmonary disease) ([place])   . Coronary artery disease   . CVA (cerebral vascular accident) ([place])   . Diastolic congestive heart failure ([place]) 07/31/2018  . DOE (dyspnea on exertion) 12/11/2016   Quit smoking 07/22/18 with emphysema on ct from 06/12/2018 - PFT 02/17/17    FEV1 1.35 [118%], ratio 0.82 with 14% resp to saba and TLC 93%, DLCO 73% - Echo 01/20/2018 c/w mod AS s PH - pt stopped vasotec and anoro on her own prior to pulmonary ov 08/05/2018 > rec leave off and rx for gerd x 4 weeks >  improved as of 09/01/2018  - 09/01/2018   Walked RA x one lap =  approx 250 ft avg pace >> stopped due to  . Essential hypertension 08/18/2013   rec Off acei permanently as of 08/05/2018 due to pseudowheeze   . Greater trochanteric bursitis of right hip 11/13/2017  . Hyperlipemia 08/18/2013  . Hyperlipidemia   . Hypertension   . Late effects of CVA (cerebrovascular accident) 11/09/2014  . Lumbar degenerative disc disease 06/18/2013  . Lumbar spondylosis 06/18/2013  . Peripheral vascular disease, unspecified ([place]) status post right carotic endarterectomy 08/28/2018  . Sacroiliac inflammation ([place]) 11/13/2017  . Sacroiliitis ([place]) 11/13/2017  . Smoking 05/23/2015  . Spinal stenosis of lumbar region with neurogenic claudication 08/18/2013  . Stroke ([place]) 09/08/2013  . Thoracic aortic atherosclerosis [facility])     Past Surgical History:  Procedure Laterality Date  . NECK SURGERY      Current Medications: Current Meds  Medication Sig  . amLODipine (NORVASC) 5 MG tablet Take 5 mg daily by mouth.  Marland Kitchen atorvastatin (LIPITOR) 40 MG tablet Please take 1 tablet, once a day  . chlorhexidine (PERIDEX) 0.12 % solution Use as directed  . famotidine (PEPCID) 20 MG tablet One after supper  . furosemide (LASIX) 40 MG tablet Take 40 mg daily by mouth.  Marland Kitchen ipratropium-albuterol (DUONEB) 0.5-2.5 (3) MG/3ML SOLN Take 3 mLs by nebulization  every 4 (four) hours as needed.  Marland Kitchen levothyroxine (SYNTHROID, LEVOTHROID) 50 MCG tablet Take 1 tablet daily by mouth.  . metoprolol tartrate (LOPRESSOR) 25 MG tablet Take 12.5 mg by mouth 2 (two) times daily.  . OXYGEN 2lpm as needed  . pantoprazole (PROTONIX) 40 MG tablet TAKE 1 TABLET BY MOUTH IN THE MORNING, TAKE 30-60 MINUTES BEFORE FIRST MEAL OF THE DAY  . potassium chloride (KLOR-CON) 10 MEQ tablet Take 10 mEq by mouth 2 (two) times daily.  . Vitamin D, Ergocalciferol, (DRISDOL) 1.25 MG (50000 UT) CAPS capsule Take 50,000 Units by mouth every 7 (seven) days.     Allergies:    Codeine   Social History   Socioeconomic History  . Marital status: Married    Spouse name: Not on file  . Number of children: Not on file  . Years of education: Not on file  . Highest education level: Not on file  Occupational History  . Not on file  Tobacco Use  . Smoking status: Former Smoker    Packs/day: 1.00    Years: 35.00    Pack years: 35.00    Types: Cigarettes    Quit date: 07/22/2018    Years since quitting: 1.2  . Smokeless tobacco: Never Used  Vaping Use  . Vaping Use: Former  Substance and Sexual Activity  . Alcohol use: No  . Drug use: No  . Sexual activity: Not on file  Other Topics Concern  . Not on file  Social History Narrative  . Not on file   Social Determinants of Health   Financial Resource Strain:   . Difficulty of Paying Living Expenses: Not on file  Food Insecurity:   . Worried About Charity fundraiser in the Last Year: Not on file  . Ran Out of Food in the Last Year: Not on file  Transportation Needs:   . Lack of Transportation (Medical): Not on file  . Lack of Transportation (Non-Medical): Not on file  Physical Activity:   . Days of Exercise per Week: Not on file  . Minutes of Exercise per Session: Not on file  Stress:   . Feeling of Stress : Not on file  Social Connections:   . Frequency of Communication with Friends and Family: Not on file  . Frequency of Social Gatherings with Friends and Family: Not on file  . Attends Religious Services: Not on file  . Active Member of Clubs or Organizations: Not on file  . Attends Archivist Meetings: Not on file  . Marital Status: Not on file     Family History: The patient's family history includes Heart disease in her father and mother; Hypertension in her mother.  ROS:   Review of Systems  Constitution: Negative for decreased appetite, fever and weight gain.  HENT: Negative for congestion, ear discharge, hoarse voice and sore throat.   Eyes: Negative for discharge, redness,  vision loss in right eye and visual halos.  Cardiovascular: Negative for chest pain, dyspnea on exertion, leg swelling, orthopnea and palpitations.  Respiratory: Negative for cough, hemoptysis, shortness of breath and snoring.   Endocrine: Negative for heat intolerance and polyphagia.  Hematologic/Lymphatic: Negative for bleeding problem. Does not bruise/bleed easily.  Skin: Negative for flushing, nail changes, rash and suspicious lesions.  Musculoskeletal: Negative for arthritis, joint pain, muscle cramps, myalgias, neck pain and stiffness.  Gastrointestinal: Negative for abdominal pain, bowel incontinence, diarrhea and excessive appetite.  Genitourinary: Negative for decreased libido, genital sores and incomplete emptying.  Neurological: Negative for brief paralysis, focal weakness, headaches and loss of balance.  Psychiatric/Behavioral: Negative for altered mental status, depression and suicidal ideas.  Allergic/Immunologic: Negative for HIV exposure and persistent infections.    EKGs/Labs/Other Studies Reviewed:    The following studies were reviewed today:   EKG: None today  Echocardiogram IMPRESSIONS    1. Left ventricular ejection fraction, by estimation, is 55 to 60%. The left ventricle has normal function. The left ventricle has no regional wall motion abnormalities. There is moderate concentric left ventricular  hypertrophy. Left ventricular diastolic parameters are consistent with Grade II diastolic dysfunction  (pseudonormalization).  2. Right ventricular systolic function is normal. The right ventricular  size is normal. There is normal pulmonary artery systolic pressure.  3. There is mild posterior mitral annular calcification. No evidence of  mitral valve regurgitation. Mild mitral stenosis. The mean mitral valve  gradient is 3.0 mmHg.  4. The aortic valve is thickened and mildly calcified. Aortic valve  regurgitation is not visualized. Low flow low gradient  moderate to severe  aortic valve stenosis. Aortic valve area, by VTI measures 0.66 cm. Aortic  valve mean gradient measures 21.5  mmHg. Aortic valve Vmax measures 3.08 m/s.  5. There is mild dilatation of the ascending aorta measuring 37 mm.  6. The inferior vena cava is normal in size with greater than 50%  respiratory variability, suggesting right atrial pressure of 3 mmHg.    Recent Labs: No results found for requested labs within last 8760 hours.  Recent Lipid Panel    Component Value Date/Time   CHOL 132 09/14/2019 1006   TRIG 59 09/14/2019 1006   HDL 54 09/14/2019 1006   CHOLHDL 2.4 09/14/2019 1006   LDLCALC 65 09/14/2019 1006    Physical Exam:    VS:  BP (!) 148/64   Pulse 84   Ht 4\' 11"  (1.499 m)   Wt 136 lb 12.8 oz (62.1 kg)   SpO2 93%   BMI 27.63 kg/m     Wt Readings from Last 3 Encounters:  11/02/19 136 lb 12.8 oz (62.1 kg)  08/03/19 139 lb (63 kg)  12/21/18 143 lb (64.9 kg)     GEN: Well nourished, well developed in no acute distress HEENT: Normal NECK: No JVD; No carotid bruits LYMPHATICS: No lymphadenopathy CARDIAC: S1S2 noted,RRR, no murmurs, rubs, gallops RESPIRATORY:  Clear to auscultation without rales, wheezing or rhonchi  ABDOMEN: Soft, non-tender, non-distended, +bowel sounds, no guarding. EXTREMITIES: No edema, No cyanosis, no clubbing MUSCULOSKELETAL:  No deformity  SKIN: Warm and dry NEUROLOGIC:  Alert and oriented x 3, non-focal PSYCHIATRIC:  Normal affect, good insight  ASSESSMENT:    1. Palpitations   2. Shortness of breath   3. Moderate to severe low-flow low gradient aortic stenosis.   4. Essential hypertension    PLAN:     I would like to rule out a cardiovascular etiology of this palpitation, therefore at this time I would like to placed a zio patch for 7 days.  Since her ED visit she has not had any recurrent episodes.  I reviewed her echocardiogram with her which is done in July 2012 show evidence of moderate to severe  low-flow low gradient aortic stenosis with a aortic valve area of 0.6 cm as well as 21.5 mean gradient and peak velocity is 3.8 m/s.  With her shortness of breath, like to send her to evaluated by my colleague in interventional cardiology/structural heart.  She may need further evaluation which could be  dobutamine study versus cardiac catheterization and getting gradients across the valve however I am going to defer to my colleague for this.  Blood work will be done today which include BMP, mag as well as BNP to detect any subclinical heart failure.   The patient is in agreement with the above plan. The patient left the office in stable condition.  The patient will follow up in 8 weeks with Dr. Agustin Cree.   Medication Adjustments/Labs and Tests Ordered: Current medicines are reviewed at length with the patient today.  Concerns regarding medicines are outlined above.  Orders Placed This Encounter  Procedures  . Brain natriuretic peptide  . Basic metabolic panel  . Ambulatory referral to Structural Heart/Valve Clinic (only at [place])  . LONG TERM MONITOR (3-14 DAYS)   No orders of the defined types were placed in this encounter.   Patient Instructions  Medication Instructions:  No medication changes. *If you need a refill on your cardiac medications before your next appointment, please call your pharmacy*   Lab Work: Your physician recommends that you have labs done in the office today. Your test included  basic metabolic panel and BNP.  If you have labs (blood work) drawn today and your tests are completely normal, you will receive your results only by: Marland Kitchen MyChart Message (if you have MyChart) OR . A paper copy in the mail If you have any lab test that is abnormal or we need to change your treatment, we will call you to review the results.   Testing/Procedures:  WHY IS MY DOCTOR PRESCRIBING ZIO? The Zio system is proven and trusted by physicians to detect and diagnose  irregular heart rhythms -- and has been prescribed to hundreds of thousands of patients.  The FDA has cleared the Zio system to monitor for many different kinds of irregular heart rhythms. In a study, physicians were able to reach a diagnosis 90% of the time with the Zio system1.  You can wear the Zio monitor -- a small, discreet, comfortable patch -- during your normal day-to-day activity, including while you sleep, shower, and exercise, while it records every single heartbeat for analysis.  1Barrett, P., et al. Comparison of 24 Hour Holter Monitoring Versus 14 Day Novel Adhesive Patch Electrocardiographic Monitoring. [place], 2014.  ZIO VS. HOLTER MONITORING The Zio monitor can be comfortably worn for up to 14 days. Holter monitors can be worn for 24 to 48 hours, limiting the time to record any irregular heart rhythms you may have. Zio is able to capture data for the 51% of patients who have their first symptom-triggered arrhythmia after 48 hours.1  LIVE WITHOUT RESTRICTIONS The Zio ambulatory cardiac monitor is a small, unobtrusive, and water-resistant patch--you might even forget you're wearing it. The Zio monitor records and stores every beat of your heart, whether you're sleeping, working out, or showering. Wear the monitor for 7 days. Remove 11/09/19.   Follow-Up: At [facility], you and your health needs are our priority.  As part of our continuing mission to provide you with exceptional heart care, we have created designated Provider Care Teams.  These Care Teams include your primary Cardiologist (physician) and Advanced Practice Providers (APPs -  Physician Assistants and Nurse Practitioners) who all work together to provide you with the care you need, when you need it.  We recommend signing up for the patient portal called "MyChart".  Sign up information is provided on this After Visit Summary.  MyChart is used to connect  with patients for Virtual Visits  (Telemedicine).  Patients are able to view lab/test results, encounter notes, upcoming appointments, etc.  Non-urgent messages can be sent to your provider as well.   To learn more about what you can do with MyChart, go to [email or web address].    Your next appointment:   3 month(s)  The format for your next appointment:   In Person  Provider:   Jenne Campus, MD   Other Instructions NA     Adopting a Healthy Lifestyle.  Know what a healthy weight is for you (roughly BMI <25) and aim to maintain this   Aim for 7+ servings of fruits and vegetables daily   65-80+ fluid ounces of water or unsweet tea for healthy kidneys   Limit to max 1 drink of alcohol per day; avoid smoking/tobacco   Limit animal fats in diet for cholesterol and heart health - choose grass fed whenever available   Avoid highly processed foods, and foods high in saturated/trans fats   Aim for low stress - take time to unwind and care for your mental health   Aim for 150 min of moderate intensity exercise weekly for heart health, and weights twice weekly for bone health   Aim for 7-9 hours of sleep daily   When it comes to diets, agreement about the perfect plan isnt easy to find, even among the experts. Experts at the [employer] developed an idea known as the Healthy Eating Plate. Just imagine a plate divided into logical, healthy portions.   The emphasis is on diet quality:   Load up on vegetables and fruits - one-half of your plate: Aim for color and variety, and remember that potatoes dont count.   Go for whole grains - one-quarter of your plate: Whole wheat, barley, wheat berries, quinoa, oats, brown rice, and foods made with them. If you want pasta, go with whole wheat pasta.   Protein power - one-quarter of your plate: Fish, chicken, beans, and nuts are all healthy, versatile protein sources. Limit red meat.   The diet, however, does go beyond the plate, offering a few  other suggestions.   Use healthy plant oils, such as olive, canola, soy, corn, sunflower and peanut. Check the labels, and avoid partially hydrogenated oil, which have unhealthy trans fats.   If youre thirsty, drink water. Coffee and tea are good in moderation, but skip sugary drinks and limit milk and dairy products to one or two daily servings.   The type of carbohydrate in the diet is more important than the amount. Some sources of carbohydrates, such as vegetables, fruits, whole grains, and beans-are healthier than others.   Finally, stay active  Signed, Berniece Salines, DO  11/02/2019 3:01 PM    [facility]

## 2019-11-03 LAB — BASIC METABOLIC PANEL
BUN/Creatinine Ratio: 17 (ref 12–28)
BUN: 19 mg/dL (ref 8–27)
CO2: 32 mmol/L — ABNORMAL HIGH (ref 20–29)
Calcium: 10.1 mg/dL (ref 8.7–10.3)
Chloride: 99 mmol/L (ref 96–106)
Creatinine, Ser: 1.1 mg/dL — ABNORMAL HIGH (ref 0.57–1.00)
GFR calc Af Amer: 51 mL/min/{1.73_m2} — ABNORMAL LOW (ref >59)
GFR calc non Af Amer: 45 mL/min/{1.73_m2} — ABNORMAL LOW (ref >59)
Glucose: 102 mg/dL — ABNORMAL HIGH (ref 65–99)
Potassium: 3.9 mmol/L (ref 3.5–5.2)
Sodium: 144 mmol/L (ref 134–144)

## 2019-11-03 LAB — BRAIN NATRIURETIC PEPTIDE: BNP: 87.6 pg/mL (ref 0.0–100.0)

## 2019-11-04 ENCOUNTER — Telehealth: Payer: Self-pay

## 2019-11-04 NOTE — Telephone Encounter (Signed)
-----   Message from Berniece Salines, DO sent at 11/03/2019  6:07 PM EDT ----- Your electrolytes are fine, your creatinine is at his baseline.

## 2019-11-04 NOTE — Telephone Encounter (Signed)
Spoke with patient regarding results and recommendation.  Patient verbalizes understanding and is agreeable to plan of care. Advised patient to call back with any issues or concerns.  

## 2019-11-05 DIAGNOSIS — I10 Essential (primary) hypertension: Secondary | ICD-10-CM | POA: Diagnosis not present

## 2019-11-05 DIAGNOSIS — Z2821 Immunization not carried out because of patient refusal: Secondary | ICD-10-CM | POA: Diagnosis not present

## 2019-11-05 DIAGNOSIS — R002 Palpitations: Secondary | ICD-10-CM | POA: Diagnosis not present

## 2019-11-05 DIAGNOSIS — J449 Chronic obstructive pulmonary disease, unspecified: Secondary | ICD-10-CM | POA: Diagnosis not present

## 2019-11-05 DIAGNOSIS — Z6827 Body mass index (BMI) 27.0-27.9, adult: Secondary | ICD-10-CM | POA: Diagnosis not present

## 2019-11-16 DIAGNOSIS — R002 Palpitations: Secondary | ICD-10-CM | POA: Diagnosis not present

## 2019-11-19 ENCOUNTER — Telehealth: Payer: Self-pay | Admitting: Cardiovascular Disease

## 2019-11-19 MED ORDER — POTASSIUM CHLORIDE CRYS ER 10 MEQ PO TBCR: 10.0000 meq | EXTENDED_RELEASE_TABLET | Freq: Two times a day (BID) | ORAL | 1 refills | Status: DC

## 2019-11-19 NOTE — Telephone Encounter (Signed)
Pt daughter called in and stated that pt was give the Potassium in hosp and they have 1 pill left.  She would like to know if pt is to continue to take this?  Pt has an appt on Monday /    Best number for daughter is [phone]

## 2019-11-19 NOTE — Telephone Encounter (Addendum)
RX for [place] sent to [facility] per the pts daughter request.

## 2019-11-19 NOTE — Progress Notes (Signed)
[facility] Consult Note  Chief Complaint  Patient presents with  . Follow-up    Severe aortic stenosis    History of Present Illness: 86 yo female with history of COPD, CAD, prior CVA, chronic diastolic CHF, HTN, hyperlipidemia, prior tobacco abuse and carotid artery disease with prior who is here today as a new consult, referred by Dr. Harriet Masson, for further evaluation of her severe aortic stenosis and discussion regarding TAVR. Echo July 2021 with LVEF=55-60% with moderate concentric LVH. The aortic valve is thickened with mild calcification. Mean gradient 21.5 mmHg, AVA 0.66 cm2, dimensionless index 0.29. She was seen in the office by Dr. Harriet Masson 11/02/19 and c/o fatigue. No chest pain, near syncope or syncope.   She tells me today that she has no chest pain or dyspnea. She has rare dizziness. She has noted mild fatigue over the past year. She has swelling in both ankles but this resolves at night. She lives alone in [place]. She goes to the dentist regularly. She has dental implants and is working closely with the dentist. She is retired from [employer] work.   Primary Care Physician: Angelina Sheriff, MD Primary Cardiologist: Berniece Salines Referring Cardiologist: Berniece Salines  Past Medical History:  Diagnosis Date  . Aortic stenosis 11/09/2014   Overview:  Mild in 2015 Moderate to severe in the October 2018  . Chronic respiratory failure with hypoxia (HCC) 08/06/2018   02 hs x 2lpm as of 08/05/2018 and prn daytime to keep sats > 90%  - 09/01/2018 no desats walking / limited by back   . COPD (chronic obstructive pulmonary disease) ([place])   . Coronary artery disease   . CVA (cerebral vascular accident) ([place])   . Diastolic congestive heart failure ([place]) 07/31/2018  . DOE (dyspnea on exertion) 12/11/2016   Quit smoking 07/22/18 with emphysema on ct from 06/12/2018 - PFT 02/17/17    FEV1 1.35 [118%], ratio 0.82 with 14% resp to saba and TLC 93%, DLCO 73% - Echo 01/20/2018 c/w mod AS s PH - pt  stopped vasotec and anoro on her own prior to pulmonary ov 08/05/2018 > rec leave off and rx for gerd x 4 weeks > improved as of 09/01/2018  - 09/01/2018   Walked RA x one lap =  approx 250 ft avg pace >> stopped due to  . Essential hypertension 08/18/2013   rec Off acei permanently as of 08/05/2018 due to pseudowheeze   . Greater trochanteric bursitis of right hip 11/13/2017  . Hyperlipemia 08/18/2013  . Hyperlipidemia   . Hypertension   . Late effects of CVA (cerebrovascular accident) 11/09/2014  . Lumbar degenerative disc disease 06/18/2013  . Lumbar spondylosis 06/18/2013  . Peripheral vascular disease, unspecified ([place]) status post right carotic endarterectomy 08/28/2018  . Sacroiliac inflammation ([place]) 11/13/2017  . Sacroiliitis ([place]) 11/13/2017  . Smoking 05/23/2015  . Spinal stenosis of lumbar region with neurogenic claudication 08/18/2013  . Stroke ([place]) 09/08/2013  . Thoracic aortic atherosclerosis [facility])     Past Surgical History:  Procedure Laterality Date  . CAROTID ENDARTERECTOMY Bilateral   . NECK SURGERY      Current Outpatient Medications  Medication Sig Dispense Refill  . amLODipine (NORVASC) 5 MG tablet Take 5 mg daily by mouth.    Marland Kitchen atorvastatin (LIPITOR) 40 MG tablet Please take 1 tablet, once a day 90 tablet 1  . chlorhexidine (PERIDEX) 0.12 % solution Use as directed    . clopidogrel (PLAVIX) 75 MG tablet Take 75 mg  by mouth daily.     . famotidine (PEPCID) 20 MG tablet One after supper 30 tablet 11  . furosemide (LASIX) 40 MG tablet Take 40 mg daily by mouth.    Marland Kitchen ipratropium-albuterol (DUONEB) 0.5-2.5 (3) MG/3ML SOLN Take 3 mLs by nebulization every 4 (four) hours as needed.    Marland Kitchen levothyroxine (SYNTHROID, LEVOTHROID) 50 MCG tablet Take 1 tablet daily by mouth.    . metoprolol tartrate (LOPRESSOR) 25 MG tablet Take 12.5 mg by mouth 2 (two) times daily.    . OXYGEN 2lpm as needed    . pantoprazole (PROTONIX) 40 MG tablet TAKE 1 TABLET BY MOUTH IN THE MORNING, TAKE 30-60  MINUTES BEFORE FIRST MEAL OF THE DAY 30 tablet 0  . potassium chloride (KLOR-CON) 10 MEQ tablet Take 1 tablet (10 mEq total) by mouth 2 (two) times daily. 180 tablet 1  . Vitamin D, Ergocalciferol, (DRISDOL) 1.25 MG (50000 UT) CAPS capsule Take 50,000 Units by mouth every 7 (seven) days.     No current facility-administered medications for this visit.    Allergies  Allergen Reactions  . Codeine Hives and Nausea Only    Social History   Socioeconomic History  . Marital status: Widowed    Spouse name: Not on file  . Number of children: 8  . Years of education: Not on file  . Highest education level: Not on file  Occupational History  . 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 Smoking status: Current Every Day Smoker    Packs/day: 1.00    Years: 35.00    Pack years: 35.00    Types: Cigarettes    Last attempt to quit: 07/22/2018    Years since quitting: 1.3  . Smokeless tobacco: Never Used  Vaping Use  . Vaping Use: Former  Substance and Sexual Activity  . Alcohol use: No  . Drug use: No  . Sexual activity: Not on file  Other Topics Concern  . Not on file  Social History Narrative  . Not on file   Social Determinants of Health   Financial Resource Strain:   . Difficulty of Paying Living Expenses: Not on file  Food Insecurity:   . Worried About Charity fundraiser in the Last Year: Not on file  . Ran Out of Food in the Last Year: Not on file  Transportation Needs:   . Lack of Transportation (Medical): Not on file  . Lack of Transportation (Non-Medical): Not on file  Physical Activity:   . Days of Exercise per Week: Not on file  . Minutes of Exercise per Session: Not on file  Stress:   . Feeling of Stress : Not on file  Social Connections:   . Frequency of Communication with Friends and Family: Not on file  . Frequency of Social Gatherings with Friends and Family: Not on file  . Attends Religious Services: Not on file  . Active Member of  Clubs or Organizations: Not on file  . Attends Archivist Meetings: Not on file  . Marital Status: Not on file  Intimate Partner Violence:   . Fear of Current or Ex-Partner: Not on file  . Emotionally Abused: Not on file  . Physically Abused: Not on file  . Sexually Abused: Not on file    Family History  Problem Relation Age of Onset  . Heart disease Mother   . Hypertension Mother   . Heart disease Father     Review of  Systems:  As stated in the HPI and otherwise negative.   BP 126/60   Pulse 86   Ht 4\' 11"  (1.499 m)   Wt 138 lb 12.8 oz (63 kg)   SpO2 95%   BMI 28.03 kg/m   Physical Examination: General: Well developed, well nourished, NAD  HEENT: OP clear, mucus membranes moist  SKIN: warm, dry. No rashes. Neuro: No focal deficits  Musculoskeletal: Muscle strength 5/5 all ext  Psychiatric: Mood and affect normal  Neck: No JVD, no carotid bruits, no thyromegaly, no lymphadenopathy.  Lungs:Clear bilaterally, no wheezes, rhonci, crackles Cardiovascular: Regular rate and rhythm. Loud, harsh, late peaking systolic murmur.  Abdomen:Soft. Bowel sounds present. Non-tender.  Extremities: No lower extremity edema. Pulses are 2 + in the bilateral DP/PT.  EKG:  EKG is ordered today. The ekg ordered today demonstrates Sinus rhythm, Poor R wave progression  Echo 08/19/19: 1. Left ventricular ejection fraction, by estimation, is 55 to 60%. The  left ventricle has normal function. The left ventricle has no regional  wall motion abnormalities. There is moderate concentric left ventricular  hypertrophy. Left ventricular  diastolic parameters are consistent with Grade II diastolic dysfunction  (pseudonormalization).  2. Right ventricular systolic function is normal. The right ventricular  size is normal. There is normal pulmonary artery systolic pressure.  3. There is mild posterior mitral annular calcification. No evidence of  mitral valve regurgitation. Mild mitral  stenosis. The mean mitral valve  gradient is 3.0 mmHg.  4. The aortic valve is thickened and mildly calcified. Aortic valve  regurgitation is not visualized. Low flow low gradient moderate to severe  aortic valve stenosis. Aortic valve area, by VTI measures 0.66 cm. Aortic  valve mean gradient measures 21.5  mmHg. Aortic valve Vmax measures 3.08 m/s.  5. There is mild dilatation of the ascending aorta measuring 37 mm.  6. The inferior vena cava is normal in size with greater than 50%  respiratory variability, suggesting right atrial pressure of 3 mmHg.   FINDINGS  Left Ventricle: Left ventricular ejection fraction, by estimation, is 55  to 60%. The left ventricle has normal function. The left ventricle has no  regional wall motion abnormalities. The left ventricular internal cavity  size was normal in size. There is  moderate concentric left ventricular hypertrophy. Left ventricular  diastolic parameters are consistent with Grade II diastolic dysfunction  (pseudonormalization).   Right Ventricle: The right ventricular size is normal. No increase in  right ventricular wall thickness. Right ventricular systolic function is  normal. There is normal pulmonary artery systolic pressure. The tricuspid  regurgitant velocity is 2.61 m/s, and  with an assumed right atrial pressure of 3 mmHg, the estimated right  ventricular systolic pressure is 29.7 mmHg.   Left Atrium: Left atrial size was normal in size.   Right Atrium: Right atrial size was normal in size.   Pericardium: There is no evidence of pericardial effusion. Presence of  pericardial fat pad.   Mitral Valve: The mitral valve is abnormal. Normal mobility of the mitral  valve leaflets. Mild mitral annular calcification. No evidence of mitral  valve regurgitation. Mild mitral valve stenosis. MV peak gradient, 7.3  mmHg. The mean mitral valve gradient  is 3.0 mmHg.   Tricuspid Valve: The tricuspid valve is normal in  structure. Tricuspid  valve regurgitation is trivial. No evidence of tricuspid stenosis.   Aortic Valve: The aortic valve is abnormal. . There is mild thickening and  mild calcification of the aortic valve. Aortic  valve regurgitation is not  visualized. Moderate to severe aortic stenosis is present. Mild aortic  valve annular calcification. There  is mild thickening of the aortic valve. There is mild calcification of  the aortic valve. Aortic valve mean gradient measures 21.5 mmHg. Aortic  valve peak gradient measures 37.9 mmHg. Aortic valve area, by VTI measures  0.66 cm.   Pulmonic Valve: The pulmonic valve was normal in structure. Pulmonic valve  regurgitation is not visualized. No evidence of pulmonic stenosis.   Aorta: Aortic dilatation noted. There is mild dilatation of the ascending  aorta measuring 37 mm.   Venous: The inferior vena cava is normal in size with greater than 50%  respiratory variability, suggesting right atrial pressure of 3 mmHg.   IAS/Shunts: No atrial level shunt detected by color flow Doppler.     LEFT VENTRICLE  PLAX 2D  LVIDd:     3.40 cm Diastology  LVIDs:     2.50 cm LV e' lateral:  5.19 cm/s  LV PW:     1.40 cm LV E/e' lateral: 22.9  LV IVS:    1.40 cm LV e' medial:  3.73 cm/s  LVOT diam:   1.70 cm LV E/e' medial: 31.9  LV SV:     50  LV SV Index:  32  LVOT Area:   2.27 cm     RIGHT VENTRICLE       IVC  RV S prime:   10.90 cm/s IVC diam: 0.90 cm  TAPSE (M-mode): 2.3 cm   LEFT ATRIUM       Index    RIGHT ATRIUM      Index  LA diam:    3.30 cm 2.09 cm/m RA Area:   12.80 cm  LA Vol (A2C):  39.3 ml 24.87 ml/m RA Volume:  26.80 ml 16.96 ml/m  LA Vol (A4C):  43.8 ml 27.72 ml/m  LA Biplane Vol: 44.1 ml 27.91 ml/m  AORTIC VALVE  AV Area (Vmax):  0.67 cm  AV Area (Vmean):  0.71 cm  AV Area (VTI):   0.66 cm  AV Vmax:      308.00 cm/s  AV Vmean:      221.500 cm/s  AV VTI:      0.764 m  AV Peak Grad:   37.9 mmHg  AV Mean Grad:   21.5 mmHg  LVOT Vmax:     91.00 cm/s  LVOT Vmean:    69.300 cm/s  LVOT VTI:     0.221 m  LVOT/AV VTI ratio: 0.29    AORTA  Ao Root diam: 2.90 cm  Ao Asc diam: 3.70 cm   MITRAL VALVE        TRICUSPID VALVE  MV Area (PHT): 2.32 cm   TR Peak grad:  27.2 mmHg  MV Peak grad: 7.3 mmHg   TR Vmax:    261.00 cm/s  MV Mean grad: 3.0 mmHg  MV Vmax:    1.35 m/s   SHUNTS  MV Vmean:   87.0 cm/s  Systemic VTI: 0.22 m  MV Decel Time: 327 msec   Systemic Diam: 1.70 cm  MV E velocity: 119.00 cm/s  MV A velocity: 119.00 cm/s  MV E/A ratio: 1.00   Recent Labs: 11/02/2019: BNP 87.6; BUN 19; Creatinine, Ser 1.10; Potassium 3.9; Sodium 144   Lipid Panel    Component Value Date/Time   CHOL 132 09/14/2019 1006   TRIG 59 09/14/2019 1006   HDL 54 09/14/2019 1006   CHOLHDL 2.4 09/14/2019 1006  [place] 65 09/14/2019 1006     Wt Readings from Last 3 Encounters:  11/22/19 138 lb 12.8 oz (63 kg)  11/02/19 136 lb 12.8 oz (62.1 kg)  08/03/19 139 lb (63 kg)     Other studies Reviewed: Additional studies/ records that were reviewed today include: EKG, echo images, office notes Review of the above records demonstrates: Moderately severe AS   Assessment and Plan:   1. Severe Aortic Valve Stenosis: She has moderately severe to severe aortic valve stenosis. I have personally reviewed the echo images. The aortic valve is thickened, calcified with restricted leaflet excursion. This likely represents paradoxical low flow, low gradient AS. The AVA is below 0.8 cm2 with mean gradient in the low 20s and dimensionless index around 0.29. I think she would likely benefit from AVR but at this time is overall asymptomatic. Given advanced age, she is not a good candidate for conventional AVR by surgical approach. I think she may be a good candidate for TAVR.   STS Risk  Score: Risk of Mortality: 5.876% Renal Failure: 2.760% Permanent Stroke: 2.770% Prolonged Ventilation: 15.408% DSW Infection: 0.105% Reoperation: 3.159% Morbidity or Mortality: 19.718% Short Length of Stay: 14.977% Long Length of Stay: 12.444%   I have reviewed the natural history of aortic stenosis with the patient and their family members  who are present today. We have discussed the limitations of medical therapy and the poor prognosis associated with symptomatic aortic stenosis. We have reviewed potential treatment options, including palliative medical therapy, conventional surgical aortic valve replacement, and transcatheter aortic valve replacement. We discussed treatment options in the context of the patient's specific comorbid medical conditions.   I will see her back in 3 months. She will call with any change in her symptoms prior to then.     Current medicines are reviewed at length with the patient today.  The patient does not have concerns regarding medicines.  The following changes have been made:  no change  Labs/ tests ordered today include:  No orders of the defined types were placed in this encounter.    Disposition:   FU with me in 3 months.    Signed, Lauree Chandler, MD 11/22/2019 11:53 AM    [facility] [address], [place], [place]  [address] Phone: [phone]; Fax: [phone]

## 2019-11-19 NOTE — Telephone Encounter (Signed)
Will send notes to primary card

## 2019-11-22 ENCOUNTER — Other Ambulatory Visit: Payer: Self-pay

## 2019-11-22 ENCOUNTER — Encounter: Payer: Self-pay | Admitting: Cardiovascular Disease

## 2019-11-22 ENCOUNTER — Ambulatory Visit: Payer: Medicare HMO | Admitting: Cardiovascular Disease

## 2019-11-22 VITALS — BP 126/60 | HR 86 | Ht 59.0 in | Wt 138.8 lb

## 2019-11-22 DIAGNOSIS — I350 Nonrheumatic aortic (valve) stenosis: Secondary | ICD-10-CM

## 2019-11-22 MED ORDER — METOPROLOL TARTRATE 25 MG PO TABS: 25.0000 mg | ORAL_TABLET | Freq: Two times a day (BID) | ORAL | 3 refills | Status: DC

## 2019-11-22 NOTE — Patient Instructions (Signed)
Medication Instructions:  Your physician has recommended you make the following change in your medication:  1.) increase metoprolol to 25 mg twice daily  *If you need a refill on your cardiac medications before your next appointment, please call your pharmacy*   Lab Work: none If you have labs (blood work) drawn today and your tests are completely normal, you will receive your results only by: Marland Kitchen MyChart Message (if you have MyChart) OR . A paper copy in the mail If you have any lab test that is abnormal or we need to change your treatment, we will call you to review the results.   Testing/Procedures: none   Follow-Up: At [facility], you and your health needs are our priority.  As part of our continuing mission to provide you with exceptional heart care, we have created designated Provider Care Teams.  These Care Teams include your primary Cardiologist (physician) and Advanced Practice Providers (APPs -  Physician Assistants and Nurse Practitioners) who all work together to provide you with the care you need, when you need it.  We recommend signing up for the patient portal called "MyChart".  Sign up information is provided on this After Visit Summary.  MyChart is used to connect with patients for Virtual Visits (Telemedicine).  Patients are able to view lab/test results, encounter notes, upcoming appointments, etc.  Non-urgent messages can be sent to your provider as well.   To learn more about what you can do with MyChart, go to [email or web address].    Your next appointment:   3 month(s)  The format for your next appointment:   In Person  Provider:   Lauree Chandler, MD   Other Instructions

## 2019-11-30 DIAGNOSIS — J449 Chronic obstructive pulmonary disease, unspecified: Secondary | ICD-10-CM | POA: Diagnosis not present

## 2019-12-07 ENCOUNTER — Ambulatory Visit: Payer: Medicare HMO | Admitting: Cardiology

## 2019-12-31 DIAGNOSIS — J449 Chronic obstructive pulmonary disease, unspecified: Secondary | ICD-10-CM | POA: Diagnosis not present

## 2020-01-13 DIAGNOSIS — R300 Dysuria: Secondary | ICD-10-CM | POA: Diagnosis not present

## 2020-01-27 ENCOUNTER — Other Ambulatory Visit: Payer: Self-pay

## 2020-01-30 DIAGNOSIS — J449 Chronic obstructive pulmonary disease, unspecified: Secondary | ICD-10-CM | POA: Diagnosis not present

## 2020-02-07 ENCOUNTER — Other Ambulatory Visit: Payer: Self-pay

## 2020-02-07 ENCOUNTER — Ambulatory Visit: Payer: Medicare HMO | Admitting: Cardiology

## 2020-02-07 ENCOUNTER — Encounter: Payer: Self-pay | Admitting: Cardiology

## 2020-02-07 VITALS — BP 156/56 | HR 63 | Ht 59.0 in | Wt 142.0 lb

## 2020-02-07 DIAGNOSIS — I10 Essential (primary) hypertension: Secondary | ICD-10-CM

## 2020-02-07 DIAGNOSIS — I739 Peripheral vascular disease, unspecified: Secondary | ICD-10-CM | POA: Diagnosis not present

## 2020-02-07 DIAGNOSIS — I2510 Atherosclerotic heart disease of native coronary artery without angina pectoris: Secondary | ICD-10-CM | POA: Diagnosis not present

## 2020-02-07 DIAGNOSIS — I350 Nonrheumatic aortic (valve) stenosis: Secondary | ICD-10-CM | POA: Diagnosis not present

## 2020-02-07 NOTE — Patient Instructions (Signed)

## 2020-02-07 NOTE — Progress Notes (Signed)
Cardiology Office Note:    Date:  02/07/2020   ID:  Annette Huang, DOB 1927-11-11, MRN [id number]  PCP:  Angelina Sheriff, MD  Cardiologist:  Jenne Campus, MD    Referring MD: Angelina Sheriff, MD   Chief Complaint  Patient presents with  . Follow-up  I am doing fine  History of Present Illness:    Annette Huang is a 87 y.o. female with past medical history significant for at least moderate to severe aortic stenosis which is probably low flow low gradient, COPD, smoking which is still ongoing, preserved left ventricular ejection fraction, peripheral vascular disease in form of carotid arterial disease, hypertension, dyslipidemia.  She comes today to my office for follow-up she did see structural heart team for consideration of arctic valve implantation, however she still did not make up her mind and on my questioning today how she is doing she says she is doing well she still does have some shortness of breath is unchanged, denies have any dizziness or passing out there is no chest pain.  And she told me today she is not too crazy about fixing her valve she said it sounds spooky.  We did talk in length about this we did talk about prognosis of people with severe arctic stenosis when they become symptomatic which gives him typical survival of 3 to 5 years.  She understood that and she is happy the way she is right now.  Past Medical History:  Diagnosis Date  . Aortic stenosis 11/09/2014   Overview:  Mild in 2015 Moderate to severe in the October 2018  . Chronic respiratory failure with hypoxia (HCC) 08/06/2018   02 hs x 2lpm as of 08/05/2018 and prn daytime to keep sats > 90%  - 09/01/2018 no desats walking / limited by back   . COPD (chronic obstructive pulmonary disease) ([place])   . Coronary artery disease   . CVA (cerebral vascular accident) ([place])   . Diastolic congestive heart failure ([place]) 07/31/2018  . DOE (dyspnea on exertion) 12/11/2016   Quit smoking 07/22/18 with emphysema  on ct from 06/12/2018 - PFT 02/17/17    FEV1 1.35 [118%], ratio 0.82 with 14% resp to saba and TLC 93%, DLCO 73% - Echo 01/20/2018 c/w mod AS s PH - pt stopped vasotec and anoro on her own prior to pulmonary ov 08/05/2018 > rec leave off and rx for gerd x 4 weeks > improved as of 09/01/2018  - 09/01/2018   Walked RA x one lap =  approx 250 ft avg pace >> stopped due to  . Essential hypertension 08/18/2013   rec Off acei permanently as of 08/05/2018 due to pseudowheeze   . Greater trochanteric bursitis of right hip 11/13/2017  . Hyperlipemia 08/18/2013  . Hyperlipidemia   . Hypertension   . Late effects of CVA (cerebrovascular accident) 11/09/2014  . Lumbar degenerative disc disease 06/18/2013  . Lumbar spondylosis 06/18/2013  . Peripheral vascular disease, unspecified ([place]) status post right carotic endarterectomy 08/28/2018  . Sacroiliac inflammation ([place]) 11/13/2017  . Sacroiliitis ([place]) 11/13/2017  . Smoking 05/23/2015  . Spinal stenosis of lumbar region with neurogenic claudication 08/18/2013  . Stroke ([place]) 09/08/2013  . Thoracic aortic atherosclerosis [facility])     Past Surgical History:  Procedure Laterality Date  . CAROTID ENDARTERECTOMY Bilateral   . NECK SURGERY      Current Medications: Current Meds  Medication Sig  . albuterol (VENTOLIN HFA) 108 (90 Base) MCG/ACT inhaler Inhale 1  puff into the lungs as needed.  Marland Kitchen amLODipine (NORVASC) 5 MG tablet Take 5 mg daily by mouth.  Marland Kitchen atorvastatin (LIPITOR) 40 MG tablet Please take 1 tablet, once a day  . chlorhexidine (PERIDEX) 0.12 % solution Use as directed  . clopidogrel (PLAVIX) 75 MG tablet Take 75 mg by mouth daily.   . famotidine (PEPCID) 20 MG tablet One after supper  . furosemide (LASIX) 40 MG tablet Take 40 mg daily by mouth.  Marland Kitchen ipratropium-albuterol (DUONEB) 0.5-2.5 (3) MG/3ML SOLN Take 3 mLs by nebulization every 4 (four) hours as needed.  Marland Kitchen levothyroxine (SYNTHROID, LEVOTHROID) 50 MCG tablet Take 1 tablet daily by mouth.  . metoprolol  tartrate (LOPRESSOR) 25 MG tablet Take 1 tablet (25 mg total) by mouth 2 (two) times daily.  . OXYGEN 2lpm as needed  . pantoprazole (PROTONIX) 40 MG tablet TAKE 1 TABLET BY MOUTH IN THE MORNING, TAKE 30-60 MINUTES BEFORE FIRST MEAL OF THE DAY  . potassium chloride (KLOR-CON) 10 MEQ tablet Take 1 tablet (10 mEq total) by mouth 2 (two) times daily.  . Vitamin D, Ergocalciferol, (DRISDOL) 1.25 MG (50000 UT) CAPS capsule Take 50,000 Units by mouth every 7 (seven) days.     Allergies:   Codeine   Social History   Socioeconomic History  . Marital status: Widowed    Spouse name: Not on file  . Number of children: 8  . Years of education: Not on file  . Highest education level: Not on file  Occupational History  . 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 Smoking status: Current Every Day Smoker    Packs/day: 1.00    Years: 35.00    Pack years: 35.00    Types: Cigarettes    Last attempt to quit: 07/22/2018    Years since quitting: 1.5  . Smokeless tobacco: Never Used  Vaping Use  . Vaping Use: Former  Substance and Sexual Activity  . Alcohol use: No  . Drug use: No  . Sexual activity: Not on file  Other Topics Concern  . Not on file  Social History Narrative  . Not on file   Social Determinants of Health   Financial Resource Strain: Not on file  Food Insecurity: Not on file  Transportation Needs: Not on file  Physical Activity: Not on file  Stress: Not on file  Social Connections: Not on file     Family History: The patient's family history includes Heart disease in her father and mother; Hypertension in her mother. ROS:   Please see the history of present illness.    All 14 point review of systems negative except as described per history of present illness  EKGs/Labs/Other Studies Reviewed:      Recent Labs: 11/02/2019: BNP 87.6; BUN 19; Creatinine, Ser 1.10; Potassium 3.9; Sodium 144  Recent Lipid Panel    Component Value Date/Time    CHOL 132 09/14/2019 1006   TRIG 59 09/14/2019 1006   HDL 54 09/14/2019 1006   CHOLHDL 2.4 09/14/2019 1006   LDLCALC 65 09/14/2019 1006    Physical Exam:    VS:  BP (!) 156/56 (BP Location: Right Arm, Patient Position: Sitting)   Pulse 63   Ht 4\' 11"  (1.499 m)   Wt 142 lb (64.4 kg)   SpO2 97%   BMI 28.68 kg/m     Wt Readings from Last 3 Encounters:  02/07/20 142 lb (64.4 kg)  11/22/19 138 lb 12.8 oz (63 kg)  11/02/19  136 lb 12.8 oz (62.1 kg)     GEN:  Well nourished, well developed in no acute distress HEENT: Normal NECK: No JVD; No carotid bruits LYMPHATICS: No lymphadenopathy CARDIAC: RRR, systolic ejection murmur grade 2/6 to 3/6 best heard right upper portion of the sternum, no rubs, no gallops RESPIRATORY:  Clear to auscultation without rales, wheezing or rhonchi  ABDOMEN: Soft, non-tender, non-distended MUSCULOSKELETAL:  No edema; No deformity  SKIN: Warm and dry LOWER EXTREMITIES: no swelling NEUROLOGIC:  Alert and oriented x 3 PSYCHIATRIC:  Normal affect   ASSESSMENT:    1. Nonrheumatic aortic valve stenosis   2. Essential hypertension   3. Coronary artery disease involving native coronary artery of native heart without angina pectoris   4. Peripheral vascular disease, unspecified ([place]) status post right carotic endarterectomy    PLAN:    In order of problems listed above:  1. Nonrheumatic aortic valve stenosis which is moderate to severe.  Likely she does not have new symptoms that would indicate need to quickly proceed to some intervention.  She denies having any more shortness of breath dizziness or passing out or chest pain.  And she is very happy the way she is. 2. Essential hypertension blood pressure elevated today but she is excited.  We will check in before she leaves the room. 3. Dyslipidemia I did review her K PN from September 14, 2019 showing LDL 65 and HDL 54.  We will continue present management which include Lipitor 40. 4. Peripheral vascular  disease I did review her carotic ultrasounds from the spring of this year did not show critical lesion. 5. Smoking obviously still a problem she still continues to smoke she told me that she doubts she will be able to quit.  We will continue trying to encourage her for it.   Medication Adjustments/Labs and Tests Ordered: Current medicines are reviewed at length with the patient today.  Concerns regarding medicines are outlined above.  No orders of the defined types were placed in this encounter.  Medication changes: No orders of the defined types were placed in this encounter.   Signed, Park Liter, MD, [facility] 02/07/2020 1:12 PM    [place]

## 2020-02-23 ENCOUNTER — Other Ambulatory Visit: Payer: Self-pay | Admitting: Cardiology

## 2020-02-23 NOTE — Telephone Encounter (Signed)
Refill sent to pharmacy.   

## 2020-03-01 DIAGNOSIS — J449 Chronic obstructive pulmonary disease, unspecified: Secondary | ICD-10-CM | POA: Diagnosis not present

## 2020-03-06 ENCOUNTER — Telehealth: Payer: Self-pay | Admitting: Cardiovascular Disease

## 2020-03-06 NOTE — Telephone Encounter (Signed)
    Annette Huang is calling, would like to r/s pt's appt around spring. Dr. Angelena Form doesn't have available appt. Pt asked to send message to the nurse. She also added any day is fine except thursdays.

## 2020-03-08 NOTE — Telephone Encounter (Signed)
Spoke w patient's daughter.  She is due for continuing TAVR discussion.  She is feeling good and having no symptoms.  Her daughter requested the appointment be moved out due to her being 87 years old, the cold temps, trying to keep her safe.  I rescheduled her for 05/04/19.  They will call sooner if she develops SOB, swelling, increased fatigue or any other symptoms.

## 2020-03-09 ENCOUNTER — Ambulatory Visit: Payer: Medicare HMO | Admitting: Cardiovascular Disease

## 2020-03-13 IMAGING — CT CT HEART SCORING
2 series · 16 of 20 positions shown, 18 images · non-contrast
Comparison: CT chest 01/11/2008 [REDACTED].

CLINICAL DATA: Risk stratification

EXAM:
Coronary Calcium Score
TECHNIQUE: The patient was scanned on a Siemens Force scanner. Axial
non-contrast 3 mm slices were carried out through the heart. The
data set was analyzed on a dedicated work station and scored using
the Agatson method.

[Series 3: casc 3.0 i36f 2 bestdiast 67 % · axial · 0.37mm/px · z∈[-226,-142]mm · 8 of 37 slices shown, 10 images]
[im 5/37  vessel]
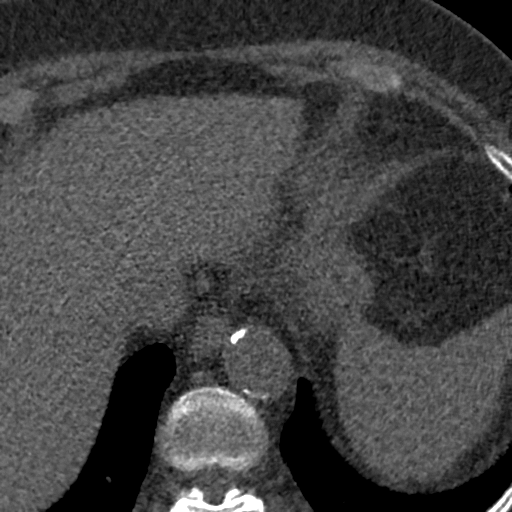
[im 5/37  lung]
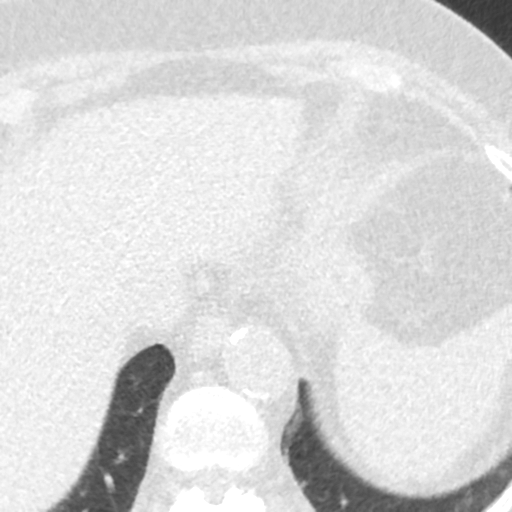
[im 9/37  vessel]
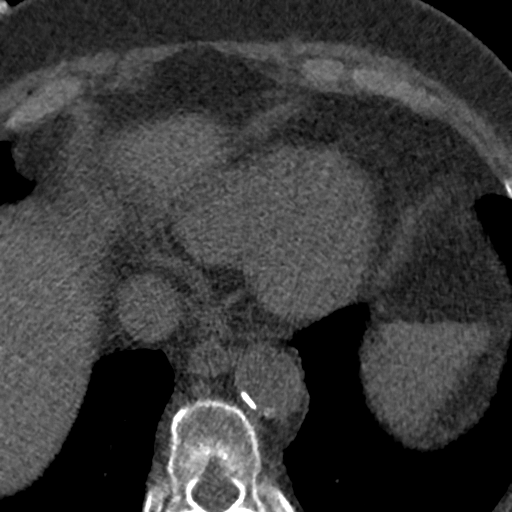
[im 13/37  vessel]
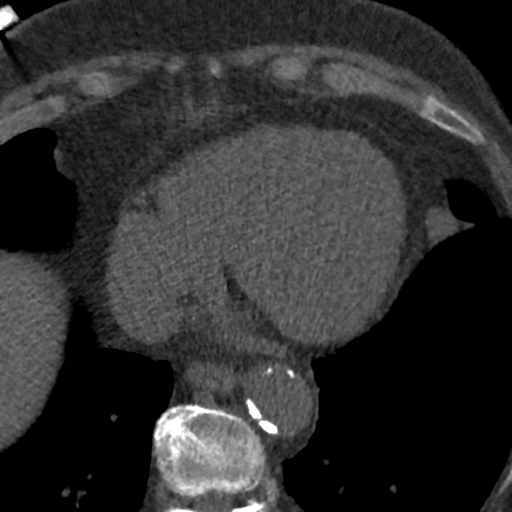
[im 17/37  vessel]
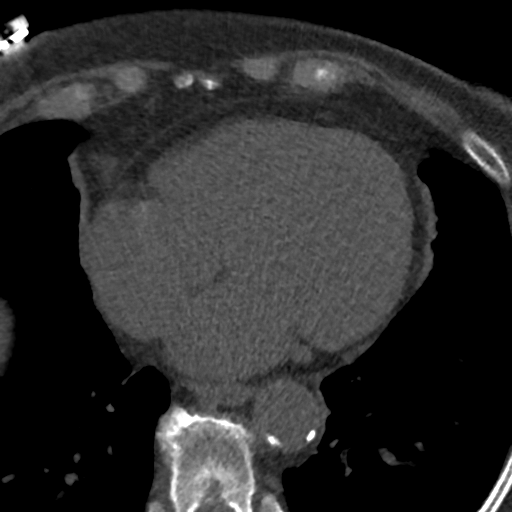
[im 21/37  vessel]
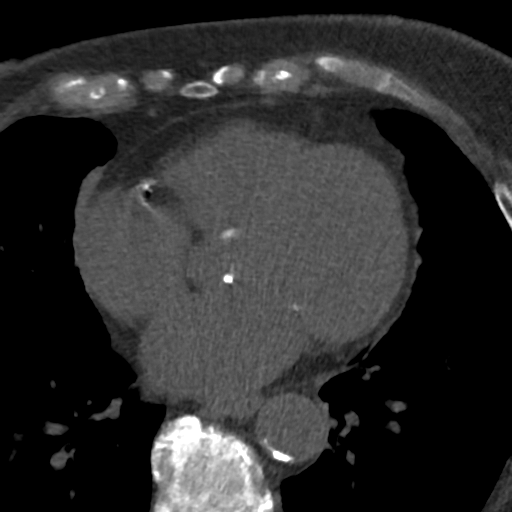
[im 21/37  lung]
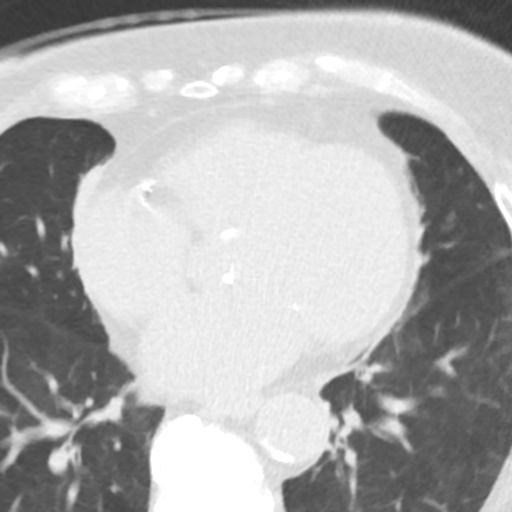
[im 25/37  vessel]
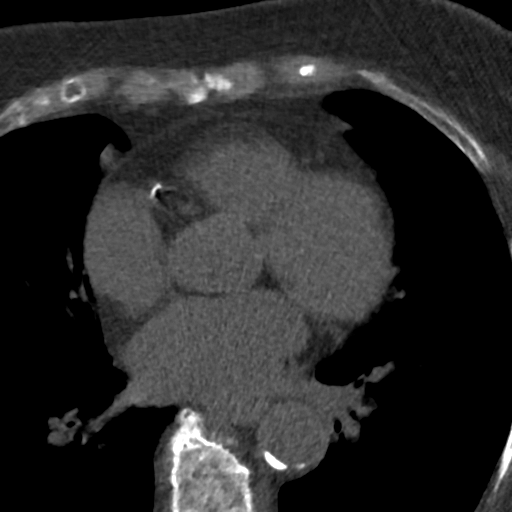
[im 29/37  vessel]
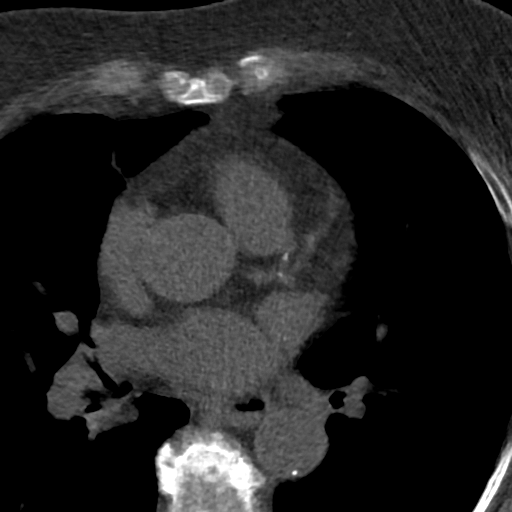
[im 33/37  vessel]
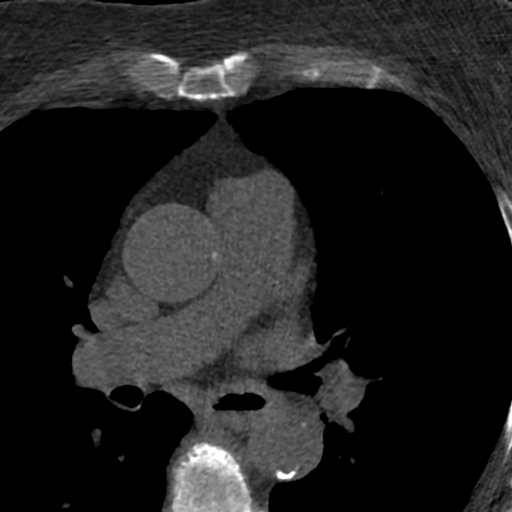

[Series 5: lung st 67 % · axial · 0.63mm/px · z∈[-226,-142]mm · 8 of 37 slices shown]
[im 5/37  lung]
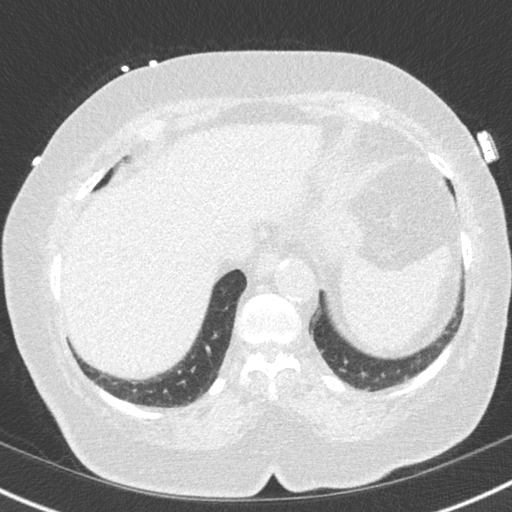
[im 9/37  lung]
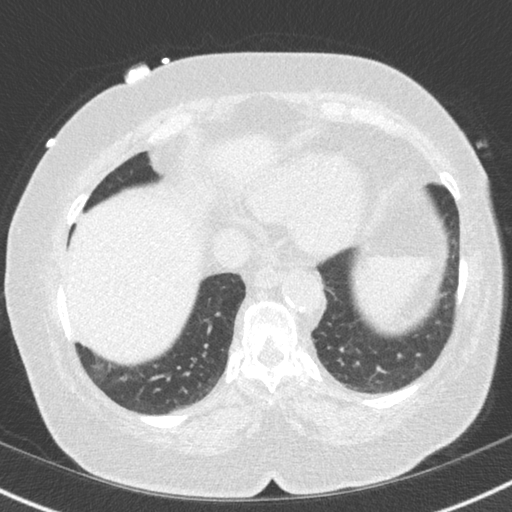
[im 13/37  lung]
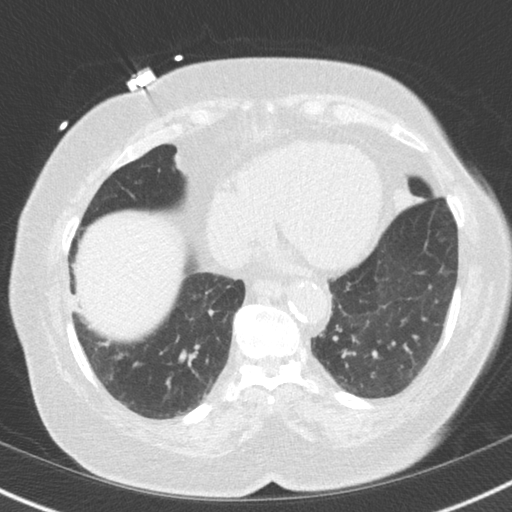
[im 17/37  lung]
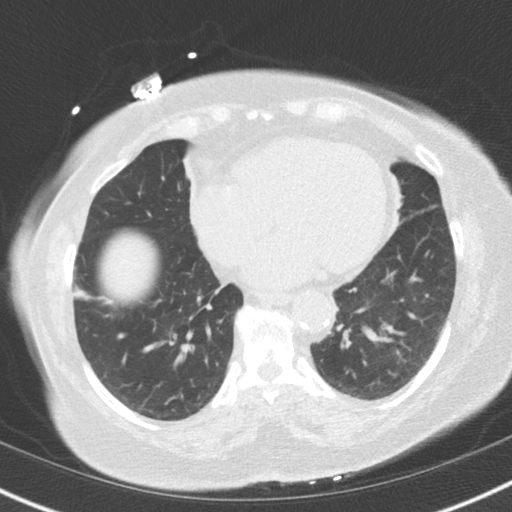
[im 21/37  lung]
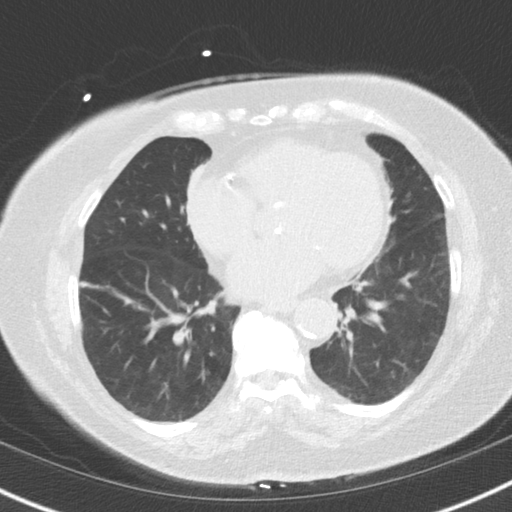
[im 25/37  lung]
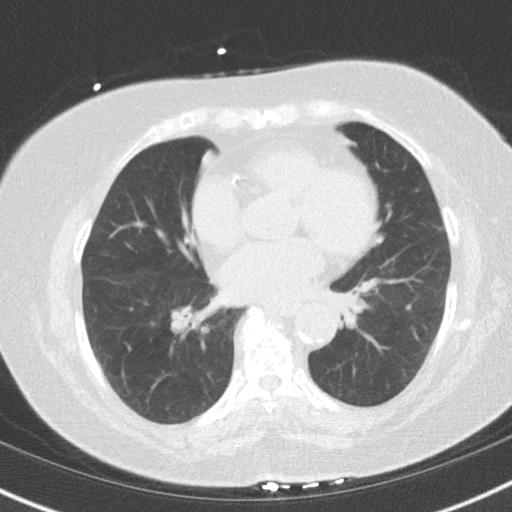
[im 29/37  lung]
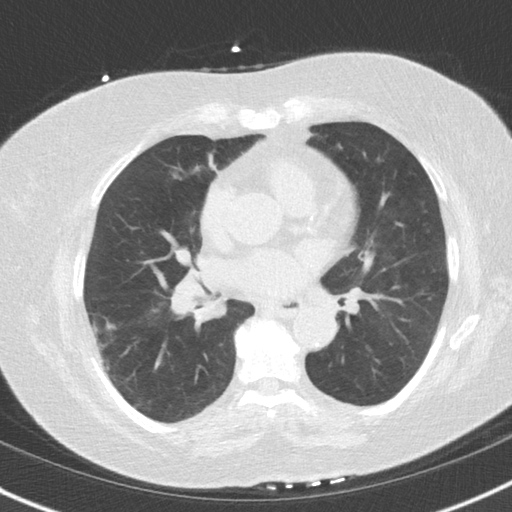
[im 33/37  lung]
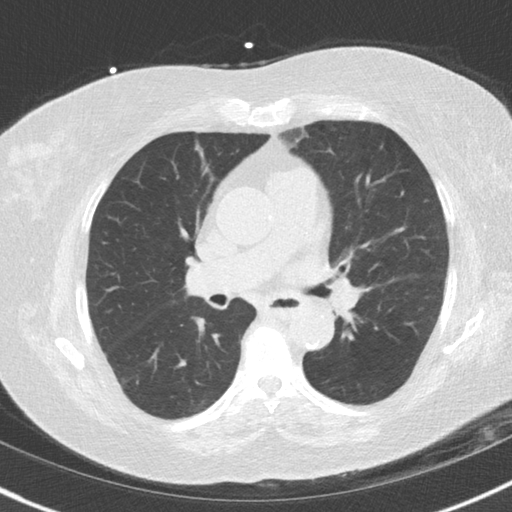

[16 of 20 positions shown; findings below may reference images not displayed]

FINDINGS: Non-cardiac: See separate report from [REDACTED].

Thoracic Aorta: Normal size, mild diffuse calcifications in the
ascending aorta and moderate calcifications in the descending aorta.

Aortic Valve: Moderate calcifications. There are also mitral annular
calcifications.

Pericardium: Normal.

Coronary arteries: Normal origin.
IMPRESSION: Coronary calcium score of 372. This was 73 percentile for age and
sex matched control.

ADDENDUM:
Aortic valve calcium score is 428.

Danzabe Freddy

EXAM:
OVER-READ INTERPRETATION CT CHEST

The following report is an over-read performed by radiologist Dr.
over-read does not include interpretation of cardiac or coronary
anatomy or pathology. The coronary calcium score interpretation by
the cardiologist is attached.
FINDINGS: Vascular: Moderate aortic valvular calcification. Moderate calcified
plaque involving the visualized thoracic aorta without evidence of
aneurysm.

Mediastinum/Nodes: Air-fluid level within the normal appearing
esophagus. No pathologic lymphadenopathy involving the visualized
mediastinum or either hilum.

Lungs/Pleura: Linear atelectasis involving the LEFT lower lobe.
Stable chronic scarring involving the RIGHT middle lobe. Visualized
lung parenchyma otherwise clear. Emphysematous changes in both
lungs.

Upper Abdomen: Visualized upper abdomen unremarkable for the
unenhanced technique.

Musculoskeletal: Degenerative disc disease and spondylosis involving
the visualized LOWER thoracic spine.
IMPRESSION: 1. Linear atelectasis involving the LEFT lower lobe. No acute
cardiopulmonary disease otherwise involving the visualized lungs.
2. Air-fluid level within the normal appearing esophagus which
likely indicates reflux and/or dysmotility.
3. Moderate aortic valvular calcification.
4. Aortic Atherosclerosis (SJ56J-CL4.4) and Emphysema (SJ56J-M0U.U).

## 2020-03-15 DIAGNOSIS — M25512 Pain in left shoulder: Secondary | ICD-10-CM | POA: Diagnosis not present

## 2020-03-15 DIAGNOSIS — M7542 Impingement syndrome of left shoulder: Secondary | ICD-10-CM | POA: Diagnosis not present

## 2020-04-01 DIAGNOSIS — J449 Chronic obstructive pulmonary disease, unspecified: Secondary | ICD-10-CM | POA: Diagnosis not present

## 2020-04-07 DIAGNOSIS — E7800 Pure hypercholesterolemia, unspecified: Secondary | ICD-10-CM | POA: Diagnosis not present

## 2020-04-07 DIAGNOSIS — E039 Hypothyroidism, unspecified: Secondary | ICD-10-CM | POA: Diagnosis not present

## 2020-04-07 DIAGNOSIS — R300 Dysuria: Secondary | ICD-10-CM | POA: Diagnosis not present

## 2020-04-07 DIAGNOSIS — M8580 Other specified disorders of bone density and structure, unspecified site: Secondary | ICD-10-CM | POA: Diagnosis not present

## 2020-04-07 DIAGNOSIS — Z6828 Body mass index (BMI) 28.0-28.9, adult: Secondary | ICD-10-CM | POA: Diagnosis not present

## 2020-04-07 DIAGNOSIS — Z79899 Other long term (current) drug therapy: Secondary | ICD-10-CM | POA: Diagnosis not present

## 2020-04-07 DIAGNOSIS — Z01419 Encounter for gynecological examination (general) (routine) without abnormal findings: Secondary | ICD-10-CM | POA: Diagnosis not present

## 2020-04-23 DIAGNOSIS — M1990 Unspecified osteoarthritis, unspecified site: Secondary | ICD-10-CM | POA: Diagnosis not present

## 2020-04-23 DIAGNOSIS — J449 Chronic obstructive pulmonary disease, unspecified: Secondary | ICD-10-CM | POA: Diagnosis not present

## 2020-04-23 DIAGNOSIS — Z7952 Long term (current) use of systemic steroids: Secondary | ICD-10-CM | POA: Diagnosis not present

## 2020-04-23 DIAGNOSIS — R112 Nausea with vomiting, unspecified: Secondary | ICD-10-CM | POA: Diagnosis not present

## 2020-04-23 DIAGNOSIS — E861 Hypovolemia: Secondary | ICD-10-CM | POA: Diagnosis not present

## 2020-04-23 DIAGNOSIS — I110 Hypertensive heart disease with heart failure: Secondary | ICD-10-CM | POA: Diagnosis not present

## 2020-04-23 DIAGNOSIS — I509 Heart failure, unspecified: Secondary | ICD-10-CM | POA: Diagnosis not present

## 2020-04-23 DIAGNOSIS — R197 Diarrhea, unspecified: Secondary | ICD-10-CM | POA: Diagnosis not present

## 2020-04-23 DIAGNOSIS — E7800 Pure hypercholesterolemia, unspecified: Secondary | ICD-10-CM | POA: Diagnosis not present

## 2020-04-29 DIAGNOSIS — J449 Chronic obstructive pulmonary disease, unspecified: Secondary | ICD-10-CM | POA: Diagnosis not present

## 2020-05-03 ENCOUNTER — Ambulatory Visit: Payer: Medicare HMO | Admitting: Cardiovascular Disease

## 2020-05-03 ENCOUNTER — Encounter: Payer: Self-pay | Admitting: Cardiovascular Disease

## 2020-05-03 ENCOUNTER — Other Ambulatory Visit: Payer: Self-pay

## 2020-05-03 ENCOUNTER — Encounter (INDEPENDENT_AMBULATORY_CARE_PROVIDER_SITE_OTHER): Payer: Self-pay

## 2020-05-03 VITALS — BP 130/68 | HR 73 | Ht 59.0 in | Wt 138.8 lb

## 2020-05-03 DIAGNOSIS — I350 Nonrheumatic aortic (valve) stenosis: Secondary | ICD-10-CM

## 2020-05-03 NOTE — Patient Instructions (Signed)
Medication Instructions:  No changes   Lab Work: none   Testing/Procedures:  Please schedule in [place] has requested that you have an echocardiogram. Echocardiography is a painless test that uses sound waves to create images of your heart. It provides your doctor with information about the size and shape of your heart and how well your heart's chambers and valves are working. This procedure takes approximately one hour. There are no restrictions for this procedure.   Follow-Up: At [facility], you and your health needs are our priority.  As part of our continuing mission to provide you with exceptional heart care, we have created designated Provider Care Teams.  These Care Teams include your primary Cardiologist (physician) and Advanced Practice Providers (APPs -  Physician Assistants and Nurse Practitioners) who all work together to provide you with the care you need, when you need it.  We recommend signing up for the patient portal called "MyChart".  Sign up information is provided on this After Visit Summary.  MyChart is used to connect with patients for Virtual Visits (Telemedicine).  Patients are able to view lab/test results, encounter notes, upcoming appointments, etc.  Non-urgent messages can be sent to your provider as well.   To learn more about what you can do with MyChart, go to [email or web address].    Your next appointment:   6 month(s)  The format for your next appointment:   In Person  Provider:   Lauree Chandler, MD  Other Instructions

## 2020-05-03 NOTE — Progress Notes (Signed)
Structural Heart Clinic Follow Up Note  Chief Complaint  Patient presents with  . Follow-up    Aortic stenosis   History of Present Illness: 87 yo female with history of COPD, CAD, prior CVA, chronic diastolic CHF, HTN, hyperlipidemia, prior tobacco abuse and carotid artery disease with prior who is here today for follow up in the structural heart clinic to discuss TAVR. I met her in October 2021 as a new consult, referred by Dr. Fraser Din, for further evaluation of her severe aortic stenosis and discussion regarding TAVR. History of carotid artery disease with prior right carotid endarterectomy.  Echo July 2021 with LVEF=55-60% with moderate concentric LVH. The aortic valve is thickened with mild calcification. Mean gradient 21.5 mmHg, AVA 0.66 cm2, dimensionless index 0.29. She was seen in the office by Dr. Harriet Masson 11/02/19 and c/o fatigue. She is felt to have paradoxical low flow/low gradient aortic stenosis. When I saw her in October 2021, she was feeling well and did not wish to move forward with TAVR at that time. She reported mild dyspnea and occasional LE edema at that time.  She is here today for follow up. The patient denies any chest pain, dyspnea, palpitations, orthopnea, PND, dizziness, near syncope or syncope. She continues to have baseline mild dyspnea. No change. Also mild LE edema at the end of the day that resolves at night.   She lives alone in [place]. She goes to the dentist regularly. She has dental implants.  She is retired from [employer] work.   Primary Care Physician: Angelina Sheriff, MD Primary Cardiologist: Agustin Cree Referring Cardiologist: Agustin Cree  Past Medical History:  Diagnosis Date  . Aortic stenosis 11/09/2014   Overview:  Mild in 2015 Moderate to severe in the October 2018  . Chronic respiratory failure with hypoxia (HCC) 08/06/2018   02 hs x 2lpm as of 08/05/2018 and prn daytime to keep sats > 90%  - 09/01/2018 no desats walking / limited by back   . COPD  (chronic obstructive pulmonary disease) ([place])   . Coronary artery disease   . CVA (cerebral vascular accident) ([place])   . Diastolic congestive heart failure ([place]) 07/31/2018  . DOE (dyspnea on exertion) 12/11/2016   Quit smoking 07/22/18 with emphysema on ct from 06/12/2018 - PFT 02/17/17    FEV1 1.35 [118%], ratio 0.82 with 14% resp to saba and TLC 93%, DLCO 73% - Echo 01/20/2018 c/w mod AS s PH - pt stopped vasotec and anoro on her own prior to pulmonary ov 08/05/2018 > rec leave off and rx for gerd x 4 weeks > improved as of 09/01/2018  - 09/01/2018   Walked RA x one lap =  approx 250 ft avg pace >> stopped due to  . Essential hypertension 08/18/2013   rec Off acei permanently as of 08/05/2018 due to pseudowheeze   . Greater trochanteric bursitis of right hip 11/13/2017  . Hyperlipemia 08/18/2013  . Hyperlipidemia   . Hypertension   . Late effects of CVA (cerebrovascular accident) 11/09/2014  . Lumbar degenerative disc disease 06/18/2013  . Lumbar spondylosis 06/18/2013  . Peripheral vascular disease, unspecified ([place]) status post right carotic endarterectomy 08/28/2018  . Sacroiliac inflammation ([place]) 11/13/2017  . Sacroiliitis ([place]) 11/13/2017  . Smoking 05/23/2015  . Spinal stenosis of lumbar region with neurogenic claudication 08/18/2013  . Stroke ([place]) 09/08/2013  . Thoracic aortic atherosclerosis [facility])     Past Surgical History:  Procedure Laterality Date  . CAROTID ENDARTERECTOMY Bilateral   . NECK SURGERY  Current Outpatient Medications  Medication Sig Dispense Refill  . albuterol (VENTOLIN HFA) 108 (90 Base) MCG/ACT inhaler Inhale 1 puff into the lungs as needed.    Marland Kitchen amLODipine (NORVASC) 5 MG tablet Take 5 mg daily by mouth.    Marland Kitchen atorvastatin (LIPITOR) 40 MG tablet TAKE 1 TABLET BY MOUTH EVERY DAY 90 tablet 0  . chlorhexidine (PERIDEX) 0.12 % solution Use as directed    . clopidogrel (PLAVIX) 75 MG tablet Take 75 mg by mouth daily.     . famotidine (PEPCID) 20 MG tablet One after  supper 30 tablet 11  . furosemide (LASIX) 40 MG tablet Take 40 mg daily by mouth.    Marland Kitchen ipratropium-albuterol (DUONEB) 0.5-2.5 (3) MG/3ML SOLN Take 3 mLs by nebulization every 4 (four) hours as needed.    Marland Kitchen levothyroxine (SYNTHROID, LEVOTHROID) 50 MCG tablet Take 1 tablet daily by mouth.    . metoprolol tartrate (LOPRESSOR) 25 MG tablet Take 1 tablet (25 mg total) by mouth 2 (two) times daily. 180 tablet 3  . OXYGEN 2lpm as needed    . pantoprazole (PROTONIX) 40 MG tablet TAKE 1 TABLET BY MOUTH IN THE MORNING, TAKE 30-60 MINUTES BEFORE FIRST MEAL OF THE DAY 30 tablet 0  . potassium chloride (KLOR-CON) 10 MEQ tablet Take 1 tablet (10 mEq total) by mouth 2 (two) times daily. 180 tablet 1  . Vitamin D, Ergocalciferol, (DRISDOL) 1.25 MG (50000 UT) CAPS capsule Take 50,000 Units by mouth every 7 (seven) days.     No current facility-administered medications for this visit.    Allergies  Allergen Reactions  . Codeine Hives and Nausea Only    Social History   Socioeconomic History  . Marital status: Widowed    Spouse name: Not on file  . Number of children: 8  . Years of education: Not on file  . Highest education level: Not on file  Occupational History  . 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 Smoking status: Current Every Day Smoker    Packs/day: 1.00    Years: 35.00    Pack years: 35.00    Types: Cigarettes    Last attempt to quit: 07/22/2018    Years since quitting: 1.7  . Smokeless tobacco: Never Used  Vaping Use  . Vaping Use: Former  Substance and Sexual Activity  . Alcohol use: No  . Drug use: No  . Sexual activity: Not on file  Other Topics Concern  . Not on file  Social History Narrative  . Not on file   Social Determinants of Health   Financial Resource Strain: Not on file  Food Insecurity: Not on file  Transportation Needs: Not on file  Physical Activity: Not on file  Stress: Not on file  Social Connections: Not on file  Intimate  Partner Violence: Not on file    Family History  Problem Relation Age of Onset  . Heart disease Mother   . Hypertension Mother   . Heart disease Father     Review of Systems:  As stated in the HPI and otherwise negative.   BP 130/68   Pulse 73   Ht '4\' 11"'  (1.499 m)   Wt 138 lb 12.8 oz (63 kg)   SpO2 97%   BMI 28.03 kg/m   Physical Examination:  General: Well developed, well nourished, NAD  HEENT: OP clear, mucus membranes moist  SKIN: warm, dry. No rashes. Neuro: No focal deficits  Musculoskeletal: Muscle strength 5/5 all  ext  Psychiatric: Mood and affect normal  Neck: No JVD, no carotid bruits, no thyromegaly, no lymphadenopathy.  Lungs:Clear bilaterally, no wheezes, rhonci, crackles Cardiovascular: Regular rate and rhythm. Loud, harsh systolic murmur.  Abdomen:Soft. Bowel sounds present. Non-tender.  Extremities: No lower extremity edema. Pulses are 2 + in the bilateral DP/PT.  EKG:  EKG is not ordered today. The ekg ordered today demonstrates   Echo 08/19/19: 1. Left ventricular ejection fraction, by estimation, is 55 to 60%. The  left ventricle has normal function. The left ventricle has no regional  wall motion abnormalities. There is moderate concentric left ventricular  hypertrophy. Left ventricular  diastolic parameters are consistent with Grade II diastolic dysfunction  (pseudonormalization).  2. Right ventricular systolic function is normal. The right ventricular  size is normal. There is normal pulmonary artery systolic pressure.  3. There is mild posterior mitral annular calcification. No evidence of  mitral valve regurgitation. Mild mitral stenosis. The mean mitral valve  gradient is 3.0 mmHg.  4. The aortic valve is thickened and mildly calcified. Aortic valve  regurgitation is not visualized. Low flow low gradient moderate to severe  aortic valve stenosis. Aortic valve area, by VTI measures 0.66 cm. Aortic  valve mean gradient measures 21.5   mmHg. Aortic valve Vmax measures 3.08 m/s.  5. There is mild dilatation of the ascending aorta measuring 37 mm.  6. The inferior vena cava is normal in size with greater than 50%  respiratory variability, suggesting right atrial pressure of 3 mmHg.   FINDINGS  Left Ventricle: Left ventricular ejection fraction, by estimation, is 55  to 60%. The left ventricle has normal function. The left ventricle has no  regional wall motion abnormalities. The left ventricular internal cavity  size was normal in size. There is  moderate concentric left ventricular hypertrophy. Left ventricular  diastolic parameters are consistent with Grade II diastolic dysfunction  (pseudonormalization).   Right Ventricle: The right ventricular size is normal. No increase in  right ventricular wall thickness. Right ventricular systolic function is  normal. There is normal pulmonary artery systolic pressure. The tricuspid  regurgitant velocity is 2.61 m/s, and  with an assumed right atrial pressure of 3 mmHg, the estimated right  ventricular systolic pressure is 16.1 mmHg.   Left Atrium: Left atrial size was normal in size.   Right Atrium: Right atrial size was normal in size.   Pericardium: There is no evidence of pericardial effusion. Presence of  pericardial fat pad.   Mitral Valve: The mitral valve is abnormal. Normal mobility of the mitral  valve leaflets. Mild mitral annular calcification. No evidence of mitral  valve regurgitation. Mild mitral valve stenosis. MV peak gradient, 7.3  mmHg. The mean mitral valve gradient  is 3.0 mmHg.   Tricuspid Valve: The tricuspid valve is normal in structure. Tricuspid  valve regurgitation is trivial. No evidence of tricuspid stenosis.   Aortic Valve: The aortic valve is abnormal. . There is mild thickening and  mild calcification of the aortic valve. Aortic valve regurgitation is not  visualized. Moderate to severe aortic stenosis is present. Mild aortic   valve annular calcification. There  is mild thickening of the aortic valve. There is mild calcification of  the aortic valve. Aortic valve mean gradient measures 21.5 mmHg. Aortic  valve peak gradient measures 37.9 mmHg. Aortic valve area, by VTI measures  0.66 cm.   Pulmonic Valve: The pulmonic valve was normal in structure. Pulmonic valve  regurgitation is not visualized. No evidence of  pulmonic stenosis.   Aorta: Aortic dilatation noted. There is mild dilatation of the ascending  aorta measuring 37 mm.   Venous: The inferior vena cava is normal in size with greater than 50%  respiratory variability, suggesting right atrial pressure of 3 mmHg.   IAS/Shunts: No atrial level shunt detected by color flow Doppler.     LEFT VENTRICLE  PLAX 2D  LVIDd:     3.40 cm Diastology  LVIDs:     2.50 cm LV e' lateral:  5.19 cm/s  LV PW:     1.40 cm LV E/e' lateral: 22.9  LV IVS:    1.40 cm LV e' medial:  3.73 cm/s  LVOT diam:   1.70 cm LV E/e' medial: 31.9  LV SV:     50  LV SV Index:  32  LVOT Area:   2.27 cm     RIGHT VENTRICLE       IVC  RV S prime:   10.90 cm/s IVC diam: 0.90 cm  TAPSE (M-mode): 2.3 cm   LEFT ATRIUM       Index    RIGHT ATRIUM      Index  LA diam:    3.30 cm 2.09 cm/m RA Area:   12.80 cm  LA Vol (A2C):  39.3 ml 24.87 ml/m RA Volume:  26.80 ml 16.96 ml/m  LA Vol (A4C):  43.8 ml 27.72 ml/m  LA Biplane Vol: 44.1 ml 27.91 ml/m  AORTIC VALVE  AV Area (Vmax):  0.67 cm  AV Area (Vmean):  0.71 cm  AV Area (VTI):   0.66 cm  AV Vmax:      308.00 cm/s  AV Vmean:     221.500 cm/s  AV VTI:      0.764 m  AV Peak Grad:   37.9 mmHg  AV Mean Grad:   21.5 mmHg  LVOT Vmax:     91.00 cm/s  LVOT Vmean:    69.300 cm/s  LVOT VTI:     0.221 m  LVOT/AV VTI ratio: 0.29    AORTA  Ao Root diam: 2.90 cm  Ao Asc diam: 3.70 cm   MITRAL VALVE         TRICUSPID VALVE  MV Area (PHT): 2.32 cm   TR Peak grad:  27.2 mmHg  MV Peak grad: 7.3 mmHg   TR Vmax:    261.00 cm/s  MV Mean grad: 3.0 mmHg  MV Vmax:    1.35 m/s   SHUNTS  MV Vmean:   87.0 cm/s  Systemic VTI: 0.22 m  MV Decel Time: 327 msec   Systemic Diam: 1.70 cm  MV E velocity: 119.00 cm/s  MV A velocity: 119.00 cm/s  MV E/A ratio: 1.00   Recent Labs: 11/02/2019: BNP 87.6; BUN 19; Creatinine, Ser 1.10; Potassium 3.9; Sodium 144    Wt Readings from Last 3 Encounters:  05/03/20 138 lb 12.8 oz (63 kg)  02/07/20 142 lb (64.4 kg)  11/22/19 138 lb 12.8 oz (63 kg)    STS Risk Score: Risk of Mortality: 5.876% Renal Failure: 2.760% Permanent Stroke: 2.770% Prolonged Ventilation: 15.408% DSW Infection: 0.105% Reoperation: 3.159% Morbidity or Mortality: 19.718% Short Length of Stay: 14.977% Long Length of Stay: 12.444%   Assessment and Plan:   1. Severe Aortic Valve Stenosis: She likely has severe aortic stenosis by echo in July 2021. The aortic valve is thickened and calcified with restricted leaflet excursion. I have personally reviewed the echo images. This is mostly likely a paradoxical low  flow, low gradient AS. The AVA is below 0.8 cm2 with mean gradient in the low 20s and dimensionless index around 0.29. She was asymptomatic at her first visit here in October 2021. She continues to have mild dyspnea that is unchanged over the past few years. She is reluctant to consider having any procedures. Given advanced age, she is not a good candidate for conventional AVR by surgical approach. I think she may be a good candidate for TAVR.   I have reviewed the natural history of aortic stenosis with the patient and their family members  who are present today. We have discussed the limitations of medical therapy and the poor prognosis associated with symptomatic aortic stenosis. We have reviewed potential treatment options, including palliative medical  therapy, conventional surgical aortic valve replacement, and transcatheter aortic valve replacement. We discussed treatment options in the context of the patient's specific comorbid medical conditions.   I will plan to repeat an echo now. Will review echo with her. Follow up here in 6 months. She will call with change in her symptoms.      Current medicines are reviewed at length with the patient today.  The patient does not have concerns regarding medicines.  The following changes have been made:  no change  Labs/ tests ordered today include:   Orders Placed This Encounter  Procedures  . ECHOCARDIOGRAM COMPLETE     Disposition:   FU with me in 6 months.    Signed, Lauree Chandler, MD 05/03/2020 4:03 PM    [facility] [address], [place], [place]  [address] Phone: [phone]; Fax: [phone]

## 2020-05-19 ENCOUNTER — Other Ambulatory Visit: Payer: Self-pay | Admitting: Cardiology

## 2020-05-19 NOTE — Telephone Encounter (Signed)
Atorvastatin approved and sent 

## 2020-05-26 DIAGNOSIS — N3090 Cystitis, unspecified without hematuria: Secondary | ICD-10-CM | POA: Diagnosis not present

## 2020-05-30 DIAGNOSIS — J449 Chronic obstructive pulmonary disease, unspecified: Secondary | ICD-10-CM | POA: Diagnosis not present

## 2020-06-02 ENCOUNTER — Other Ambulatory Visit: Payer: Self-pay

## 2020-06-09 ENCOUNTER — Encounter: Payer: Self-pay | Admitting: Cardiology

## 2020-06-09 ENCOUNTER — Ambulatory Visit (INDEPENDENT_AMBULATORY_CARE_PROVIDER_SITE_OTHER): Payer: Medicare HMO

## 2020-06-09 ENCOUNTER — Ambulatory Visit: Payer: Medicare HMO | Admitting: Cardiology

## 2020-06-09 ENCOUNTER — Other Ambulatory Visit: Payer: Self-pay

## 2020-06-09 VITALS — BP 130/50 | HR 68 | Ht 59.0 in | Wt 144.0 lb

## 2020-06-09 DIAGNOSIS — I10 Essential (primary) hypertension: Secondary | ICD-10-CM | POA: Diagnosis not present

## 2020-06-09 DIAGNOSIS — E782 Mixed hyperlipidemia: Secondary | ICD-10-CM | POA: Diagnosis not present

## 2020-06-09 DIAGNOSIS — I350 Nonrheumatic aortic (valve) stenosis: Secondary | ICD-10-CM

## 2020-06-09 DIAGNOSIS — I6990 Unspecified sequelae of unspecified cerebrovascular disease: Secondary | ICD-10-CM

## 2020-06-09 DIAGNOSIS — I5032 Chronic diastolic (congestive) heart failure: Secondary | ICD-10-CM | POA: Diagnosis not present

## 2020-06-09 LAB — ECHOCARDIOGRAM COMPLETE
AR max vel: 0.97 cm2
AV Area VTI: 1.02 cm2
AV Area mean vel: 1.01 cm2
AV Mean grad: 21 mmHg
AV Peak grad: 38.1 mmHg
Ao pk vel: 3.09 m/s
Area-P 1/2: 3.12 cm2
S' Lateral: 2.4 cm

## 2020-06-09 MED ORDER — AMLODIPINE BESYLATE 2.5 MG PO TABS: 2.5000 mg | ORAL_TABLET | Freq: Every day | ORAL | 1 refills | Status: DC

## 2020-06-09 NOTE — Progress Notes (Signed)
Complete echocardiogram performed.  Jimmy Mikiala Fugett RDCS, RVT  

## 2020-06-09 NOTE — Patient Instructions (Signed)
Medication Instructions:  Your physician has recommended you make the following change in your medication:   DECREASE: Amlodipine 2.5 mg daily   *If you need a refill on your cardiac medications before your next appointment, please call your pharmacy*   Lab Work: None If you have labs (blood work) drawn today and your tests are completely normal, you will receive your results only by: Marland Kitchen MyChart Message (if you have MyChart) OR . A paper copy in the mail If you have any lab test that is abnormal or we need to change your treatment, we will call you to review the results.   Testing/Procedures: None   Follow-Up: At [facility], you and your health needs are our priority.  As part of our continuing mission to provide you with exceptional heart care, we have created designated Provider Care Teams.  These Care Teams include your primary Cardiologist (physician) and Advanced Practice Providers (APPs -  Physician Assistants and Nurse Practitioners) who all work together to provide you with the care you need, when you need it.  We recommend signing up for the patient portal called "MyChart".  Sign up information is provided on this After Visit Summary.  MyChart is used to connect with patients for Virtual Visits (Telemedicine).  Patients are able to view lab/test results, encounter notes, upcoming appointments, etc.  Non-urgent messages can be sent to your provider as well.   To learn more about what you can do with MyChart, go to [email or web address].    Your next appointment:   4 month(s)  The format for your next appointment:   In Person  Provider:   Jenne Campus, MD   Other Instructions

## 2020-06-09 NOTE — Progress Notes (Signed)
Cardiology Office Note:    Date:  06/09/2020   ID:  Annette Huang, DOB Aug 16, 1927, MRN [id number]  PCP:  Angelina Sheriff, MD  Cardiologist:  Jenne Campus, MD    Referring MD: Angelina Sheriff, MD   Chief Complaint  Patient presents with  . Follow-up    Echo done today  I am doing fine  History of Present Illness:    Annette Huang is a 87 y.o. female with past medical history significant for moderate to severe aortic stenosis which is low-flow low gradient, COPD, smoker which is still ongoing, peripheral vascular disease in form of carotic arterial disease, essential hypertension, dyslipidemia.  She comes today 2 months for follow-up overall she says she is doing well she describes still have shortness of breath but not worse than before.  Denies having a dizziness or passing out, there is no chest pain tightness squeezing pressure burning chest.  Today she had echocardiogram done but official report is not available yet.  Past Medical History:  Diagnosis Date  . Aortic stenosis 11/09/2014   Overview:  Mild in 2015 Moderate to severe in the October 2018  . Chronic respiratory failure with hypoxia (HCC) 08/06/2018   02 hs x 2lpm as of 08/05/2018 and prn daytime to keep sats > 90%  - 09/01/2018 no desats walking / limited by back   . COPD (chronic obstructive pulmonary disease) ([place])   . Coronary artery disease   . CVA (cerebral vascular accident) ([place])   . Diastolic congestive heart failure ([place]) 07/31/2018  . DOE (dyspnea on exertion) 12/11/2016   Quit smoking 07/22/18 with emphysema on ct from 06/12/2018 - PFT 02/17/17    FEV1 1.35 [118%], ratio 0.82 with 14% resp to saba and TLC 93%, DLCO 73% - Echo 01/20/2018 c/w mod AS s PH - pt stopped vasotec and anoro on her own prior to pulmonary ov 08/05/2018 > rec leave off and rx for gerd x 4 weeks > improved as of 09/01/2018  - 09/01/2018   Walked RA x one lap =  approx 250 ft avg pace >> stopped due to  . Essential hypertension 08/18/2013    rec Off acei permanently as of 08/05/2018 due to pseudowheeze   . Greater trochanteric bursitis of right hip 11/13/2017  . Hyperlipemia 08/18/2013  . Hyperlipidemia   . Hypertension   . Late effects of CVA (cerebrovascular accident) 11/09/2014  . Lumbar degenerative disc disease 06/18/2013  . Lumbar spondylosis 06/18/2013  . Peripheral vascular disease, unspecified ([place]) status post right carotic endarterectomy 08/28/2018  . Sacroiliac inflammation ([place]) 11/13/2017  . Sacroiliitis ([place]) 11/13/2017  . Smoking 05/23/2015  . Spinal stenosis of lumbar region with neurogenic claudication 08/18/2013  . Stroke ([place]) 09/08/2013  . Thoracic aortic atherosclerosis [facility])     Past Surgical History:  Procedure Laterality Date  . CAROTID ENDARTERECTOMY Bilateral   . NECK SURGERY      Current Medications: Current Meds  Medication Sig  . albuterol (VENTOLIN HFA) 108 (90 Base) MCG/ACT inhaler Inhale 1 puff into the lungs as needed for wheezing or shortness of breath.  Marland Kitchen amLODipine (NORVASC) 5 MG tablet Take 5 mg daily by mouth.  Marland Kitchen atorvastatin (LIPITOR) 40 MG tablet TAKE 1 TABLET BY MOUTH EVERY DAY (Patient taking differently: Take 40 mg by mouth at bedtime.)  . clopidogrel (PLAVIX) 75 MG tablet Take 75 mg by mouth daily.   . Cyanocobalamin (VITAMIN B12 PO) Take 1 tablet by mouth daily. Unknown strenght  .  famotidine (PEPCID) 20 MG tablet One after supper (Patient taking differently: Take 20 mg by mouth as needed for heartburn or indigestion.)  . furosemide (LASIX) 40 MG tablet Take 40 mg daily by mouth.  Marland Kitchen ipratropium-albuterol (DUONEB) 0.5-2.5 (3) MG/3ML SOLN Take 3 mLs by nebulization every 4 (four) hours as needed (Wheezing  and SOB).  Marland Kitchen levothyroxine (SYNTHROID, LEVOTHROID) 50 MCG tablet Take 1 tablet daily by mouth.  . metoprolol tartrate (LOPRESSOR) 25 MG tablet Take 1 tablet (25 mg total) by mouth 2 (two) times daily.  . OXYGEN Inhale 2 L into the lungs as needed (Mainly at bedtime). 2lpm as  needed  . potassium chloride (KLOR-CON) 10 MEQ tablet Take 1 tablet (10 mEq total) by mouth 2 (two) times daily.  . Vitamin D, Ergocalciferol, (DRISDOL) 1.25 MG (50000 UT) CAPS capsule Take 50,000 Units by mouth 2 (two) times a week.     Allergies:   Codeine   Social History   Socioeconomic History  . Marital status: Widowed    Spouse name: Not on file  . Number of children: 8  . Years of education: Not on file  . Highest education level: Not on file  Occupational History  . 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 Smoking status: Current Every Day Smoker    Packs/day: 1.00    Years: 35.00    Pack years: 35.00    Types: Cigarettes    Last attempt to quit: 07/22/2018    Years since quitting: 1.8  . Smokeless tobacco: Never Used  Vaping Use  . Vaping Use: Former  Substance and Sexual Activity  . Alcohol use: No  . Drug use: No  . Sexual activity: Not on file  Other Topics Concern  . Not on file  Social History Narrative  . Not on file   Social Determinants of Health   Financial Resource Strain: Not on file  Food Insecurity: Not on file  Transportation Needs: Not on file  Physical Activity: Not on file  Stress: Not on file  Social Connections: Not on file     Family History: The patient's family history includes Heart disease in her father and mother; Hypertension in her mother. ROS:   Please see the history of present illness.    All 14 point review of systems negative except as described per history of present illness  EKGs/Labs/Other Studies Reviewed:      Recent Labs: 11/02/2019: BNP 87.6; BUN 19; Creatinine, Ser 1.10; Potassium 3.9; Sodium 144  Recent Lipid Panel    Component Value Date/Time   CHOL 132 09/14/2019 1006   TRIG 59 09/14/2019 1006   HDL 54 09/14/2019 1006   CHOLHDL 2.4 09/14/2019 1006   LDLCALC 65 09/14/2019 1006    Physical Exam:    VS:  BP (!) 130/50 (BP Location: Left Arm, Patient Position: Sitting)    Pulse 68   Ht 4\' 11"  (1.499 m)   Wt 144 lb (65.3 kg)   SpO2 94%   BMI 29.08 kg/m     Wt Readings from Last 3 Encounters:  06/09/20 144 lb (65.3 kg)  05/03/20 138 lb 12.8 oz (63 kg)  02/07/20 142 lb (64.4 kg)     GEN:  Well nourished, well developed in no acute distress HEENT: Normal NECK: No JVD; No carotid bruits LYMPHATICS: No lymphadenopathy CARDIAC: RRR, systolic ejection murmur grade 2/6 to 3/6 best heard right upper portion of the sternum, S2 is soft but still present, no  rubs, no gallops RESPIRATORY:  Clear to auscultation without rales, wheezing or rhonchi  ABDOMEN: Soft, non-tender, non-distended MUSCULOSKELETAL:  No edema; No deformity  SKIN: Warm and dry LOWER EXTREMITIES: no swelling NEUROLOGIC:  Alert and oriented x 3 PSYCHIATRIC:  Normal affect   ASSESSMENT:    1. Nonrheumatic aortic valve stenosis   2. Chronic diastolic congestive heart failure ([place])   3. Essential hypertension   4. Mixed hyperlipidemia   5. Late effects of CVA (cerebrovascular accident)    PLAN:    In order of problems listed above:  1. Aortic stenosis which appears to be moderate to severe, she does not have new symptoms with concern is been going to the fact that she does have some swelling of lower extremities.  I will cut down her amlodipine to only 2.5 mg daily, will continue rest of her medication again I reviewed signs and symptoms of severe arctic stenosis which is worsening shortness of breath, dizziness passing out, chest pain.  And I told her to let me know if she develops those.  Obviously will review her echocardiogram today and make final recommendation. 2. Chronic diastolic congestive heart failure she does have some swelling of lower extremities which may be partially related to amlodipine.  We will cut down amlodipine in half. 3. Essential hypertension blood pressure well controlled we will continue present management. 4. Dyslipidemia, her LDL is 65 and HDL 61.  Good  cholesterol profile.  We will continue present management which include high intensity statin form of Lipitor 4 mg daily.   Medication Adjustments/Labs and Tests Ordered: Current medicines are reviewed at length with the patient today.  Concerns regarding medicines are outlined above.  No orders of the defined types were placed in this encounter.  Medication changes: No orders of the defined types were placed in this encounter.   Signed, Park Liter, MD, [facility] 06/09/2020 1:00 PM    [place]

## 2020-06-20 ENCOUNTER — Telehealth: Payer: Self-pay | Admitting: Cardiology

## 2020-06-20 NOTE — Telephone Encounter (Signed)
Tried to call patient back no answer.

## 2020-06-20 NOTE — Telephone Encounter (Signed)
Patient calling for echo results 

## 2020-06-22 NOTE — Telephone Encounter (Signed)
Called patient back to give results of echo and went to voice mail and left a message to call back.

## 2020-06-23 ENCOUNTER — Telehealth: Payer: Self-pay | Admitting: *Deleted

## 2020-06-23 NOTE — Telephone Encounter (Signed)
Spoke with patient and went over results of echo,  Her heart remains strong. Her aortic stenosis remains moderate at this time. She was seen last week by Dr. Raliegh Ip. No change in symptoms. Will cc Dr. Raliegh Ip.

## 2020-06-29 DIAGNOSIS — J449 Chronic obstructive pulmonary disease, unspecified: Secondary | ICD-10-CM | POA: Diagnosis not present

## 2020-07-07 ENCOUNTER — Other Ambulatory Visit: Payer: Self-pay | Admitting: Internal Medicine

## 2020-07-07 DIAGNOSIS — R0609 Other forms of dyspnea: Secondary | ICD-10-CM

## 2020-07-07 DIAGNOSIS — R0600 Dyspnea, unspecified: Secondary | ICD-10-CM

## 2020-07-12 NOTE — Telephone Encounter (Signed)
Left detailed message of results on VM per DPR.

## 2020-07-18 ENCOUNTER — Telehealth: Payer: Self-pay | Admitting: Internal Medicine

## 2020-07-18 NOTE — Telephone Encounter (Signed)
Call returned to pharmacy, confirmed patient info, made aware patient has not been seen since 2020 so she will need to contact office for an appt. Voiced understanding.   LM with patient to make aware as well.   Nothing further needed at this time.

## 2020-07-30 DIAGNOSIS — J449 Chronic obstructive pulmonary disease, unspecified: Secondary | ICD-10-CM | POA: Diagnosis not present

## 2020-08-18 ENCOUNTER — Other Ambulatory Visit: Payer: Self-pay | Admitting: Cardiology

## 2020-08-29 DIAGNOSIS — J449 Chronic obstructive pulmonary disease, unspecified: Secondary | ICD-10-CM | POA: Diagnosis not present

## 2020-08-31 DIAGNOSIS — Z6829 Body mass index (BMI) 29.0-29.9, adult: Secondary | ICD-10-CM | POA: Diagnosis not present

## 2020-08-31 DIAGNOSIS — N3001 Acute cystitis with hematuria: Secondary | ICD-10-CM | POA: Diagnosis not present

## 2020-09-04 ENCOUNTER — Other Ambulatory Visit: Payer: Self-pay | Admitting: Cardiology

## 2020-09-22 DIAGNOSIS — Z1331 Encounter for screening for depression: Secondary | ICD-10-CM | POA: Diagnosis not present

## 2020-09-22 DIAGNOSIS — N390 Urinary tract infection, site not specified: Secondary | ICD-10-CM | POA: Diagnosis not present

## 2020-09-22 DIAGNOSIS — Z0000 Encounter for general adult medical examination without abnormal findings: Secondary | ICD-10-CM | POA: Diagnosis not present

## 2020-09-22 DIAGNOSIS — Z6829 Body mass index (BMI) 29.0-29.9, adult: Secondary | ICD-10-CM | POA: Diagnosis not present

## 2020-09-22 DIAGNOSIS — I10 Essential (primary) hypertension: Secondary | ICD-10-CM | POA: Diagnosis not present

## 2020-09-29 DIAGNOSIS — J449 Chronic obstructive pulmonary disease, unspecified: Secondary | ICD-10-CM | POA: Diagnosis not present

## 2020-10-12 ENCOUNTER — Other Ambulatory Visit: Payer: Self-pay

## 2020-10-13 ENCOUNTER — Encounter: Payer: Self-pay | Admitting: Cardiology

## 2020-10-13 ENCOUNTER — Ambulatory Visit: Payer: Medicare HMO | Admitting: Cardiology

## 2020-10-13 ENCOUNTER — Other Ambulatory Visit: Payer: Self-pay

## 2020-10-13 VITALS — BP 122/74 | HR 60 | Ht 59.0 in | Wt 142.6 lb

## 2020-10-13 DIAGNOSIS — I700 Atherosclerosis of aorta: Secondary | ICD-10-CM

## 2020-10-13 DIAGNOSIS — I2510 Atherosclerotic heart disease of native coronary artery without angina pectoris: Secondary | ICD-10-CM | POA: Diagnosis not present

## 2020-10-13 DIAGNOSIS — I350 Nonrheumatic aortic (valve) stenosis: Secondary | ICD-10-CM | POA: Diagnosis not present

## 2020-10-13 DIAGNOSIS — E782 Mixed hyperlipidemia: Secondary | ICD-10-CM

## 2020-10-13 DIAGNOSIS — I6990 Unspecified sequelae of unspecified cerebrovascular disease: Secondary | ICD-10-CM

## 2020-10-13 DIAGNOSIS — F17200 Nicotine dependence, unspecified, uncomplicated: Secondary | ICD-10-CM | POA: Diagnosis not present

## 2020-10-13 NOTE — Patient Instructions (Signed)
Medication Instructions:  No medication changes. *If you need a refill on your cardiac medications before your next appointment, please call your pharmacy*   Lab Work: None ordered If you have labs (blood work) drawn today and your tests are completely normal, you will receive your results only by: MyChart Message (if you have MyChart) OR A paper copy in the mail If you have any lab test that is abnormal or we need to change your treatment, we will call you to review the results.   Testing/Procedures: Your physician has requested that you have an echocardiogram. Echocardiography is a painless test that uses sound waves to create images of your heart. It provides your doctor with information about the size and shape of your heart and how well your heart's chambers and valves are working. This procedure takes approximately one hour. There are no restrictions for this procedure.    Follow-Up: At [facility], you and your health needs are our priority.  As part of our continuing mission to provide you with exceptional heart care, we have created designated Provider Care Teams.  These Care Teams include your primary Cardiologist (physician) and Advanced Practice Providers (APPs -  Physician Assistants and Nurse Practitioners) who all work together to provide you with the care you need, when you need it.  We recommend signing up for the patient portal called "MyChart".  Sign up information is provided on this After Visit Summary.  MyChart is used to connect with patients for Virtual Visits (Telemedicine).  Patients are able to view lab/test results, encounter notes, upcoming appointments, etc.  Non-urgent messages can be sent to your provider as well.   To learn more about what you can do with MyChart, go to [email or web address].    Your next appointment:   6 month(s)  The format for your next appointment:   In Person  Provider:   Rajan Revankar, MD   Other  Instructions Echocardiogram An echocardiogram is a test that uses sound waves (ultrasound) to produce images of the heart. Images from an echocardiogram can provide important information about: Heart size and shape. The size and thickness and movement of your heart's walls. Heart muscle function and strength. Heart valve function or if you have stenosis. Stenosis is when the heart valves are too narrow. If blood is flowing backward through the heart valves (regurgitation). A tumor or infectious growth around the heart valves. Areas of heart muscle that are not working well because of poor blood flow or injury from a heart attack. Aneurysm detection. An aneurysm is a weak or damaged part of an artery wall. The wall bulges out from the normal force of blood pumping through the body. Tell a health care provider about: Any allergies you have. All medicines you are taking, including vitamins, herbs, eye drops, creams, and over-the-counter medicines. Any blood disorders you have. Any surgeries you have had. Any medical conditions you have. Whether you are pregnant or may be pregnant. What are the risks? Generally, this is a safe test. However, problems may occur, including an allergic reaction to dye (contrast) that may be used during the test. What happens before the test? No specific preparation is needed. You may eat and drink normally. What happens during the test? You will take off your clothes from the waist up and put on a hospital gown. Electrodes or electrocardiogram (ECG)patches may be placed on your chest. The electrodes or patches are then connected to a device that monitors your heart rate and rhythm. You will   lie down on a table for an ultrasound exam. A gel will be applied to your chest to help sound waves pass through your skin. A handheld device, called a transducer, will be pressed against your chest and moved over your heart. The transducer produces sound waves that travel to  your heart and bounce back (or "echo" back) to the transducer. These sound waves will be captured in real-time and changed into images of your heart that can be viewed on a video monitor. The images will be recorded on a computer and reviewed by your health care provider. You may be asked to change positions or hold your breath for a short time. This makes it easier to get different views or better views of your heart. In some cases, you may receive contrast through an IV in one of your veins. This can improve the quality of the pictures from your heart. The procedure may vary among health care providers and hospitals.   What can I expect after the test? You may return to your normal, everyday life, including diet, activities, and medicines, unless your health care provider tells you not to do that. Follow these instructions at home: It is up to you to get the results of your test. Ask your health care provider, or the department that is doing the test, when your results will be ready. Keep all follow-up visits. This is important. Summary An echocardiogram is a test that uses sound waves (ultrasound) to produce images of the heart. Images from an echocardiogram can provide important information about the size and shape of your heart, heart muscle function, heart valve function, and other possible heart problems. You do not need to do anything to prepare before this test. You may eat and drink normally. After the echocardiogram is completed, you may return to your normal, everyday life, unless your health care provider tells you not to do that. This information is not intended to replace advice given to you by your health care provider. Make sure you discuss any questions you have with your health care provider. Document Revised: 09/14/2019 Document Reviewed: 09/14/2019 Elsevier Patient Education  2021 Elsevier Inc.   

## 2020-10-13 NOTE — Progress Notes (Signed)
Cardiology Office Note:    Date:  10/13/2020   ID:  Annette Huang, DOB March 09, 1927, MRN [id number]  PCP:  Angelina Sheriff, MD  Cardiologist:  Jenne Campus, MD    Referring MD: Angelina Sheriff, MD   Chief Complaint  Patient presents with   Follow-up  I am doing fine  History of Present Illness:    Annette Huang is a 87 y.o. female with past medical history significant for aortic stenosis which appears to be moderate to severe which is low-flow low gradient.  She also got COPD, she is a chronic smoker which is still ongoing, peripheral vascular disease in form of carotic arterial disease, essential hypertension, dyslipidemia. She comes today to my office for follow-up overall she seems to be doing well.  She does not have any chest pain tightness squeezing pressure burning chest, there is no dizziness no passing out, she described to have shortness of breath as usual which is a chronic complaint.  Past Medical History:  Diagnosis Date   Aortic stenosis 11/09/2014   Overview:  Mild in 2015 Moderate to severe in the October 2018   Chronic respiratory failure with hypoxia (HCC) 08/06/2018   02 hs x 2lpm as of 08/05/2018 and prn daytime to keep sats > 90%  - 09/01/2018 no desats walking / limited by back    COPD (chronic obstructive pulmonary disease) (HCC)    Coronary artery disease    CVA (cerebral vascular accident) ([place])    Diastolic congestive heart failure ([place]) 07/31/2018   DOE (dyspnea on exertion) 12/11/2016   Quit smoking 07/22/18 with emphysema on ct from 06/12/2018 - PFT 02/17/17    FEV1 1.35 [118%], ratio 0.82 with 14% resp to saba and TLC 93%, DLCO 73% - Echo 01/20/2018 c/w mod AS s PH - pt stopped vasotec and anoro on her own prior to pulmonary ov 08/05/2018 > rec leave off and rx for gerd x 4 weeks > improved as of 09/01/2018  - 09/01/2018   Walked RA x one lap =  approx 250 ft avg pace >> stopped due to   Essential hypertension 08/18/2013   rec Off acei permanently as of  08/05/2018 due to pseudowheeze    Greater trochanteric bursitis of right hip 11/13/2017   Hyperlipemia 08/18/2013   Hyperlipidemia    Hypertension    Late effects of CVA (cerebrovascular accident) 11/09/2014   Lumbar degenerative disc disease 06/18/2013   Lumbar spondylosis 06/18/2013   Peripheral vascular disease, unspecified ([place]) status post right carotic endarterectomy 08/28/2018   Sacroiliac inflammation ([place]) 11/13/2017   Sacroiliitis ([place]) 11/13/2017   Smoking 05/23/2015   Spinal stenosis of lumbar region with neurogenic claudication 08/18/2013   Stroke ([place]) 09/08/2013   Thoracic aortic atherosclerosis (HCC)     Past Surgical History:  Procedure Laterality Date   CAROTID ENDARTERECTOMY Bilateral    NECK SURGERY      Current Medications: Current Meds  Medication Sig   albuterol (VENTOLIN HFA) 108 (90 Base) MCG/ACT inhaler Inhale 1 puff into the lungs as needed for wheezing or shortness of breath.   amLODipine (NORVASC) 2.5 MG tablet Take 1 tablet (2.5 mg total) by mouth daily.   atorvastatin (LIPITOR) 40 MG tablet TAKE 1 TABLET BY MOUTH EVERY DAY (Patient taking differently: Take 40 mg by mouth daily. TAKE 1 TABLET BY MOUTH EVERY DAY)   clopidogrel (PLAVIX) 75 MG tablet Take 75 mg by mouth daily.    Cyanocobalamin (VITAMIN B12 PO) Take 1 tablet  by mouth daily. Unknown strenght   famotidine (PEPCID) 20 MG tablet One after supper (Patient taking differently: Take 20 mg by mouth as needed for heartburn or indigestion.)   furosemide (LASIX) 40 MG tablet Take 40 mg daily by mouth.   ipratropium-albuterol (DUONEB) 0.5-2.5 (3) MG/3ML SOLN Take 3 mLs by nebulization every 4 (four) hours as needed (Wheezing  and SOB).   levothyroxine (SYNTHROID, LEVOTHROID) 50 MCG tablet Take 1 tablet daily by mouth.   metoprolol tartrate (LOPRESSOR) 25 MG tablet Take 1 tablet (25 mg total) by mouth 2 (two) times daily.   OXYGEN Inhale 2 L into the lungs as needed (Mainly at bedtime). 2lpm as needed    potassium chloride (MICRO-K) 10 MEQ CR capsule TAKE 1 CAPSULE TWICE DAILY (Patient taking differently: Take 10 mEq by mouth 2 (two) times daily.)   Vitamin D, Ergocalciferol, (DRISDOL) 1.25 MG (50000 UT) CAPS capsule Take 50,000 Units by mouth 2 (two) times a week.     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  Tobacco Use   Smoking status: Every Day    Packs/day: 1.00    Years: 35.00    Pack years: 35.00    Types: Cigarettes    Last attempt to quit: 07/22/2018    Years since quitting: 2.2   Smokeless tobacco: Never  Vaping Use   Vaping Use: Former  Substance and Sexual Activity   Alcohol use: No   Drug use: No   Sexual activity: Not on file  Other Topics Concern   Not on file  Social History Narrative   Not on file   Social Determinants of Health   Financial Resource Strain: Not on file  Food Insecurity: Not on file  Transportation Needs: Not on file  Physical Activity: Not on file  Stress: Not on file  Social Connections: Not on file     Family History: The patient's family history includes Heart disease in her father and mother; Hypertension in her mother. ROS:   Please see the history of present illness.    All 14 point review of systems negative except as described per history of present illness  EKGs/Labs/Other Studies Reviewed:      Recent Labs: 11/02/2019: BNP 87.6; BUN 19; Creatinine, Ser 1.10; Potassium 3.9; Sodium 144  Recent Lipid Panel    Component Value Date/Time   CHOL 132 09/14/2019 1006   TRIG 59 09/14/2019 1006   HDL 54 09/14/2019 1006   CHOLHDL 2.4 09/14/2019 1006   LDLCALC 65 09/14/2019 1006    Physical Exam:    VS:  BP 122/74 (BP Location: Left Arm, Patient Position: Sitting)   Pulse 60    Ht 4\' 11"  (1.499 m)   Wt 142 lb 9.6 oz (64.7 kg)   SpO2 93%   BMI 28.80 kg/m     Wt Readings from Last 3 Encounters:  10/13/20 142 lb 9.6 oz (64.7 kg)  06/09/20 144 lb (65.3 kg)  05/03/20 138 lb 12.8 oz (63 kg)     GEN:  Well nourished, well developed in no acute distress HEENT: Normal NECK: No JVD; No carotid bruits LYMPHATICS: No lymphadenopathy CARDIAC: RRR, systolic ejection murmur 2-3 over 6 best heard at  the right upper portion of the sternum, tones are distant overall S2 is still present, no rubs, no gallops RESPIRATORY:  Clear to auscultation without rales, wheezing or rhonchi  ABDOMEN: Soft, non-tender, non-distended MUSCULOSKELETAL:  No edema; No deformity  SKIN: Warm and dry LOWER EXTREMITIES: no swelling NEUROLOGIC:  Alert and oriented x 3 PSYCHIATRIC:  Normal affect   ASSESSMENT:    1. Nonrheumatic aortic valve stenosis   2. Thoracic aortic atherosclerosis ([place])   3. Coronary artery disease involving native coronary artery of native heart without angina pectoris   4. Late effects of CVA (cerebrovascular accident)   5. Mixed hyperlipidemia   6. Smoking    PLAN:    In order of problems listed above:  Aortic stenosis time to repeat echocardiogram which I will schedule her to have in about a month or 2.  Likely she does have any symptomatology suggesting critical aortic stenosis.  Continue monitoring.  On top of that overall she is a poor candidate for open heart surgery so in the future if needs to be TAVI will be probably the only option.  At the same time she does have advanced peripheral vascular disease so we may be facing difficult situation. Coronary disease stable from that point review denies have any chest pain tightness pressure burning in the chest Late effect of CVA stable dyslipidemia: I do have her K PN which show me LDL of 65 HDL 61 she is on Lipitor 40 which I will continue. Smoking: Still ongoing problem I spent a great of time talking about this  she is very frustrated and almost embarrassed about the fact that she still continues to smoke.  She said that insurance company gave her 7 mg of nicotine patch that she tried did not help much.  I told her she may require higher dose like 14.   Medication Adjustments/Labs and Tests Ordered: Current medicines are reviewed at length with the patient today.  Concerns regarding medicines are outlined above.  No orders of the defined types were placed in this encounter.  Medication changes: No orders of the defined types were placed in this encounter.   Signed, Park Liter, MD, [facility] 10/13/2020 3:39 PM    [place]

## 2020-10-25 ENCOUNTER — Telehealth: Payer: Self-pay | Admitting: Cardiology

## 2020-10-25 NOTE — Telephone Encounter (Signed)
Annette Huang is calling needing to know why another Echo has been ordered as STAT when the patient just had an Echo in May. She states they only have an hour left on this STAT request. The case number ID is [id number].

## 2020-10-25 NOTE — Telephone Encounter (Signed)
Spoke with Annette Huang and advised echo is not needed until 04/2021.

## 2020-10-30 DIAGNOSIS — J449 Chronic obstructive pulmonary disease, unspecified: Secondary | ICD-10-CM | POA: Diagnosis not present

## 2020-11-13 ENCOUNTER — Other Ambulatory Visit: Payer: Medicare HMO

## 2020-11-29 DIAGNOSIS — J449 Chronic obstructive pulmonary disease, unspecified: Secondary | ICD-10-CM | POA: Diagnosis not present

## 2020-12-20 ENCOUNTER — Other Ambulatory Visit: Payer: Self-pay | Admitting: Cardiovascular Disease

## 2021-01-08 DIAGNOSIS — Z6829 Body mass index (BMI) 29.0-29.9, adult: Secondary | ICD-10-CM | POA: Diagnosis not present

## 2021-01-08 DIAGNOSIS — I10 Essential (primary) hypertension: Secondary | ICD-10-CM | POA: Diagnosis not present

## 2021-01-08 DIAGNOSIS — N390 Urinary tract infection, site not specified: Secondary | ICD-10-CM | POA: Diagnosis not present

## 2021-01-24 DIAGNOSIS — N390 Urinary tract infection, site not specified: Secondary | ICD-10-CM | POA: Diagnosis not present

## 2021-01-24 DIAGNOSIS — N952 Postmenopausal atrophic vaginitis: Secondary | ICD-10-CM | POA: Diagnosis not present

## 2021-01-24 DIAGNOSIS — Z79899 Other long term (current) drug therapy: Secondary | ICD-10-CM | POA: Diagnosis not present

## 2021-01-24 DIAGNOSIS — N3281 Overactive bladder: Secondary | ICD-10-CM | POA: Diagnosis not present

## 2021-02-10 DIAGNOSIS — R0602 Shortness of breath: Secondary | ICD-10-CM | POA: Diagnosis not present

## 2021-02-10 DIAGNOSIS — J189 Pneumonia, unspecified organism: Secondary | ICD-10-CM | POA: Diagnosis not present

## 2021-02-10 DIAGNOSIS — R918 Other nonspecific abnormal finding of lung field: Secondary | ICD-10-CM | POA: Diagnosis not present

## 2021-02-10 DIAGNOSIS — I509 Heart failure, unspecified: Secondary | ICD-10-CM | POA: Diagnosis not present

## 2021-02-10 DIAGNOSIS — Z20822 Contact with and (suspected) exposure to covid-19: Secondary | ICD-10-CM | POA: Diagnosis not present

## 2021-02-10 DIAGNOSIS — J441 Chronic obstructive pulmonary disease with (acute) exacerbation: Secondary | ICD-10-CM | POA: Diagnosis not present

## 2021-02-10 DIAGNOSIS — R531 Weakness: Secondary | ICD-10-CM | POA: Diagnosis not present

## 2021-02-10 DIAGNOSIS — J9601 Acute respiratory failure with hypoxia: Secondary | ICD-10-CM | POA: Diagnosis not present

## 2021-02-10 DIAGNOSIS — Z9981 Dependence on supplemental oxygen: Secondary | ICD-10-CM | POA: Diagnosis not present

## 2021-02-10 DIAGNOSIS — I110 Hypertensive heart disease with heart failure: Secondary | ICD-10-CM | POA: Diagnosis not present

## 2021-02-10 DIAGNOSIS — J440 Chronic obstructive pulmonary disease with acute lower respiratory infection: Secondary | ICD-10-CM | POA: Diagnosis not present

## 2021-02-10 DIAGNOSIS — Z8673 Personal history of transient ischemic attack (TIA), and cerebral infarction without residual deficits: Secondary | ICD-10-CM | POA: Diagnosis not present

## 2021-02-19 DIAGNOSIS — Z8673 Personal history of transient ischemic attack (TIA), and cerebral infarction without residual deficits: Secondary | ICD-10-CM | POA: Diagnosis not present

## 2021-02-19 DIAGNOSIS — G4700 Insomnia, unspecified: Secondary | ICD-10-CM | POA: Diagnosis not present

## 2021-02-19 DIAGNOSIS — Z6829 Body mass index (BMI) 29.0-29.9, adult: Secondary | ICD-10-CM | POA: Diagnosis not present

## 2021-02-19 DIAGNOSIS — S0990XA Unspecified injury of head, initial encounter: Secondary | ICD-10-CM | POA: Diagnosis not present

## 2021-02-19 DIAGNOSIS — I6381 Other cerebral infarction due to occlusion or stenosis of small artery: Secondary | ICD-10-CM | POA: Diagnosis not present

## 2021-02-19 DIAGNOSIS — S0003XA Contusion of scalp, initial encounter: Secondary | ICD-10-CM | POA: Diagnosis not present

## 2021-02-19 DIAGNOSIS — J441 Chronic obstructive pulmonary disease with (acute) exacerbation: Secondary | ICD-10-CM | POA: Diagnosis not present

## 2021-02-19 DIAGNOSIS — S0083XA Contusion of other part of head, initial encounter: Secondary | ICD-10-CM | POA: Diagnosis not present

## 2021-03-26 DIAGNOSIS — N952 Postmenopausal atrophic vaginitis: Secondary | ICD-10-CM | POA: Diagnosis not present

## 2021-03-26 DIAGNOSIS — N3281 Overactive bladder: Secondary | ICD-10-CM | POA: Diagnosis not present

## 2021-03-26 DIAGNOSIS — N390 Urinary tract infection, site not specified: Secondary | ICD-10-CM | POA: Diagnosis not present

## 2021-04-11 ENCOUNTER — Ambulatory Visit (INDEPENDENT_AMBULATORY_CARE_PROVIDER_SITE_OTHER): Payer: Medicare HMO

## 2021-04-11 ENCOUNTER — Other Ambulatory Visit: Payer: Self-pay

## 2021-04-11 DIAGNOSIS — I350 Nonrheumatic aortic (valve) stenosis: Secondary | ICD-10-CM

## 2021-04-11 DIAGNOSIS — I2510 Atherosclerotic heart disease of native coronary artery without angina pectoris: Secondary | ICD-10-CM | POA: Diagnosis not present

## 2021-04-11 LAB — ECHOCARDIOGRAM COMPLETE
AR max vel: 0.86 cm2
AV Area VTI: 1 cm2
AV Area mean vel: 0.91 cm2
AV Mean grad: 21 mmHg
AV Peak grad: 36.5 mmHg
Ao pk vel: 3.02 m/s
Area-P 1/2: 2.2 cm2
MV VTI: 1.5 cm2
S' Lateral: 2.6 cm

## 2021-04-18 ENCOUNTER — Encounter: Payer: Self-pay | Admitting: Cardiology

## 2021-04-18 ENCOUNTER — Ambulatory Visit: Payer: Medicare HMO | Admitting: Cardiology

## 2021-04-18 ENCOUNTER — Other Ambulatory Visit: Payer: Self-pay

## 2021-04-18 VITALS — BP 136/64 | HR 57 | Ht 59.0 in | Wt 142.4 lb

## 2021-04-18 DIAGNOSIS — I5032 Chronic diastolic (congestive) heart failure: Secondary | ICD-10-CM | POA: Diagnosis not present

## 2021-04-18 DIAGNOSIS — I350 Nonrheumatic aortic (valve) stenosis: Secondary | ICD-10-CM | POA: Diagnosis not present

## 2021-04-18 DIAGNOSIS — I10 Essential (primary) hypertension: Secondary | ICD-10-CM | POA: Diagnosis not present

## 2021-04-18 DIAGNOSIS — I6990 Unspecified sequelae of unspecified cerebrovascular disease: Secondary | ICD-10-CM

## 2021-04-18 DIAGNOSIS — F17200 Nicotine dependence, unspecified, uncomplicated: Secondary | ICD-10-CM

## 2021-04-18 DIAGNOSIS — IMO0001 Reserved for inherently not codable concepts without codable children: Secondary | ICD-10-CM

## 2021-04-18 NOTE — Progress Notes (Signed)
?Cardiology Office Note:   ? ?Date:  04/18/2021  ? ?ID:  Annette Huang, DOB Sep 05, 1927, MRN [id number] ? ?PCP:  Angelina Sheriff, MD  ?Cardiologist:  Jenne Campus, MD   ? ?Referring MD: Angelina Sheriff, MD  ? ?Chief Complaint  ?Patient presents with  ? Follow-up  ?I am doing fine ? ?History of Present Illness:   ? ?Annette Huang is a 88 y.o. female with past medical history significant for aortic stenosis which is moderate, advanced COPD, she is a chronic smoker and still continues to smoke, carotid arterial disease, essential hypertension, dyslipidemia. ?She is in my office today for follow-up.  Overall she tells me she is doing the same I do see admission to the emergency room at the beginning of January when she was hypoxic.  She actually was offered admission however she declined and gradually get better still continues to smoke but trying to fight and smoke much less. ? ?Past Medical History:  ?Diagnosis Date  ? Aortic stenosis 11/09/2014  ? Overview:  Mild in 2015 Moderate to severe in the October 2018  ? Chronic respiratory failure with hypoxia (HCC) 08/06/2018  ? 02 hs x 2lpm as of 08/05/2018 and prn daytime to keep sats > 90%  - 09/01/2018 no desats walking / limited by back   ? COPD (chronic obstructive pulmonary disease) ([place])   ? Coronary artery disease   ? CVA (cerebral vascular accident) [facility])   ? Diastolic congestive heart failure ([place]) 07/31/2018  ? DOE (dyspnea on exertion) 12/11/2016  ? Quit smoking 07/22/18 with emphysema on ct from 06/12/2018 - PFT 02/17/17    FEV1 1.35 [118%], ratio 0.82 with 14% resp to saba and TLC 93%, DLCO 73% - Echo 01/20/2018 c/w mod AS s PH - pt stopped vasotec and anoro on her own prior to pulmonary ov 08/05/2018 > rec leave off and rx for gerd x 4 weeks > improved as of 09/01/2018  - 09/01/2018   Walked RA x one lap =  approx 250 ft avg pace >> stopped due to  ? Essential hypertension 08/18/2013  ? rec Off acei permanently as of 08/05/2018 due to pseudowheeze   ? Greater  trochanteric bursitis of right hip 11/13/2017  ? Hyperlipemia 08/18/2013  ? Hyperlipidemia   ? Hypertension   ? Late effects of CVA (cerebrovascular accident) 11/09/2014  ? Lumbar degenerative disc disease 06/18/2013  ? Lumbar spondylosis 06/18/2013  ? Peripheral vascular disease, unspecified ([place]) status post right carotic endarterectomy 08/28/2018  ? Sacroiliac inflammation ([place]) 11/13/2017  ? Sacroiliitis ([place]) 11/13/2017  ? Smoking 05/23/2015  ? Spinal stenosis of lumbar region with neurogenic claudication 08/18/2013  ? Stroke ([place]) 09/08/2013  ? Thoracic aortic atherosclerosis ([place])   ? ? ?Past Surgical History:  ?Procedure Laterality Date  ? CAROTID ENDARTERECTOMY Bilateral   ? NECK SURGERY    ? ? ?Current Medications: ?Current Meds  ?Medication Sig  ? albuterol (VENTOLIN HFA) 108 (90 Base) MCG/ACT inhaler Inhale 1 puff into the lungs as needed for wheezing or shortness of breath.  ? alendronate (FOSAMAX) 70 MG tablet Take 70 mg by mouth once a week.  ? amLODipine (NORVASC) 2.5 MG tablet Take 1 tablet (2.5 mg total) by mouth daily.  ? atorvastatin (LIPITOR) 40 MG tablet TAKE 1 TABLET BY MOUTH EVERY DAY (Patient taking differently: Take 40 mg by mouth daily. TAKE 1 TABLET BY MOUTH EVERY DAY)  ? clopidogrel (PLAVIX) 75 MG tablet Take 75 mg by mouth daily.   ?  Cyanocobalamin (VITAMIN B12 PO) Take 1 tablet by mouth daily. Unknown strenght  ? famotidine (PEPCID) 20 MG tablet One after supper (Patient taking differently: Take 20 mg by mouth as needed for heartburn or indigestion.)  ? furosemide (LASIX) 40 MG tablet Take 40 mg daily by mouth.  ? ipratropium-albuterol (DUONEB) 0.5-2.5 (3) MG/3ML SOLN Take 3 mLs by nebulization every 4 (four) hours as needed (Wheezing  and SOB).  ? levothyroxine (SYNTHROID, LEVOTHROID) 50 MCG tablet Take 1 tablet daily by mouth.  ? metoprolol tartrate (LOPRESSOR) 25 MG tablet TAKE 1 TABLET BY MOUTH 2 TIMES DAILY. (Patient taking differently: Take 25 mg by mouth 2 (two) times daily.)  ? OXYGEN  Inhale 2 L into the lungs as needed (Mainly at bedtime). 2lpm as needed  ? potassium chloride (MICRO-K) 10 MEQ CR capsule TAKE 1 CAPSULE TWICE DAILY (Patient taking differently: Take 10 mEq by mouth 2 (two) times daily.)  ? Vitamin D, Ergocalciferol, (DRISDOL) 1.25 MG (50000 UT) CAPS capsule Take 50,000 Units by mouth 2 (two) times a week.  ?  ? ?Allergies:   Codeine  ? ?Social History  ? ?Socioeconomic History  ? Marital status: Widowed  ?  Spouse name: Not on file  ? Number of children: 8  ? Years of education: Not on file  ? Highest education level: Not on file  ?Occupational History  ? 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 Smoking status: Every Day  ?  Packs/day: 1.00  ?  Years: 35.00  ?  Pack years: 35.00  ?  Types: Cigarettes  ?  Last attempt to quit: 07/22/2018  ?  Years since quitting: 2.7  ? Smokeless tobacco: Never  ?Vaping Use  ? Vaping Use: Former  ?Substance and Sexual Activity  ? Alcohol use: No  ? Drug use: No  ? Sexual activity: Not on file  ?Other Topics Concern  ? Not on file  ?Social History Narrative  ? Not on file  ? ?Social Determinants of Health  ? ?Financial Resource Strain: Not on file  ?Food Insecurity: Not on file  ?Transportation Needs: Not on file  ?Physical Activity: Not on file  ?Stress: Not on file  ?Social Connections: Not on file  ?  ? ?Family History: ?The patient's family history includes Heart disease in her father and mother; Hypertension in her mother. ?ROS:   ?Please see the history of present illness.    ?All 14 point review of systems negative except as described per history of present illness ? ?EKGs/Labs/Other Studies Reviewed:   ? ? ? ?Recent Labs: ?No results found for requested labs within last 8760 hours.  ?Recent Lipid Panel ?   ?Component Value Date/Time  ? CHOL 132 09/14/2019 1006  ? TRIG 59 09/14/2019 1006  ? HDL 54 09/14/2019 1006  ? CHOLHDL 2.4 09/14/2019 1006  ? [place] 65 09/14/2019 1006  ? ? ?Physical Exam:   ? ?VS:  BP 136/64 (BP  Location: Left Arm, Patient Position: Sitting)   Pulse (!) 57   Ht '4\' 11"'$  (1.499 m)   Wt 142 lb 6.4 oz (64.6 kg)   SpO2 97%   BMI 28.76 kg/m?    ? ?Wt Readings from Last 3 Encounters:  ?04/18/21 142 lb 6.4 oz (64.6 kg)  ?10/13/20 142 lb 9.6 oz (64.7 kg)  ?06/09/20 144 lb (65.3 kg)  ?  ? ?GEN:  Well nourished, well developed in no acute distress ?HEENT: Normal ?NECK: No JVD; No carotid bruits ?  LYMPHATICS: No lymphadenopathy ?CARDIAC: RRR, systolic ejection murmur grade 2/6 to 3/6 best heard right upper portion of sternum, S2 is still present  no rubs, no gallops ?RESPIRATORY:  Clear to auscultation without rales, wheezing or rhonchi  ?ABDOMEN: Soft, non-tender, non-distended ?MUSCULOSKELETAL:  No edema; No deformity  ?SKIN: Warm and dry ?LOWER EXTREMITIES: no swelling ?NEUROLOGIC:  Alert and oriented x 3 ?PSYCHIATRIC:  Normal affect  ? ?ASSESSMENT:   ? ?1. Nonrheumatic aortic valve stenosis   ?2. Essential hypertension   ?3. Chronic diastolic congestive heart failure ([place])   ?4. Primary hypertension   ?5. Smoking   ?6. Late effects of CVA (cerebrovascular accident)   ? ?PLAN:   ? ?In order of problems listed above: ? ?Nonrheumatic aortic valve stenosis.  Valve looks more or less the same like it did 6 months ago.  Mean gradient 2021 with calculated aortic valve area 1 of 1 cm? with dimensional index of 0.35, stroke-volume index was 48.  Data from 6 months ago mean gradient 21 area of 1 cm.  Dimensionless 0.36 stroke-volume index is 51.  There is exact same numbers.  She does have some shortness of breath, shortness of breath probably related to COPD denies have any chest pain tightness squeezing pressure burning chest no passing out no dizziness. ?Essential hypertension: Blood pressure seems to well controlled we will continue present management. ?Dyslipidemia I did review K PN which show me total cholesterol 175 HDL 61 LDL 65 she is on Lipitor 40 which is high intense statin which I will continue. ?Peripheral  vascular disease up to 40% both internal carotic arteries. ?When I see her we will schedule her to have carotic ultrasound. ?Smoking obviously a problem she was strongly recommended to quit ? ? ?Medication Ad

## 2021-04-18 NOTE — Patient Instructions (Signed)

## 2021-04-23 ENCOUNTER — Telehealth: Payer: Self-pay

## 2021-05-01 ENCOUNTER — Telehealth: Payer: Self-pay | Admitting: *Deleted

## 2021-05-01 NOTE — Telephone Encounter (Signed)
? ?  [facility] Pre-operative Risk Assessment  ?  ?Request for surgical clearance: ? ?What type of surgery is being performed? Dental Procedure  ? ?When is this surgery scheduled? TBD  ? ?What type of clearance is required (medical clearance vs. Pharmacy clearance to hold med vs. Both)? Both ? ?Are there any medications that need to be held prior to surgery and how long?Plavix  ? ?Practice name and name of physician performing surgery? Dr. Collene Leyden. Madilyn Fireman, DDS, MS  ? ?What is your office phone number [phone]  ?  ?7.   What is your office fax number [phone] ? ?8.   Anesthesia type (None, local, MAC, general) ? Unknown ? ? ?Roselie Skinner ?05/01/2021, 4:26 PM  ?_________________________________________________________________ ?  (provider comments below)  ?

## 2021-05-01 NOTE — Telephone Encounter (Signed)
Please get more details.

## 2021-05-02 NOTE — Telephone Encounter (Signed)
Spoke with dentist office and the patient needs to have gum surgery of the lower left area. They will go in and clean off the teeth and roots under local anesthesia. ?

## 2021-05-02 NOTE — Telephone Encounter (Signed)
Attempted to call the dentist office twice without success.  I wanted to figure out the extent of gum surgery and if significant amount of bleeding is expected.  If so, will need to review possibly holding Plavix therapy. ?

## 2021-05-03 NOTE — Telephone Encounter (Signed)
I s/w Annette Huang with Dr. Madilyn Fireman, [place] office. I did clarify the procedure generally generates mild bleeding, however, every person is different. They will cut into the gum line and do a deep cleaning and suture gum line back up. Per Annette Huang they generally hold Plavix x 5 days. I assured Annette Huang I will forward back to our pre op provider for assessment. Once clearance is finalized we will fax clearance to fax # [phone]. I thanked Annette Huang for the help today.  ?

## 2021-05-03 NOTE — Telephone Encounter (Signed)
? ?  Primary Cardiologist: Jenne Campus, MD ? ?Chart reviewed as part of pre-operative protocol coverage. Given past medical history and time since last visit, based on ACC/AHA guidelines, Annette Huang would be at acceptable risk for the planned procedure without further cardiovascular testing.  ? ?Patient's Plavix is not managed by cardiology.  Recommendations for holding Plavix will need to come from prescribing provider.  She is taking Plavix for history of CVA/stroke. ? ?I will route this recommendation to the requesting party via Epic fax function and remove from pre-op pool. ? ?Please call with questions. ? ?Jossie Ng. Kaleem Sartwell NP-C ? ?  ?05/03/2021, 12:53 PM ?[place] ?[address] ?Office [phone] Fax [phone] ? ? ? ? ?

## 2021-05-04 DIAGNOSIS — E785 Hyperlipidemia, unspecified: Secondary | ICD-10-CM | POA: Diagnosis not present

## 2021-05-04 DIAGNOSIS — I10 Essential (primary) hypertension: Secondary | ICD-10-CM | POA: Diagnosis not present

## 2021-05-10 NOTE — Telephone Encounter (Signed)
Virgel Paling DDS, is calling in regards to this clearance. NP advised Plavix is not managed by cardiology, but they have contacted both additional providers the patient is seen by and was advised to go through cardiology. Santiago Glad left her cell number for callback due to being out of office. Please advise.  ?

## 2021-05-11 NOTE — Telephone Encounter (Signed)
I ?  ? ?Patient Name: Annette Huang  ?DOB: Oct 02, 1927 ?MRN: [id number] ? ?Primary Cardiologist: Jenne Campus, MD ? ?Chart reviewed as part of pre-operative protocol coverage.  ? ?In reviewing the chart, patient is on Plavix for stroke prophylaxis. Therefore, the need to hold Plavix for upcoming procedure would need to be addressed either by neurology or patient's PCP, as Plavix is not managed by cardiology. ? ?Thank you. ? ?Lenna Sciara, NP ?05/11/2021, 8:01 AM ? ? ? ?

## 2021-05-21 ENCOUNTER — Other Ambulatory Visit: Payer: Self-pay | Admitting: Cardiology

## 2021-06-03 DIAGNOSIS — I10 Essential (primary) hypertension: Secondary | ICD-10-CM | POA: Diagnosis not present

## 2021-06-03 DIAGNOSIS — E785 Hyperlipidemia, unspecified: Secondary | ICD-10-CM | POA: Diagnosis not present

## 2021-06-12 ENCOUNTER — Other Ambulatory Visit: Payer: Self-pay | Admitting: Cardiovascular Disease

## 2021-06-14 DIAGNOSIS — R918 Other nonspecific abnormal finding of lung field: Secondary | ICD-10-CM | POA: Diagnosis not present

## 2021-06-14 DIAGNOSIS — J439 Emphysema, unspecified: Secondary | ICD-10-CM | POA: Diagnosis not present

## 2021-06-14 DIAGNOSIS — K219 Gastro-esophageal reflux disease without esophagitis: Secondary | ICD-10-CM | POA: Diagnosis not present

## 2021-06-14 DIAGNOSIS — J9621 Acute and chronic respiratory failure with hypoxia: Secondary | ICD-10-CM | POA: Diagnosis not present

## 2021-06-14 DIAGNOSIS — R911 Solitary pulmonary nodule: Secondary | ICD-10-CM | POA: Diagnosis not present

## 2021-06-14 DIAGNOSIS — E7800 Pure hypercholesterolemia, unspecified: Secondary | ICD-10-CM | POA: Diagnosis not present

## 2021-06-14 DIAGNOSIS — I700 Atherosclerosis of aorta: Secondary | ICD-10-CM | POA: Diagnosis not present

## 2021-06-14 DIAGNOSIS — J449 Chronic obstructive pulmonary disease, unspecified: Secondary | ICD-10-CM | POA: Diagnosis not present

## 2021-06-14 DIAGNOSIS — I509 Heart failure, unspecified: Secondary | ICD-10-CM | POA: Diagnosis not present

## 2021-06-14 DIAGNOSIS — R0902 Hypoxemia: Secondary | ICD-10-CM | POA: Diagnosis not present

## 2021-06-14 DIAGNOSIS — Z720 Tobacco use: Secondary | ICD-10-CM | POA: Diagnosis not present

## 2021-06-14 DIAGNOSIS — N179 Acute kidney failure, unspecified: Secondary | ICD-10-CM | POA: Diagnosis not present

## 2021-06-14 DIAGNOSIS — Z9981 Dependence on supplemental oxygen: Secondary | ICD-10-CM | POA: Diagnosis not present

## 2021-06-14 DIAGNOSIS — Z20828 Contact with and (suspected) exposure to other viral communicable diseases: Secondary | ICD-10-CM | POA: Diagnosis not present

## 2021-06-14 DIAGNOSIS — J441 Chronic obstructive pulmonary disease with (acute) exacerbation: Secondary | ICD-10-CM | POA: Diagnosis not present

## 2021-06-14 DIAGNOSIS — J9610 Chronic respiratory failure, unspecified whether with hypoxia or hypercapnia: Secondary | ICD-10-CM | POA: Diagnosis not present

## 2021-06-14 DIAGNOSIS — R0602 Shortness of breath: Secondary | ICD-10-CM | POA: Diagnosis not present

## 2021-06-14 DIAGNOSIS — M1990 Unspecified osteoarthritis, unspecified site: Secondary | ICD-10-CM | POA: Diagnosis not present

## 2021-06-14 DIAGNOSIS — R062 Wheezing: Secondary | ICD-10-CM | POA: Diagnosis not present

## 2021-06-14 DIAGNOSIS — J9811 Atelectasis: Secondary | ICD-10-CM | POA: Diagnosis not present

## 2021-06-14 DIAGNOSIS — E039 Hypothyroidism, unspecified: Secondary | ICD-10-CM | POA: Diagnosis not present

## 2021-06-14 DIAGNOSIS — I110 Hypertensive heart disease with heart failure: Secondary | ICD-10-CM | POA: Diagnosis not present

## 2021-06-25 ENCOUNTER — Encounter: Payer: Self-pay | Admitting: Internal Medicine

## 2021-06-25 ENCOUNTER — Ambulatory Visit: Payer: Medicare HMO | Admitting: Internal Medicine

## 2021-06-25 DIAGNOSIS — R0609 Other forms of dyspnea: Secondary | ICD-10-CM

## 2021-06-25 DIAGNOSIS — R058 Other specified cough: Secondary | ICD-10-CM

## 2021-06-25 DIAGNOSIS — J9611 Chronic respiratory failure with hypoxia: Secondary | ICD-10-CM | POA: Diagnosis not present

## 2021-06-25 MED ORDER — FAMOTIDINE 20 MG PO TABS
ORAL_TABLET | ORAL | 11 refills | Status: AC
Start: 2021-06-25 — End: ?

## 2021-06-25 NOTE — Patient Instructions (Addendum)
Stop fosamax  the alternatives are Prolia injection (shots twice a year or relclast infusion (once a year)   Pantoprazole (protonix) 40 mg   Take  30-60 min before first meal of the day and Pepcid (famotidine)  20 mg after supper until return to office - this is the best way to tell whether stomach acid is contributing to your problem.    GERD (REFLUX)  is an extremely common cause of respiratory symptoms just like yours , many times with no obvious heartburn at all.    It can be treated with medication, but also with lifestyle changes including elevation of the head of your bed (ideally with 6 -8inch blocks under the headboard of your bed),  Smoking cessation, avoidance of late meals, excessive alcohol, and avoid fatty foods, chocolate, peppermint, colas, red wine, and acidic juices such as orange juice.  NO MINT OR MENTHOL PRODUCTS SO NO COUGH DROPS  USE SUGARLESS CANDY INSTEAD (Jolley ranchers or Stover's or Life Savers) or even ice chips will also do - the key is to swallow to prevent all throat clearing. NO OIL BASED VITAMINS - use powdered substitutes.  Avoid fish oil when coughing.   Make sure you check your oxygen saturation  AT  your highest level of activity (not after you stop)   to be sure it stays over 90% and adjust  02 flow upward to maintain this level if needed but remember to turn it back to previous settings when you stop (to conserve your supply).    Please schedule a follow up office visit in 4 weeks, sooner if needed

## 2021-06-25 NOTE — Progress Notes (Unsigned)
Annette Huang, female    DOB: Jul 21, 1927,   MRN: [id number]   Brief patient profile:  [id number]  Quit smoking  06/2021  referred to pulmonary clinic 08/05/2018 by Dr  Lin Landsman for eval of cough/ sob ? All copd related though note last pfts on record >>>02/17/17    FEV1 1.35 [118%], ratio 0.82 with 14% resp to saba and TLC 93%, DLCO 73%     History of Present Illness  08/05/2018  Pulmonary/ 1st office eval/Annette Huang  - stopped vasotec/anoro prior to ov  Chief Complaint  Patient presents with   Pulmonary Consult    Referred by Dr Lovette Cliche for eval of COPD. Pt c/o increased cough and SOB for the past few wks.   Dyspnea:  Can lean on buggy food lion = MMRC2 = can't walk a nl pace on a flat grade s sob but does fine slow and flat  Cough: some throat congestion  Sleep: wedge x 30 degrees  And 2lpm at hs  SABA use: duoneb 2-3 per day / was only taking prn anoro prior to stopping it completely Cough some better with prednisone but never compltely free of sense of throat congestion Has 02 not using x at hs 2lpm rec Continue off anoro and vasotec as you are  Pantoprazole (protonix) 40 mg   Take  30-60 min before first meal of the day and Pepcid (famotidine)  20 mg one  After supper  until return to office - this is the best way to tell whether stomach acid is contributing to your problem.  GERD diet  Only use your albuterol-ipatropium  as a rescue medication   Wear 2lpm at bedtime and during the day as needed to keep your saturation above 90% and adjust the flow to keep it over 90% at all times to help your heart deliver 0xygen without having to strain.    09/01/2018  f/u ov/Annette Huang re: doe/ min copd clinically  Chief Complaint  Patient presents with   Follow-up    Breathing is overall doing well and she has not used her neb or o2 recently.   Dyspnea:  Better but not very active Cough: none / throat congetstion is better  Sleeping: able to lie flat one pillow SABA use: none  02: just sometimes  at hs / not checking sats with activity  rec Wear 02 at bedtime  X 2lpm and as needed during the day for short of breath or 02 level less than 90% Nebulizer is only as needed    06/25/2021  f/u ov/Annette Huang re: doe / min copd   maint on nothing has neb prn and started April 20th  p ? Cold > change neb to qid  On fosamax  Chief Complaint  Patient presents with   Follow-up    DOE  Dyspnea:  not back to baseline  Cough: not much  Sleeping: wedges doing fine  SABA use:qid  02: 2lpm bedtime and 2lpm  Covid status:   JJ   No obvious day to day or daytime variability or assoc excess/ purulent sputum or mucus plugs or hemoptysis or cp or chest tightness, subjective wheeze or overt sinus or hb symptoms.    Also denies any obvious fluctuation of symptoms with weather or environmental changes or other aggravating or alleviating factors except as outlined above   No unusual exposure hx or h/o childhood pna/ asthma or knowledge of premature birth.  Current Allergies, Complete Past Medical History, Past Surgical History, Family History,  and Social History were reviewed in [employer] record.  ROS  The following are not active complaints unless bolded Hoarseness, sore throat, dysphagia, dental problems, itching, sneezing,  nasal congestion or discharge of excess mucus or purulent secretions, ear ache,   fever, chills, sweats, unintended wt loss or wt gain, classically pleuritic or exertional cp,  orthopnea pnd or arm/hand swelling  or leg swelling, presyncope, palpitations, abdominal pain, anorexia, nausea, vomiting, diarrhea  or change in bowel habits or change in bladder habits, change in stools or change in urine, dysuria, hematuria,  rash, arthralgias, visual complaints, headache, numbness, weakness or ataxia or problems with walking or coordination,  change in mood or  memory.        Current Meds  Medication Sig   albuterol (VENTOLIN HFA) 108 (90 Base) MCG/ACT inhaler Inhale  1 puff into the lungs as needed for wheezing or shortness of breath.   alendronate (FOSAMAX) 70 MG tablet Take 70 mg by mouth once a week.   atorvastatin (LIPITOR) 40 MG tablet TAKE 1 TABLET BY MOUTH EVERY DAY   clopidogrel (PLAVIX) 75 MG tablet Take 75 mg by mouth daily.    Cyanocobalamin (VITAMIN B12 PO) Take 1 tablet by mouth daily. Unknown strenght   famotidine (PEPCID) 20 MG tablet One after supper (Patient taking differently: Take 20 mg by mouth as needed for heartburn or indigestion.)   furosemide (LASIX) 40 MG tablet Take 40 mg daily by mouth.   ipratropium-albuterol (DUONEB) 0.5-2.5 (3) MG/3ML SOLN Take 3 mLs by nebulization every 4 (four) hours as needed (Wheezing  and SOB).   levofloxacin (LEVAQUIN) 250 MG tablet Take by mouth.   levothyroxine (SYNTHROID, LEVOTHROID) 50 MCG tablet Take 1 tablet daily by mouth.   metoprolol tartrate (LOPRESSOR) 25 MG tablet TAKE 1 TABLET BY MOUTH 2 TIMES DAILY.   OXYGEN Inhale 2 L into the lungs as needed (Mainly at bedtime). 2lpm as needed   pantoprazole (PROTONIX) 40 MG tablet Take 40 mg by mouth daily.   potassium chloride (MICRO-K) 10 MEQ CR capsule TAKE 1 CAPSULE TWICE DAILY (Patient taking differently: Take 10 mEq by mouth 2 (two) times daily.)   predniSONE (DELTASONE) 10 MG tablet Take 10 mg by mouth 2 (two) times daily.   Vitamin D, Ergocalciferol, (DRISDOL) 1.25 MG (50000 UT) CAPS capsule Take 50,000 Units by mouth 2 (two) times a week.              Past Medical History:  Diagnosis Date   COPD (chronic obstructive pulmonary disease) ([place])    Coronary artery disease    CVA (cerebral vascular accident) ([place])    Hyperlipidemia    Hypertension    Thoracic aortic atherosclerosis ([place])       Objective:      amb wf easily confused with details of care   Wt Readings from Last 3 Encounters:  09/01/18 153 lb (69.4 kg)  08/28/18 153 lb 6.4 oz (69.6 kg)  08/05/18 151 lb (68.5 kg)     Vital signs reviewed - Note on arrival 02 sats   96% on RA          CV:  RRR  no s3    III/VI SEM s  increase in P2, and no edema       I personally reviewed images and agree with radiology impression as follows:   Chest CT 06/11/17 Linear atelectasis involving the LEFT lower lobe. Stable chronic scarring involving the RIGHT middle lobe. Visualized lung parenchyma otherwise clear.  Emphysematous changes in both Lungs.> no change ct 07/29/18     Assessment

## 2021-06-26 ENCOUNTER — Encounter: Payer: Self-pay | Admitting: Internal Medicine

## 2021-06-26 DIAGNOSIS — R058 Other specified cough: Secondary | ICD-10-CM | POA: Insufficient documentation

## 2021-06-26 DIAGNOSIS — J449 Chronic obstructive pulmonary disease, unspecified: Secondary | ICD-10-CM | POA: Diagnosis not present

## 2021-06-26 DIAGNOSIS — I350 Nonrheumatic aortic (valve) stenosis: Secondary | ICD-10-CM | POA: Diagnosis not present

## 2021-06-26 HISTORY — DX: Other specified cough: R05.8

## 2021-06-26 NOTE — Assessment & Plan Note (Addendum)
02 hs x 2lpm as of 08/05/2018 and prn daytime to keep sats > 90%  - 09/01/2018 no desats walking / limited by back    As of 06/25/2021  2lpm hs and prn with goal of > 90% at all times, esp in view of echo findings from 04/11/21

## 2021-06-26 NOTE — Assessment & Plan Note (Addendum)
D/c ACEi  2020 resolved -  pseudowheeze on fosamax 06/25/2021  > rec d/c and max gerd rx  Wheezing flared p uri and is all upper airway on exam  Upper airway cough syndrome (previously labeled PNDS),  is so named because it's frequently impossible to sort out how much is  CR/sinusitis with freq throat clearing (which can be related to primary GERD)   vs  causing  secondary (" extra esophageal")  GERD from wide swings in gastric pressure that occur with throat clearing, often  promoting self use of mint and menthol lozenges that reduce the lower esophageal sphincter tone and exacerbate the problem further in a cyclical fashion.   These are the same pts (now being labeled as having "irritable larynx syndrome" by some cough centers) who not infrequently have a history of having failed to tolerate ace inhibitors,  dry powder inhalers or biphosphonates or report having atypical/extraesophageal reflux symptoms that don't respond to standard doses of PPI  and are easily confused as having aecopd or asthma flares by even experienced allergists/ pulmonologists (myself included).   >>>  Would rec max gerd rx,  Trial off fosamax (see avs for options) and see if can reduce neb back to prn over the next 4 weeks and then  return here for re-evaluation.         Each maintenance medication was reviewed in detail including emphasizing most importantly the difference between maintenance and prns and under what circumstances the prns are to be triggered using an action plan format where appropriate.  Total time for H and P, chart review, counseling, reviewing neb/02 device(s) and generating customized AVS unique to this office visit / same day charting = 34 min

## 2021-06-26 NOTE — Assessment & Plan Note (Addendum)
Quit smoking 06/2021  with emphysema on ct from 06/12/2018 - PFT 02/17/17    FEV1 1.35 [118%], ratio 0.82 with 14% resp to saba and TLC 93%, DLCO 73% - Echo 01/20/2018 c/w mod AS s PH - pt stopped vasotec and anoro on her own prior to pulmonary ov 08/05/2018 > rec leave off and rx for gerd x 4 weeks > improved as of 09/01/2018  - 09/01/2018   Walked RA x one lap =  approx 250 ft avg pace >> stopped due to  Back pain with sats still 94% and min sob off all resp rx  - Echo  04/11/21   1. Left ventricular ejection fraction, by estimation, is 60 to 65%. The  left ventricle has normal function. The left ventricle has no regional  wall motion abnormalities. Left ventricular diastolic parameters are  consistent with Grade II diastolic  dysfunction (pseudonormalization).  2. Right ventricular systolic function is normal. The right ventricular  size is normal. There is mildly elevated pulmonary artery systolic  pressure.  3. Left atrial size was mildly dilated.  4. The mitral valve is normal in structure. No evidence of mitral valve  regurgitation. No evidence of mitral stenosis.  5. The aortic valve is normal in structure. Aortic valve regurgitation is  not visualized. Moderate aortic valve stenosis.  6. The inferior vena cava is normal in size with greater than 50%  respiratory variability, suggesting right atrial pressure of 3 mmHg.   Clearly this problem is multifactorial but the above findings may be pertinent, esp given the small  bilateral pleural effusions reported on prior cxr  >>> Reviewed echo data, f/u cards as planned

## 2021-06-28 DIAGNOSIS — I700 Atherosclerosis of aorta: Secondary | ICD-10-CM | POA: Diagnosis not present

## 2021-06-28 DIAGNOSIS — I10 Essential (primary) hypertension: Secondary | ICD-10-CM | POA: Diagnosis not present

## 2021-06-28 DIAGNOSIS — I5032 Chronic diastolic (congestive) heart failure: Secondary | ICD-10-CM | POA: Diagnosis not present

## 2021-06-28 DIAGNOSIS — J449 Chronic obstructive pulmonary disease, unspecified: Secondary | ICD-10-CM | POA: Diagnosis not present

## 2021-06-28 DIAGNOSIS — R911 Solitary pulmonary nodule: Secondary | ICD-10-CM | POA: Diagnosis not present

## 2021-06-28 DIAGNOSIS — M810 Age-related osteoporosis without current pathological fracture: Secondary | ICD-10-CM | POA: Diagnosis not present

## 2021-06-28 DIAGNOSIS — Z6829 Body mass index (BMI) 29.0-29.9, adult: Secondary | ICD-10-CM | POA: Diagnosis not present

## 2021-07-04 ENCOUNTER — Telehealth: Payer: Self-pay | Admitting: Internal Medicine

## 2021-07-04 DIAGNOSIS — I350 Nonrheumatic aortic (valve) stenosis: Secondary | ICD-10-CM | POA: Diagnosis not present

## 2021-07-04 DIAGNOSIS — J449 Chronic obstructive pulmonary disease, unspecified: Secondary | ICD-10-CM | POA: Diagnosis not present

## 2021-07-04 NOTE — Telephone Encounter (Signed)
Dr. Melvyn Novas please Arnoldo Hooker, I didn't see in last OV notes where neb solutions were recommended?  Should patient continue taking Pulmicort and Duoneb 6 times a day?

## 2021-07-04 NOTE — Telephone Encounter (Signed)
Try change duoneb to bid with the pulmicort bid also and be sure to bring all meds/ inhalers to next ov as they don't appear to be correlating 100% with our med list.

## 2021-07-04 NOTE — Telephone Encounter (Signed)
ATC patient. LMTCB  Routing to [facility] triage since this is a [place] patient.

## 2021-07-05 NOTE — Telephone Encounter (Signed)
ATC daughter Baker Janus. LMTCB with [facility] and [place] office numbers

## 2021-07-06 MED ORDER — IPRATROPIUM-ALBUTEROL 0.5-2.5 (3) MG/3ML IN SOLN: 3.0000 mL | RESPIRATORY_TRACT | 3 refills | Status: DC | PRN

## 2021-07-06 MED ORDER — BUDESONIDE 0.25 MG/2ML IN SUSP
0.2500 mg | Freq: Two times a day (BID) | RESPIRATORY_TRACT | 3 refills | Status: DC
Start: 2021-07-06 — End: 2021-08-14

## 2021-07-06 NOTE — Telephone Encounter (Signed)
Patients daughter returned call. Went over recs with her and she voiced understanding. Asked for refills on both neb solutions. Refills sent. Nothing further needed.

## 2021-07-08 DIAGNOSIS — R0602 Shortness of breath: Secondary | ICD-10-CM | POA: Diagnosis not present

## 2021-07-08 DIAGNOSIS — I5033 Acute on chronic diastolic (congestive) heart failure: Secondary | ICD-10-CM | POA: Diagnosis not present

## 2021-07-08 DIAGNOSIS — J449 Chronic obstructive pulmonary disease, unspecified: Secondary | ICD-10-CM | POA: Diagnosis not present

## 2021-07-08 DIAGNOSIS — Z9981 Dependence on supplemental oxygen: Secondary | ICD-10-CM | POA: Diagnosis not present

## 2021-07-08 DIAGNOSIS — J90 Pleural effusion, not elsewhere classified: Secondary | ICD-10-CM | POA: Diagnosis not present

## 2021-07-08 DIAGNOSIS — I350 Nonrheumatic aortic (valve) stenosis: Secondary | ICD-10-CM | POA: Diagnosis not present

## 2021-07-08 DIAGNOSIS — I110 Hypertensive heart disease with heart failure: Secondary | ICD-10-CM | POA: Diagnosis not present

## 2021-07-08 DIAGNOSIS — J9601 Acute respiratory failure with hypoxia: Secondary | ICD-10-CM | POA: Diagnosis not present

## 2021-07-08 DIAGNOSIS — E039 Hypothyroidism, unspecified: Secondary | ICD-10-CM | POA: Diagnosis not present

## 2021-07-08 DIAGNOSIS — R0789 Other chest pain: Secondary | ICD-10-CM | POA: Diagnosis not present

## 2021-07-08 DIAGNOSIS — E785 Hyperlipidemia, unspecified: Secondary | ICD-10-CM | POA: Diagnosis not present

## 2021-07-09 DIAGNOSIS — I509 Heart failure, unspecified: Secondary | ICD-10-CM | POA: Diagnosis not present

## 2021-07-09 DIAGNOSIS — I491 Atrial premature depolarization: Secondary | ICD-10-CM | POA: Diagnosis not present

## 2021-07-09 DIAGNOSIS — R0602 Shortness of breath: Secondary | ICD-10-CM | POA: Diagnosis not present

## 2021-07-10 DIAGNOSIS — I110 Hypertensive heart disease with heart failure: Secondary | ICD-10-CM | POA: Diagnosis not present

## 2021-07-10 DIAGNOSIS — E039 Hypothyroidism, unspecified: Secondary | ICD-10-CM | POA: Diagnosis not present

## 2021-07-10 DIAGNOSIS — J441 Chronic obstructive pulmonary disease with (acute) exacerbation: Secondary | ICD-10-CM | POA: Diagnosis not present

## 2021-07-10 DIAGNOSIS — K219 Gastro-esophageal reflux disease without esophagitis: Secondary | ICD-10-CM | POA: Diagnosis not present

## 2021-07-10 DIAGNOSIS — R079 Chest pain, unspecified: Secondary | ICD-10-CM | POA: Diagnosis not present

## 2021-07-10 DIAGNOSIS — R0600 Dyspnea, unspecified: Secondary | ICD-10-CM | POA: Diagnosis not present

## 2021-07-10 DIAGNOSIS — I5030 Unspecified diastolic (congestive) heart failure: Secondary | ICD-10-CM | POA: Diagnosis not present

## 2021-07-10 DIAGNOSIS — I5033 Acute on chronic diastolic (congestive) heart failure: Secondary | ICD-10-CM | POA: Diagnosis not present

## 2021-07-10 DIAGNOSIS — J9601 Acute respiratory failure with hypoxia: Secondary | ICD-10-CM | POA: Diagnosis not present

## 2021-07-11 DIAGNOSIS — R079 Chest pain, unspecified: Secondary | ICD-10-CM | POA: Diagnosis not present

## 2021-07-11 DIAGNOSIS — E039 Hypothyroidism, unspecified: Secondary | ICD-10-CM | POA: Diagnosis not present

## 2021-07-11 DIAGNOSIS — J9601 Acute respiratory failure with hypoxia: Secondary | ICD-10-CM | POA: Diagnosis not present

## 2021-07-11 DIAGNOSIS — I5030 Unspecified diastolic (congestive) heart failure: Secondary | ICD-10-CM | POA: Diagnosis not present

## 2021-07-11 DIAGNOSIS — I5033 Acute on chronic diastolic (congestive) heart failure: Secondary | ICD-10-CM | POA: Diagnosis not present

## 2021-07-11 DIAGNOSIS — R0600 Dyspnea, unspecified: Secondary | ICD-10-CM | POA: Diagnosis not present

## 2021-07-11 DIAGNOSIS — I110 Hypertensive heart disease with heart failure: Secondary | ICD-10-CM | POA: Diagnosis not present

## 2021-07-11 DIAGNOSIS — J441 Chronic obstructive pulmonary disease with (acute) exacerbation: Secondary | ICD-10-CM | POA: Diagnosis not present

## 2021-07-11 DIAGNOSIS — K219 Gastro-esophageal reflux disease without esophagitis: Secondary | ICD-10-CM | POA: Diagnosis not present

## 2021-07-13 ENCOUNTER — Encounter: Payer: Self-pay | Admitting: Internal Medicine

## 2021-07-13 ENCOUNTER — Ambulatory Visit: Payer: Medicare HMO | Admitting: Internal Medicine

## 2021-07-13 ENCOUNTER — Ambulatory Visit (INDEPENDENT_AMBULATORY_CARE_PROVIDER_SITE_OTHER): Payer: Medicare HMO

## 2021-07-13 DIAGNOSIS — R0602 Shortness of breath: Secondary | ICD-10-CM | POA: Diagnosis not present

## 2021-07-13 DIAGNOSIS — J9611 Chronic respiratory failure with hypoxia: Secondary | ICD-10-CM | POA: Diagnosis not present

## 2021-07-13 DIAGNOSIS — J9612 Chronic respiratory failure with hypercapnia: Secondary | ICD-10-CM

## 2021-07-13 DIAGNOSIS — E785 Hyperlipidemia, unspecified: Secondary | ICD-10-CM | POA: Diagnosis not present

## 2021-07-13 DIAGNOSIS — I130 Hypertensive heart and chronic kidney disease with heart failure and stage 1 through stage 4 chronic kidney disease, or unspecified chronic kidney disease: Secondary | ICD-10-CM | POA: Diagnosis not present

## 2021-07-13 DIAGNOSIS — J449 Chronic obstructive pulmonary disease, unspecified: Secondary | ICD-10-CM | POA: Diagnosis not present

## 2021-07-13 DIAGNOSIS — R0609 Other forms of dyspnea: Secondary | ICD-10-CM | POA: Diagnosis not present

## 2021-07-13 DIAGNOSIS — J90 Pleural effusion, not elsewhere classified: Secondary | ICD-10-CM | POA: Diagnosis not present

## 2021-07-13 DIAGNOSIS — N179 Acute kidney failure, unspecified: Secondary | ICD-10-CM | POA: Diagnosis not present

## 2021-07-13 DIAGNOSIS — Z8673 Personal history of transient ischemic attack (TIA), and cerebral infarction without residual deficits: Secondary | ICD-10-CM | POA: Diagnosis not present

## 2021-07-13 DIAGNOSIS — N184 Chronic kidney disease, stage 4 (severe): Secondary | ICD-10-CM | POA: Diagnosis not present

## 2021-07-13 DIAGNOSIS — R058 Other specified cough: Secondary | ICD-10-CM | POA: Diagnosis not present

## 2021-07-13 DIAGNOSIS — I5032 Chronic diastolic (congestive) heart failure: Secondary | ICD-10-CM | POA: Diagnosis not present

## 2021-07-13 NOTE — Progress Notes (Unsigned)
Annette Huang, female    DOB: 02/18/1927,   MRN: [id number]   Brief patient profile:  33 yowf  Quit smoking  06/2021  referred to pulmonary clinic 08/05/2018 by Dr  Lin Landsman for eval of cough/ sob ? All copd related though note last pfts on record >>>02/17/17    FEV1 1.35 [118%], ratio 0.82 with 14% resp to saba and TLC 93%, DLCO 73%     History of Present Illness  08/05/2018  Pulmonary/ 1st office eval/Annette Huang  - stopped vasotec/anoro prior to ov  Chief Complaint  Patient presents with   Pulmonary Consult    Referred by Dr Lovette Cliche for eval of COPD. Pt c/o increased cough and SOB for the past few wks.   Dyspnea:  Can lean on buggy [employer] = MMRC2 = can't walk a nl pace on a flat grade s sob but does fine slow and flat  Cough: some throat congestion  Sleep: wedge x 30 degrees  And 2lpm at hs  SABA use: duoneb 2-3 per day / was only taking prn anoro prior to stopping it completely Cough some better with prednisone but never compltely free of sense of throat congestion Has 02 not using x at hs 2lpm rec Continue off anoro and vasotec as you are  Pantoprazole (protonix) 40 mg   Take  30-60 min before first meal of the day and Pepcid (famotidine)  20 mg one  After supper  until return to office - this is the best way to tell whether stomach acid is contributing to your problem.  GERD diet  Only use your albuterol-ipatropium  as a rescue medication   Wear 2lpm at bedtime and during the day as needed to keep your saturation above 90% and adjust the flow to keep it over 90% at all times to help your heart deliver 0xygen without having to strain.    09/01/2018  f/u ov/Annette Huang re: doe/ min copd clinically  Chief Complaint  Patient presents with   Follow-up    Breathing is overall doing well and she has not used her neb or o2 recently.   Dyspnea:  Better but not very active Cough: none / throat congetstion is better  Sleeping: able to lie flat one pillow SABA use: none  02: just sometimes  at hs / not checking sats with activity  rec Wear 02 at bedtime  X 2lpm and as needed during the day for short of breath or 02 level less than 90% Nebulizer is only as needed    06/25/2021  f/u ov/Annette Huang re: doe / min copd   maint on nothing has neb prn and started April 20th  p ? URI > changed neb to qid  - now on  fosamax with prominent pseudowheeze  Chief Complaint  Patient presents with   Follow-up    DOE  Dyspnea:  not back to baseline  Cough: not much  Sleeping: wedges doing fine  SABA use:qid neb 02: 2lpm bedtime and 2lpm prn daytime Covid status:   JJ Rec Stop fosamax  the alternatives are Prolia injection (shots twice a year or relclast infusion (once a year)  Pantoprazole (protonix) 40 mg   Take  30-60 min before first meal of the day and Pepcid (famotidine)  20 mg after supper until return to office - this is the best way to tell whether stomach acid is contributing to your problem.   GERD rx Make sure you check your oxygen saturation  AT  your highest level of activity (not after you stop)     07/08/21  ER eval for SOB/ leg swelling     07/13/2021  f/u ov/Annette Huang/ [facility] re: AS/copd/ hypoxemic /hypercarbic resp failure maint on qid duoneb/ budesonidebid   Chief Complaint  Patient presents with   Follow-up    SOB and small amount of cough   Dyspnea:  100 ft / hc parking  Cough: none  Sleeping: able to down flatter  SABA use: qid duoneb plus bid pulmocort 02: 2 lpm and prn daytime  Swallowing ok/ cc "indigestion" chronically  Has AS but refused to consider TAVR per daughter     No obvious day to day or daytime variability or assoc excess/ purulent sputum or mucus plugs or hemoptysis or cp or chest tightness, subjective wheeze or overt sinus or hb symptoms.   Sleeping better  without nocturnal  or early am exacerbation  of respiratory  c/o's or need for noct saba. Also denies any obvious fluctuation of symptoms with weather or environmental changes or other  aggravating or alleviating factors except as outlined above   No unusual exposure hx or h/o childhood pna/ asthma or knowledge of premature birth.  Current Allergies, Complete Past Medical History, Past Surgical History, Family History, and Social History were reviewed in [employer] record.  ROS  The following are not active complaints unless bolded Hoarseness, sore throat, dysphagia, dental problems, itching, sneezing,  nasal congestion or discharge of excess mucus or purulent secretions, ear ache,   fever, chills, sweats, unintended wt loss or wt gain, classically pleuritic or exertional cp,  orthopnea pnd or arm/hand swelling  or leg swelling, presyncope, palpitations, abdominal pain, anorexia, nausea, vomiting, diarrhea  or change in bowel habits or change in bladder habits, change in stools or change in urine, dysuria, hematuria,  rash, arthralgias, visual complaints, headache, numbness, weakness or ataxia or problems with walking or coordination,  change in mood or  memory.        Current Meds - - NOTE:   Unable to verify as accurately reflecting what pt takes    Medication Sig   albuterol (VENTOLIN HFA) 108 (90 Base) MCG/ACT inhaler Inhale 1 puff into the lungs as needed for wheezing or shortness of breath.   atorvastatin (LIPITOR) 40 MG tablet TAKE 1 TABLET BY MOUTH EVERY DAY   budesonide (PULMICORT) 0.25 MG/2ML nebulizer solution Take 2 mLs (0.25 mg total) by nebulization 2 (two) times daily.   clopidogrel (PLAVIX) 75 MG tablet Take 75 mg by mouth daily.    Cyanocobalamin (VITAMIN B12 PO) Take 1 tablet by mouth daily. Unknown strenght   famotidine (PEPCID) 20 MG tablet One after supper   furosemide (LASIX) 40 MG tablet Take 40 mg daily by mouth.   ipratropium-albuterol (DUONEB) 0.5-2.5 (3) MG/3ML SOLN Take 3 mLs by nebulization every 4 (four) hours as needed (Wheezing  and SOB).   levofloxacin (LEVAQUIN) 250 MG tablet Take by mouth.   levothyroxine (SYNTHROID,  LEVOTHROID) 50 MCG tablet Take 1 tablet daily by mouth.   metoprolol tartrate (LOPRESSOR) 25 MG tablet TAKE 1 TABLET BY MOUTH 2 TIMES DAILY.   OXYGEN Inhale 2 L into the lungs as needed (Mainly at bedtime). 2lpm as needed   pantoprazole (PROTONIX) 40 MG tablet Take 40 mg by mouth daily.   potassium chloride (MICRO-K) 10 MEQ CR capsule TAKE 1 CAPSULE TWICE DAILY (Patient taking differently: Take 10 mEq by mouth 2 (two) times daily.)  Vitamin D, Ergocalciferol, (DRISDOL) 1.25 MG (50000 UT) CAPS capsule Take 50,000 Units by mouth 2 (two) times a week.                     Past Medical History:  Diagnosis Date   COPD (chronic obstructive pulmonary disease) ([place])    Coronary artery disease    CVA (cerebral vascular accident) ([place])    Hyperlipidemia    Hypertension    Thoracic aortic atherosclerosis ([facility])       Objective:      07/13/2021         145    06/25/21 144 lb 9.6 oz (65.6 kg)  04/18/21 142 lb 6.4 oz (64.6 kg)  10/13/20 142 lb 9.6 oz (64.7 kg)    Vital signs reviewed  07/13/2021  - Note at rest 02 sats  93% on RA   General appearance:    elderly amb wf nad / prominent pseudowheeze    HEENT : Oropharynx  clear   Nasal turbinates nl    NECK :  without  apparent JVD/ palpable Nodes/TM    LUNGS: no acc muscle use,  Min barrel  contour chest wall with bilateral  slightly decreased bs s audible wheeze and  without cough on insp or exp maneuvers and min  Hyperresonant  to  percussion bilaterally    CV:  RRR  no s3 or 2-3/6 SEM or increase in P2, and no edema   ABD:  soft and nontender with pos end  insp Hoover's  in the supine position.  No bruits or organomegaly appreciated   MS:  Nl gait/ ext warm without deformities Or obvious joint restrictions  calf tenderness, cyanosis or clubbing     SKIN: warm and dry without lesions    NEURO:  alert, approp, nl sensorium with  no motor or cerebellar deficits apparent.            CXR PA and Lateral:   07/13/2021 :    I  personally reviewed images and impression is as follows:     Cm/ vasc congestion s overt alv edema/ effusions          Assessment

## 2021-07-13 NOTE — Patient Instructions (Addendum)
Stay on the same nebulizer for  now   Make sure you check your oxygen saturation  AT  your highest level of activity (not after you stop)   to be sure it stays over 90% and adjust  02 flow upward to maintain this level if needed but remember to turn it back to previous settings when you stop (to conserve your supply).   I will be referring to ENT in [facility] for the throat wheezing you are experiencing  Please remember to go to the  x-ray department  for your tests - we will call you with the results when they are available    Return as previously shedules with all medications/ inhalers/ solutions

## 2021-07-14 ENCOUNTER — Encounter: Payer: Self-pay | Admitting: Internal Medicine

## 2021-07-14 DIAGNOSIS — I5030 Unspecified diastolic (congestive) heart failure: Secondary | ICD-10-CM | POA: Diagnosis not present

## 2021-07-14 DIAGNOSIS — J9611 Chronic respiratory failure with hypoxia: Secondary | ICD-10-CM | POA: Insufficient documentation

## 2021-07-14 DIAGNOSIS — R7989 Other specified abnormal findings of blood chemistry: Secondary | ICD-10-CM | POA: Diagnosis not present

## 2021-07-14 DIAGNOSIS — J9612 Chronic respiratory failure with hypercapnia: Secondary | ICD-10-CM

## 2021-07-14 DIAGNOSIS — R3589 Other polyuria: Secondary | ICD-10-CM | POA: Diagnosis not present

## 2021-07-14 DIAGNOSIS — N189 Chronic kidney disease, unspecified: Secondary | ICD-10-CM | POA: Diagnosis not present

## 2021-07-14 HISTORY — DX: Chronic respiratory failure with hypoxia: J96.12

## 2021-07-14 NOTE — Assessment & Plan Note (Signed)
02 hs x 2lpm as of 08/05/2018 and prn daytime to keep sats > 90%  - 09/01/2018 no desats walking / limited by back  - HC03   07/11/21    = 32  (see hypoxemic/hypercarbic RF)   As of 07/13/2021  2lpm hs and titrate daytime to keep around 90%

## 2021-07-14 NOTE — Assessment & Plan Note (Addendum)
Quit smoking 06/2021  with emphysema on ct from 06/12/2018 - PFT 02/17/17    FEV1 1.35 [118%], ratio 0.82 with 14% resp to saba and TLC 93%, DLCO 73% - Echo 01/20/2018 c/w mod AS s PH - pt stopped vasotec and anoro on her own prior to pulmonary ov 08/05/2018 > rec leave off and rx for gerd x 4 weeks > improved as of 09/01/2018  - 09/01/2018   Walked RA x one lap =  approx 250 ft avg pace >> stopped due to  Back pain with sats still 94% and min sob off all resp rx  - Echo  04/11/21   1. Left ventricular ejection fraction, by estimation, is 60 to 65%. The  left ventricle has normal function. The left ventricle has no regional  wall motion abnormalities. Left ventricular diastolic parameters are  consistent with Grade II diastolic  dysfunction (pseudonormalization).  2. Right ventricular systolic function is normal. The right ventricular  size is normal. There is mildly elevated pulmonary artery systolic  pressure.  3. Left atrial size was mildly dilated.  4. The mitral valve is normal in structure. No evidence of mitral valve  regurgitation. No evidence of mitral stenosis.  5. The aortic valve is normal in structure. Aortic valve regurgitation is  not visualized. Moderate aortic valve stenosis.  6. The inferior vena cava is normal in size with greater than 50%  respiratory variability, suggesting right atrial pressure of 3 mmHg.  - Spirometry 07/13/2021   FEV1 0.8 (76%)  Ratio 0.62 > 4 h since duoneb  Clearly multifactorial but does have a component of copd - also has significant AS and pseudowheezing on exam despite max reported gerd rx so needs to continue gerd rx/ just use duoneb for "maint rx" for now and avoid further systemic steroids as has tendency to rapid fluid retention per daugher.  Already being managed with increased diuretics by [facility] but if not effective offered admission to our system to keep under one room between cards /pulmonary or establish with [facility] pulmonary.   Return as prev  scheduled/call sooner if needed/ with all meds in hand using a trust but verify approach to confirm accurate Medication  Reconciliation The principal here is that until we are certain that the  patients are doing what we've asked, it makes no sense to ask them to do more.          Each maintenance medication was reviewed in detail including emphasizing most importantly the difference between maintenance and prns and under what circumstances the prns are to be triggered using an action plan format where appropriate.  Total time for H and P, chart review, counseling, reviewing neb/02 device(s) and generating customized AVS unique to this post hosp office visit / same day charting > 30 min for multiple  refractory respiratory  symptoms of uncertain etiology

## 2021-07-14 NOTE — Assessment & Plan Note (Signed)
Quit smoking 06/2021  - - Spirometry 07/13/2021   FEV1 0.8 (76%)  Ratio 0.62 > 4 h since duoneb  Continue duoneb qid plus pulmocort 0.25 mg bid - ideally needs laba/lama neb but not able to afford the 20% copay at this point.

## 2021-07-16 DIAGNOSIS — N3281 Overactive bladder: Secondary | ICD-10-CM | POA: Diagnosis not present

## 2021-07-16 DIAGNOSIS — N390 Urinary tract infection, site not specified: Secondary | ICD-10-CM | POA: Diagnosis not present

## 2021-07-16 DIAGNOSIS — I5030 Unspecified diastolic (congestive) heart failure: Secondary | ICD-10-CM | POA: Diagnosis not present

## 2021-07-16 DIAGNOSIS — N952 Postmenopausal atrophic vaginitis: Secondary | ICD-10-CM | POA: Diagnosis not present

## 2021-07-16 DIAGNOSIS — I130 Hypertensive heart and chronic kidney disease with heart failure and stage 1 through stage 4 chronic kidney disease, or unspecified chronic kidney disease: Secondary | ICD-10-CM | POA: Diagnosis not present

## 2021-07-16 DIAGNOSIS — R7989 Other specified abnormal findings of blood chemistry: Secondary | ICD-10-CM | POA: Diagnosis not present

## 2021-07-16 DIAGNOSIS — N189 Chronic kidney disease, unspecified: Secondary | ICD-10-CM | POA: Diagnosis not present

## 2021-07-16 DIAGNOSIS — R3589 Other polyuria: Secondary | ICD-10-CM | POA: Diagnosis not present

## 2021-07-16 NOTE — Progress Notes (Signed)
Spoke with pt's daughter, ok per DPR.  Pt verbalized understanding and denied any questions.

## 2021-07-16 NOTE — Progress Notes (Addendum)
Cardiology Office Note    Date:  07/18/2021   ID:  Annette, Huang 1927/09/24, MRN [id number]   PCP:  Angelina Sheriff, MD   [place]  Cardiologist:  Jenne Campus, MD   Advanced Practice Provider:  No care team member to display Electrophysiologist:  None   [phone]   Chief Complaint  Patient presents with   Hospitalization Follow-up    History of Present Illness:  Annette Huang is a 88 y.o. female  with past medical history significant for aortic stenosis which is moderate, advanced COPD, she is a chronic smoker and still continues to smoke, carotid arterial disease, essential hypertension, dyslipidemia.   Patient last saw Dr. Agustin Cree 04/2021 and aortic stenosis was unchanged. LVEF 60-65% grade 2 DD, moderate AS. She has chronic shortness of breath felt related to her COPD and continues to smoke.  Is on O2 since admission at [facility] for COPD exacerbation-was put on prednisone-sent home with 24 tablets. Since then she has developed fluid overload.  She went to the ED at [facility] 07/08/2021 for shortness of breath and chest tightness.  She took 1 extra Lasix prior to going to the ED.  Troponins were normal. BNP 133, Crt  1.77 She diuresed with IV Lasix once.  Admission weight 150 pounds when she left the hospital 142 pounds Lasix was increased to 60 mg daily for 1 week then 40 mg daily. Labs 07/17/21 K 5.7, Crt 2.74. had [facility] and lasix held b/c of AKI and trimethoprim decreased 50 mg daily. Kayexalate given.  Weights at home past 3 days 144-145.weight here 147.  Has held lasix 2-3 days  Past Medical History:  Diagnosis Date   Aortic stenosis 11/09/2014   Overview:  Mild in 2015 Moderate to severe in the October 2018   Chronic respiratory failure with hypoxia (HCC) 08/06/2018   02 hs x 2lpm as of 08/05/2018 and prn daytime to keep sats > 90%  - 09/01/2018 no desats walking / limited by back    COPD (chronic obstructive  pulmonary disease) (HCC)    Coronary artery disease    CVA (cerebral vascular accident) ([place])    Diastolic congestive heart failure ([place]) 07/31/2018   DOE (dyspnea on exertion) 12/11/2016   Quit smoking 07/22/18 with emphysema on ct from 06/12/2018 - PFT 02/17/17    FEV1 1.35 [118%], ratio 0.82 with 14% resp to saba and TLC 93%, DLCO 73% - Echo 01/20/2018 c/w mod AS s PH - pt stopped vasotec and anoro on her own prior to pulmonary ov 08/05/2018 > rec leave off and rx for gerd x 4 weeks > improved as of 09/01/2018  - 09/01/2018   Walked RA x one lap =  approx 250 ft avg pace >> stopped due to   Essential hypertension 08/18/2013   rec Off acei permanently as of 08/05/2018 due to pseudowheeze    Greater trochanteric bursitis of right hip 11/13/2017   Hyperlipemia 08/18/2013   Hyperlipidemia    Hypertension    Late effects of CVA (cerebrovascular accident) 11/09/2014   Lumbar degenerative disc disease 06/18/2013   Lumbar spondylosis 06/18/2013   Peripheral vascular disease, unspecified ([place]) status post right carotic endarterectomy 08/28/2018   Sacroiliac inflammation ([place]) 11/13/2017   Sacroiliitis ([place]) 11/13/2017   Smoking 05/23/2015   Spinal stenosis of lumbar region with neurogenic claudication 08/18/2013   Stroke ([place]) 09/08/2013   Thoracic aortic atherosclerosis ([place])     Past Surgical History:  Procedure Laterality Date   CAROTID ENDARTERECTOMY Bilateral    NECK SURGERY      Current Medications: Current Meds  Medication Sig   albuterol (VENTOLIN HFA) 108 (90 Base) MCG/ACT inhaler Inhale 1 puff into the lungs as needed for wheezing or shortness of breath.   ALPRAZolam (XANAX) 0.25 MG tablet Take 0.25 mg by mouth daily as needed.   amLODipine (NORVASC) 5 MG tablet Take 5 mg by mouth daily.   atorvastatin (LIPITOR) 40 MG tablet TAKE 1 TABLET BY MOUTH EVERY DAY   budesonide (PULMICORT) 0.25 MG/2ML nebulizer solution Take 2 mLs (0.25 mg total) by nebulization 2 (two) times daily.   clopidogrel  (PLAVIX) 75 MG tablet Take 75 mg by mouth daily.    Cyanocobalamin (VITAMIN B12 PO) Take 1 tablet by mouth daily. Unknown strenght   famotidine (PEPCID) 20 MG tablet One after supper   furosemide (LASIX) 40 MG tablet Take 40 mg daily by mouth.   ipratropium-albuterol (DUONEB) 0.5-2.5 (3) MG/3ML SOLN Take 3 mLs by nebulization every 4 (four) hours as needed (Wheezing  and SOB).   Lactobacillus Rhamnosus, GG, (CULTURELLE) CAPS Take 1 capsule by mouth daily.   levothyroxine (SYNTHROID, LEVOTHROID) 50 MCG tablet Take 1 tablet daily by mouth.   lidocaine (LIDODERM) 5 % as needed for pain.   magnesium oxide (MAG-OX) 400 (240 Mg) MG tablet Take 1 tablet by mouth 2 (two) times daily.   magnesium oxide (MAG-OX) 400 MG tablet Take by mouth 2 (two) times daily.   metoprolol tartrate (LOPRESSOR) 25 MG tablet TAKE 1 TABLET BY MOUTH 2 TIMES DAILY.   OXYGEN Inhale 2 L into the lungs as needed (Mainly at bedtime). 2lpm as needed   pantoprazole (PROTONIX) 40 MG tablet Take 40 mg by mouth daily.   potassium chloride (MICRO-K) 10 MEQ CR capsule Take 1 capsule (10 mEq total) by mouth 2 (two) times daily.   traZODone (DESYREL) 50 MG tablet Take 50 mg by mouth as needed for sleep.   trimethoprim (TRIMPEX) 100 MG tablet Take 50 mg by mouth daily.   Vitamin D, Ergocalciferol, (DRISDOL) 1.25 MG (50000 UT) CAPS capsule Take 50,000 Units by mouth 2 (two) times a week.   [DISCONTINUED] amLODipine (NORVASC) 2.5 MG tablet Take 1 tablet (2.5 mg total) by mouth daily.     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  Tobacco Use   Smoking status: Former    Packs/day: 1.00    Years: 35.00    Total pack years: 35.00    Types: Cigarettes    Quit date: 2023    Years since  quitting: 0.4   Smokeless tobacco: Never  Vaping Use   Vaping Use: Former  Substance and Sexual Activity   Alcohol use: No   Drug use: No   Sexual activity: Not on file  Other Topics Concern   Not on file  Social History Narrative   Not on file   Social Determinants of Health   Financial Resource Strain: Not on file  Food Insecurity: Not on file  Transportation Needs: Not on file  Physical Activity: Not on file  Stress: Not on file  Social Connections: Not on file  Family History:  The patient's  family history includes Heart disease in her father and mother; Hypertension in her mother.   ROS:   Please see the history of present illness.    ROS All other systems reviewed and are negative.   PHYSICAL EXAM:   VS:  BP (!) 116/58   Pulse 75   Ht '4\' 11"'$  (1.499 m)   Wt 147 lb (66.7 kg)   SpO2 97%   BMI 29.69 kg/m   Physical Exam  GEN: Well nourished, well developed, in no acute distress on O2  Neck: no JVD, carotid bruits, or masses Cardiac:RRR; 2/6 systolic murmur LSB  Respiratory:  bibasilar crackles GI: soft, nontender, nondistended, + BS Ext: without cyanosis, clubbing, or edema, Good distal pulses bilaterally Neuro:  Alert and Oriented x 3,  Psych: euthymic mood, full affect  Wt Readings from Last 3 Encounters:  07/18/21 147 lb (66.7 kg)  07/13/21 145 lb 3.2 oz (65.9 kg)  06/25/21 144 lb 9.6 oz (65.6 kg)      Studies/Labs Reviewed:   EKG:  EKG is  ordered today.  The ekg ordered today demonstrates NSR with IRBBB, LAFB old septal infarct  Recent Labs: No results found for requested labs within last 365 days.   Lipid Panel    Component Value Date/Time   CHOL 132 09/14/2019 1006   TRIG 59 09/14/2019 1006   HDL 54 09/14/2019 1006   CHOLHDL 2.4 09/14/2019 1006   LDLCALC 65 09/14/2019 1006    Additional studies/ records that were reviewed today include:   Echo 04/2021   IMPRESSIONS     1. Left ventricular ejection fraction, by estimation, is  60 to 65%. The  left ventricle has normal function. The left ventricle has no regional  wall motion abnormalities. Left ventricular diastolic parameters are  consistent with Grade II diastolic  dysfunction (pseudonormalization).   2. Right ventricular systolic function is normal. The right ventricular  size is normal. There is mildly elevated pulmonary artery systolic  pressure.   3. Left atrial size was mildly dilated.   4. The mitral valve is normal in structure. No evidence of mitral valve  regurgitation. No evidence of mitral stenosis.   5. The aortic valve is normal in structure. Aortic valve regurgitation is  not visualized. Moderate aortic valve stenosis.   6. The inferior vena cava is normal in size with greater than 50%  respiratory variability, suggesting right atrial pressure of 3 mmHg.   FINDINGS   Left Ventricle: Left ventricular ejection fraction, by estimation, is 60  to 65%. The left ventricle has normal function. The left ventricle has no  regional wall motion abnormalities. The left ventricular internal cavity  size was normal in size. There is   borderline left ventricular hypertrophy. Left ventricular diastolic  parameters are consistent with Grade II diastolic dysfunction  (pseudonormalization).   Right Ventricle: The right ventricular size is normal. No increase in  right ventricular wall thickness. Right ventricular systolic function is  normal. There is mildly elevated pulmonary artery systolic pressure. The  tricuspid regurgitant velocity is 2.89   m/s, and with an assumed right atrial pressure of 3 mmHg, the estimated  right ventricular systolic pressure is 01.7 mmHg.   Left [facility]: Left atrial size was mildly dilated.   Right [facility]: Right atrial size was normal in size.   Pericardium: There is no evidence of pericardial effusion.   Mitral Valve: The mitral valve is normal in structure. Mild mitral annular  calcification. No  evidence of mitral valve  regurgitation. No evidence of  mitral valve stenosis. MV peak gradient, 10.0 mmHg. The mean mitral valve  gradient is 4.5 mmHg.   Tricuspid Valve: The tricuspid valve is normal in structure. Tricuspid  valve regurgitation is mild . No evidence of tricuspid stenosis.   Aortic Valve: The aortic valve is normal in structure. Aortic valve  regurgitation is not visualized. Moderate aortic stenosis is present.  Aortic valve mean gradient measures 21.0 mmHg. Aortic valve peak gradient  measures 36.5 mmHg. Aortic valve area, by  VTI measures 1.00 cm.   Pulmonic Valve: The pulmonic valve was normal in structure. Pulmonic valve  regurgitation is not visualized. No evidence of pulmonic stenosis.   Aorta: The aortic root is normal in size and structure.   Venous: The inferior vena cava is normal in size with greater than 50%  respiratory variability, suggesting right atrial pressure of 3 mmHg.   IAS/Shunts: No atrial level shunt detected by color flow Doppler.      Risk Assessment/Calculations:         ASSESSMENT:    1. Nonrheumatic aortic valve stenosis   2. Essential hypertension   3. AKI (acute kidney injury) ([place])   4. Other hyperlipidemia   5. COPD GOLD 2    6. Tobacco abuse      PLAN:  In order of problems listed above:  Moderate aortic stenosis on echo 04/2021 normal LVEF and G2DD.   Acute on Chronic diastolic CHF with recent emergency room visit 07/08/2021 requiring IV diuresis.  Discharge weight 142. She was put on steroids in May during hospitalization for COPD exacerbation. Since then she's had increase edema but developed AKI/hyperkalemia. [facility] and given kayexalate. Will repeat labs today. Some rales on exam but neck veins flat and no edema. Weight up some from hospital. Getting extra salt. Hasn't had lasix in 2 days. Await labs and make decision on lasix-for now take prn weight gain 2-3 lbs overnight or 5 lbs in a week.   Addendum: Dr. Ledora Bottcher called after seeing this patient 07/19/21. He said her creatanine was normal and BNP up so he restarted lasix 40 mg once daily. She'll see him back next week and I'll see her 07/31/21.  AKI with recent COPD exacerbation treated with steroids causing CHF and hospitalize requiring IV diuresis. Receive kayexalate last night and this am. Will repeat labs today and next week with [facility]. Refer to renal.  Hypertension BP stable  HLD on lipitor  COPD now on O2 since COPD exacerbation last month.  Carotid artery disease  Chronic tobacco use-quit smoking  Shared Decision Making/Informed Consent        Medication Adjustments/Labs and Tests Ordered: Current medicines are reviewed at length with the patient today.  Concerns regarding medicines are outlined above.  Medication changes, Labs and Tests ordered today are listed in the Patient Instructions below. Patient Instructions  Medication Instructions:  Your physician has recommended you make the following change in your medication:   Take Lasix (Furosemide) as needed for weight gain >2lbs overnight or >5lbs in a week.  *If you need a refill on your cardiac medications before your next appointment, please call your pharmacy*   Lab Work: TODAY: BMET, BNP (STAT) BMET next week by Alvis Lemmings RN at home  If you have labs (blood work) drawn today and your tests are completely normal, you will receive your results only by: [place] (if you have MyChart) OR A paper  copy in the mail If you have any lab test that is abnormal or we need to change your treatment, we will call you to review the results.   Follow-Up: At [facility], you and your health needs are our priority.  As part of our continuing mission to provide you with exceptional heart care, we have created designated Provider Care Teams.  These Care Teams include your primary Cardiologist (physician) and Advanced Practice Providers (APPs -  Physician Assistants and Nurse  Practitioners) who all work together to provide you with the care you need, when you need it.  We recommend signing up for the patient portal called "MyChart".  Sign up information is provided on this After Visit Summary.  MyChart is used to connect with patients for Virtual Visits (Telemedicine).  Patients are able to view lab/test results, encounter notes, upcoming appointments, etc.  Non-urgent messages can be sent to your provider as well.   To learn more about what you can do with MyChart, go to [email or web address].    Your next appointment:   2 week(s)  The format for your next appointment:   In Person  Provider:   Jenne Campus, MD{   Other Instructions Two Gram Sodium Diet 2000 mg  What is Sodium? Sodium is a mineral found naturally in many foods. The most significant source of sodium in the diet is table salt, which is about 40% sodium.  Processed, convenience, and preserved foods also contain a large amount of sodium.  The body needs only 500 mg of sodium daily to function,  A normal diet provides more than enough sodium even if you do not use salt.  Why Limit Sodium? A build up of sodium in the body can cause thirst, increased blood pressure, shortness of breath, and water retention.  Decreasing sodium in the diet can reduce edema and risk of heart attack or stroke associated with high blood pressure.  Keep in mind that there are many other factors involved in these health problems.  Heredity, obesity, lack of exercise, cigarette smoking, stress and what you eat all play a role.  General Guidelines: Do not add salt at the table or in cooking.  One teaspoon of salt contains over 2 grams of sodium. Read food labels Avoid processed and convenience foods Ask your dietitian before eating any foods not dicussed in the menu planning guidelines Consult your physician if you wish to use a salt substitute or a sodium containing medication such as antacids.  Limit milk and milk  products to 16 oz (2 cups) per day.  Shopping Hints: READ LABELS!! "Dietetic" does not necessarily mean low sodium. Salt and other sodium ingredients are often added to foods during processing.    Menu Planning Guidelines Food Group Choose More Often Avoid  Beverages (see also the milk group All fruit juices, low-sodium, salt-free vegetables juices, low-sodium carbonated beverages Regular vegetable or tomato juices, commercially softened water used for drinking or cooking  Breads and Cereals Enriched white, wheat, rye and pumpernickel bread, hard rolls and dinner rolls; muffins, cornbread and waffles; most dry cereals, cooked cereal without added salt; unsalted crackers and breadsticks; low sodium or homemade bread crumbs Bread, rolls and crackers with salted tops; quick breads; instant hot cereals; pancakes; commercial bread stuffing; self-rising flower and biscuit mixes; regular bread crumbs or cracker crumbs  Desserts and Sweets Desserts and sweets mad with mild should be within allowance Instant pudding mixes and cake mixes  Fats Butter or margarine; vegetable oils; unsalted salad dressings,  regular salad dressings limited to 1 Tbs; light, sour and heavy cream Regular salad dressings containing bacon fat, bacon bits, and salt pork; snack dips made with instant soup mixes or processed cheese; salted nuts  Fruits Most fresh, frozen and canned fruits Fruits processed with salt or sodium-containing ingredient (some dried fruits are processed with sodium sulfites        Vegetables Fresh, frozen vegetables and low- sodium canned vegetables Regular canned vegetables, sauerkraut, pickled vegetables, and others prepared in brine; frozen vegetables in sauces; vegetables seasoned with ham, bacon or salt pork  Condiments, Sauces, Miscellaneous  Salt substitute with physician's approval; pepper, herbs, spices; vinegar, lemon or lime juice; hot pepper sauce; garlic powder, onion powder, low sodium soy  sauce (1 Tbs.); low sodium condiments (ketchup, chili sauce, mustard) in limited amounts (1 tsp.) fresh ground horseradish; unsalted tortilla chips, pretzels, potato chips, popcorn, salsa (1/4 cup) Any seasoning made with salt including garlic salt, celery salt, onion salt, and seasoned salt; sea salt, rock salt, kosher salt; meat tenderizers; monosodium glutamate; mustard, regular soy sauce, barbecue, sauce, chili sauce, teriyaki sauce, steak sauce, Worcestershire sauce, and most flavored vinegars; canned gravy and mixes; regular condiments; salted snack foods, olives, picles, relish, horseradish sauce, catsup   Food preparation: Try these seasonings Meats:    Pork Sage, onion Serve with applesauce  Chicken Poultry seasoning, thyme, parsley Serve with cranberry sauce  Lamb Curry powder, rosemary, garlic, thyme Serve with mint sauce or jelly  Veal Marjoram, basil Serve with current jelly, cranberry sauce  Beef Pepper, bay leaf Serve with dry mustard, unsalted chive butter  Fish [place] leaf, dill Serve with unsalted lemon butter, unsalted parsley butter  Vegetables:    Asparagus Lemon juice   Broccoli Lemon juice   Carrots Mustard dressing parsley, mint, nutmeg, glazed with unsalted butter and sugar   Green beans Marjoram, lemon juice, nutmeg,dill seed   Tomatoes Basil, marjoram, onion   Spice /blend for [employer]" 4 tsp ground thyme 1 tsp ground sage 3 tsp ground rosemary 4 tsp ground marjoram   Test your knowledge A product that says "Salt Free" may still contain sodium. True or False Garlic Powder and Hot Pepper Sauce an be used as alternative seasonings.True or False Processed foods have more sodium than fresh foods.  True or False Canned Vegetables have less sodium than froze True or False   WAYS TO DECREASE YOUR SODIUM INTAKE Avoid the use of added salt in cooking and at the table.  Table salt (and other prepared seasonings which contain salt) is probably one of the greatest sources  of sodium in the diet.  Unsalted foods can gain flavor from the sweet, sour, and butter taste sensations of herbs and spices.  Instead of using salt for seasoning, try the following seasonings with the foods listed.  Remember: how you use them to enhance natural food flavors is limited only by your creativity... Allspice-Meat, fish, eggs, fruit, peas, red and yellow vegetables Almond Extract-Fruit baked goods Anise Seed-Sweet breads, fruit, carrots, beets, cottage cheese, cookies (tastes like licorice) Basil-Meat, fish, eggs, vegetables, rice, vegetables salads, soups, sauces Bay Leaf-Meat, fish, stews, poultry Burnet-Salad, vegetables (cucumber-like flavor) Caraway Seed-Bread, cookies, cottage cheese, meat, vegetables, cheese, rice Cardamon-Baked goods, fruit, soups Celery Powder or seed-Salads, salad dressings, sauces, meatloaf, soup, bread.Do not use  celery salt Chervil-Meats, salads, fish, eggs, vegetables, cottage cheese (parsley-like flavor) Chili Power-Meatloaf, chicken cheese, corn, eggplant, egg dishes Chives-Salads cottage cheese, egg dishes, soups, vegetables, sauces Cilantro-Salsa, casseroles Cinnamon-Baked goods, fruit,  pork, lamb, chicken, carrots Cloves-Fruit, baked goods, fish, pot roast, green beans, beets, carrots Coriander-Pastry, cookies, meat, salads, cheese (lemon-orange flavor) Cumin-Meatloaf, fish,cheese, eggs, cabbage,fruit pie (caraway flavor) [employer], fruit, eggs, fish, poultry, cottage cheese, vegetables Dill Seed-Meat, cottage cheese, poultry, vegetables, fish, salads, bread Fennel Seed-Bread, cookies, apples, pork, eggs, fish, beets, cabbage, cheese, Licorice-like flavor Garlic-(buds or powder) Salads, meat, poultry, fish, bread, butter, vegetables, potatoes.Do not  use garlic salt Ginger-Fruit, vegetables, baked goods, meat, fish, poultry Horseradish Root-Meet, vegetables, butter Lemon Juice or Extract-Vegetables, fruit, tea, baked goods, fish  salads Mace-Baked goods fruit, vegetables, fish, poultry (taste like nutmeg) Maple Extract-Syrups Marjoram-Meat, chicken, fish, vegetables, breads, green salads (taste like Sage) Mint-Tea, lamb, sherbet, vegetables, desserts, carrots, cabbage Mustard, Dry or Seed-Cheese, eggs, meats, vegetables, poultry Nutmeg-Baked goods, fruit, chicken, eggs, vegetables, desserts Onion Powder-Meat, fish, poultry, vegetables, cheese, eggs, bread, rice salads (Do not use   Onion salt) Orange Extract-Desserts, baked goods Oregano-Pasta, eggs, cheese, onions, pork, lamb, fish, chicken, vegetables, green salads Paprika-Meat, fish, poultry, eggs, cheese, vegetables Parsley Flakes-Butter, vegetables, meat fish, poultry, eggs, bread, salads (certain forms may   Contain sodium Pepper-Meat fish, poultry, vegetables, eggs Peppermint Extract-Desserts, baked goods Poppy Seed-Eggs, bread, cheese, fruit dressings, baked goods, noodles, vegetables, cottage  [employer], poultry, meat, fish, cauliflower, turnips,eggs bread Saffron-Rice, bread, veal, chicken, fish, eggs Sage-Meat, fish, poultry, onions, eggplant, tomateos, pork, stews Savory-Eggs, salads, poultry, meat, rice, vegetables, soups, pork Tarragon-Meat, poultry, fish, eggs, butter, vegetables (licorice-like flavor)  Thyme-Meat, poultry, fish, eggs, vegetables, (clover-like flavor), sauces, soups Tumeric-Salads, butter, eggs, fish, rice, vegetables (saffron-like flavor) Vanilla Extract-Baked goods, candy Vinegar-Salads, vegetables, meat marinades Walnut Extract-baked goods, candy   2. Choose your Foods Wisely   The following is a list of foods to avoid which are high in sodium:  Meats-Avoid all smoked, canned, salt cured, dried and kosher meat and fish as well as Anchovies   [employer] meats:Bologna, Liverwurst, Pastrami Canned meat or fish  Marinated herring Caviar    Pepperoni Corned  Beef   Pizza Dried chipped beef  Salami Frozen breaded fish or meat Salt pork Frankfurters or hot dogs  Sardines Gefilte fish   Sausage Ham (boiled ham, Proscuitto Smoked butt    spiced ham)   Spam      TV Dinners Vegetables Canned vegetables (Regular) Relish Canned mushrooms  Sauerkraut Olives    Tomato juice Pickles  Bakery and Dessert Products Canned puddings  Cream pies Cheesecake   Decorated cakes Cookies  Beverages/Juices Tomato juice, regular  Gatorade   V-8 vegetable juice, regular  Breads and Cereals Biscuit mixes   Salted potato chips, corn chips, pretzels Bread stuffing mixes  Salted crackers and rolls Pancake and waffle mixes Self-rising flour  Seasonings Accent    Meat sauces Barbecue sauce  Meat tenderizer Catsup    Monosodium glutamate (MSG) Celery salt   Onion salt Chili sauce   Prepared mustard Garlic salt   Salt, seasoned salt, sea salt Gravy mixes   Soy sauce Horseradish   Steak sauce Ketchup   Tartar sauce Lite salt    Teriyaki sauce Marinade mixes   Worcestershire sauce  Others Baking powder   Cocoa and cocoa mixes Baking soda   Commercial casserole mixes Candy-caramels, chocolate  Dehydrated soups    Bars, fudge,nougats  Instant rice and pasta mixes Canned broth or soup  Maraschino cherries Cheese, aged and processed cheese and cheese spreads  Learning Assessment Quiz  Indicated T (for True) or F (for  False) for each of the following statements:  _____ Fresh fruits and vegetables and unprocessed grains are generally low in sodium _____ Water may contain a considerable amount of sodium, depending on the source _____ You can always tell if a food is high in sodium by tasting it _____ Certain laxatives my be high in sodium and should be avoided unless prescribed   by a physician or pharmacist _____ Salt substitutes may be used freely by anyone on a sodium restricted diet _____ Sodium is present in table salt, food additives and as a natural  component of   most foods _____ Table salt is approximately 90% sodium _____ Limiting sodium intake may help prevent excess fluid accumulation in the body _____ On a sodium-restricted diet, seasonings such as bouillon soy sauce, and    cooking wine should be used in place of table salt _____ On an ingredient list, a product which lists monosodium glutamate as the first   ingredient is an appropriate food to include on a low sodium diet  Circle the best answer(s) to the following statements (Hint: there may be more than one correct answer)  11. On a low-sodium diet, some acceptable snack items are:    A. Olives  F. Bean dip   K. Grapefruit juice    B. Salted Pretzels G. Commercial Popcorn   L. Canned peaches    C. Carrot Sticks  H. Bouillon   M. Unsalted nuts   D. [place] fries  I. Peanut butter crackers N. Salami   E. Sweet pickles J. Tomato Juice   O. Pizza  12.  Seasonings that may be used freely on a reduced - sodium diet include   A. Lemon wedges F.Monosodium glutamate K. Celery seed    B.Soysauce   G. Pepper   L. Mustard powder   C. Sea salt  H. Cooking wine  M. Onion flakes   D. Vinegar  E. Prepared horseradish N. Salsa   E. Sage   J. Worcestershire sauce  O. [address]      Sumner Boast, PA-C  07/18/2021 11:28 AM    [facility] [address], [place], [place]  [address] Phone: [phone]; Fax: [phone]

## 2021-07-17 ENCOUNTER — Other Ambulatory Visit: Payer: Self-pay | Admitting: Cardiology

## 2021-07-17 DIAGNOSIS — M545 Low back pain, unspecified: Secondary | ICD-10-CM | POA: Diagnosis not present

## 2021-07-17 DIAGNOSIS — I5023 Acute on chronic systolic (congestive) heart failure: Secondary | ICD-10-CM | POA: Diagnosis not present

## 2021-07-17 DIAGNOSIS — N1832 Chronic kidney disease, stage 3b: Secondary | ICD-10-CM | POA: Diagnosis not present

## 2021-07-17 DIAGNOSIS — E875 Hyperkalemia: Secondary | ICD-10-CM | POA: Diagnosis not present

## 2021-07-17 DIAGNOSIS — I130 Hypertensive heart and chronic kidney disease with heart failure and stage 1 through stage 4 chronic kidney disease, or unspecified chronic kidney disease: Secondary | ICD-10-CM | POA: Diagnosis not present

## 2021-07-17 DIAGNOSIS — N179 Acute kidney failure, unspecified: Secondary | ICD-10-CM | POA: Diagnosis not present

## 2021-07-18 ENCOUNTER — Ambulatory Visit: Payer: Medicare HMO | Admitting: Physician Assistant

## 2021-07-18 ENCOUNTER — Encounter: Payer: Self-pay | Admitting: Physician Assistant

## 2021-07-18 VITALS — BP 116/58 | HR 75 | Ht 59.0 in | Wt 147.0 lb

## 2021-07-18 DIAGNOSIS — I10 Essential (primary) hypertension: Secondary | ICD-10-CM | POA: Diagnosis not present

## 2021-07-18 DIAGNOSIS — J449 Chronic obstructive pulmonary disease, unspecified: Secondary | ICD-10-CM | POA: Diagnosis not present

## 2021-07-18 DIAGNOSIS — E7849 Other hyperlipidemia: Secondary | ICD-10-CM

## 2021-07-18 DIAGNOSIS — I130 Hypertensive heart and chronic kidney disease with heart failure and stage 1 through stage 4 chronic kidney disease, or unspecified chronic kidney disease: Secondary | ICD-10-CM | POA: Diagnosis not present

## 2021-07-18 DIAGNOSIS — N179 Acute kidney failure, unspecified: Secondary | ICD-10-CM | POA: Diagnosis not present

## 2021-07-18 DIAGNOSIS — I350 Nonrheumatic aortic (valve) stenosis: Secondary | ICD-10-CM | POA: Diagnosis not present

## 2021-07-18 DIAGNOSIS — Z720 Tobacco use: Secondary | ICD-10-CM | POA: Diagnosis not present

## 2021-07-18 DIAGNOSIS — N183 Chronic kidney disease, stage 3 unspecified: Secondary | ICD-10-CM | POA: Diagnosis not present

## 2021-07-18 DIAGNOSIS — I5023 Acute on chronic systolic (congestive) heart failure: Secondary | ICD-10-CM | POA: Diagnosis not present

## 2021-07-18 DIAGNOSIS — E875 Hyperkalemia: Secondary | ICD-10-CM | POA: Diagnosis not present

## 2021-07-18 DIAGNOSIS — M545 Low back pain, unspecified: Secondary | ICD-10-CM | POA: Diagnosis not present

## 2021-07-18 NOTE — Patient Instructions (Signed)
Medication Instructions:  Your physician has recommended you make the following change in your medication:   Take Lasix (Furosemide) as needed for weight gain >2lbs overnight or >5lbs in a week.  *If you need a refill on your cardiac medications before your next appointment, please call your pharmacy*   Lab Work: TODAY: BMET, BNP (STAT) BMET next week by Alvis Lemmings RN at home  If you have labs (blood work) drawn today and your tests are completely normal, you will receive your results only by: [place] (if you have MyChart) OR A paper copy in the mail If you have any lab test that is abnormal or we need to change your treatment, we will call you to review the results.   Follow-Up: At [facility], you and your health needs are our priority.  As part of our continuing mission to provide you with exceptional heart care, we have created designated Provider Care Teams.  These Care Teams include your primary Cardiologist (physician) and Advanced Practice Providers (APPs -  Physician Assistants and Nurse Practitioners) who all work together to provide you with the care you need, when you need it.  We recommend signing up for the patient portal called "MyChart".  Sign up information is provided on this After Visit Summary.  MyChart is used to connect with patients for Virtual Visits (Telemedicine).  Patients are able to view lab/test results, encounter notes, upcoming appointments, etc.  Non-urgent messages can be sent to your provider as well.   To learn more about what you can do with MyChart, go to [email or web address].    Your next appointment:   2 week(s)  The format for your next appointment:   In Person  Provider:   Jenne Campus, MD{   Other Instructions Two Gram Sodium Diet 2000 mg  What is Sodium? Sodium is a mineral found naturally in many foods. The most significant source of sodium in the diet is table salt, which is about 40% sodium.  Processed, convenience, and  preserved foods also contain a large amount of sodium.  The body needs only 500 mg of sodium daily to function,  A normal diet provides more than enough sodium even if you do not use salt.  Why Limit Sodium? A build up of sodium in the body can cause thirst, increased blood pressure, shortness of breath, and water retention.  Decreasing sodium in the diet can reduce edema and risk of heart attack or stroke associated with high blood pressure.  Keep in mind that there are many other factors involved in these health problems.  Heredity, obesity, lack of exercise, cigarette smoking, stress and what you eat all play a role.  General Guidelines: Do not add salt at the table or in cooking.  One teaspoon of salt contains over 2 grams of sodium. Read food labels Avoid processed and convenience foods Ask your dietitian before eating any foods not dicussed in the menu planning guidelines Consult your physician if you wish to use a salt substitute or a sodium containing medication such as antacids.  Limit milk and milk products to 16 oz (2 cups) per day.  Shopping Hints: READ LABELS!! "Dietetic" does not necessarily mean low sodium. Salt and other sodium ingredients are often added to foods during processing.    Menu Planning Guidelines Food Group Choose More Often Avoid  Beverages (see also the milk group All fruit juices, low-sodium, salt-free vegetables juices, low-sodium carbonated beverages Regular vegetable or tomato juices, commercially softened water used for drinking or  cooking  Breads and Cereals Enriched white, wheat, rye and pumpernickel bread, hard rolls and dinner rolls; muffins, cornbread and waffles; most dry cereals, cooked cereal without added salt; unsalted crackers and breadsticks; low sodium or homemade bread crumbs Bread, rolls and crackers with salted tops; quick breads; instant hot cereals; pancakes; commercial bread stuffing; self-rising flower and biscuit mixes; regular bread  crumbs or cracker crumbs  Desserts and Sweets Desserts and sweets mad with mild should be within allowance Instant pudding mixes and cake mixes  Fats Butter or margarine; vegetable oils; unsalted salad dressings, regular salad dressings limited to 1 Tbs; light, sour and heavy cream Regular salad dressings containing bacon fat, bacon bits, and salt pork; snack dips made with instant soup mixes or processed cheese; salted nuts  Fruits Most fresh, frozen and canned fruits Fruits processed with salt or sodium-containing ingredient (some dried fruits are processed with sodium sulfites        Vegetables Fresh, frozen vegetables and low- sodium canned vegetables Regular canned vegetables, sauerkraut, pickled vegetables, and others prepared in brine; frozen vegetables in sauces; vegetables seasoned with ham, bacon or salt pork  Condiments, Sauces, Miscellaneous  Salt substitute with physician's approval; pepper, herbs, spices; vinegar, lemon or lime juice; hot pepper sauce; garlic powder, onion powder, low sodium soy sauce (1 Tbs.); low sodium condiments (ketchup, chili sauce, mustard) in limited amounts (1 tsp.) fresh ground horseradish; unsalted tortilla chips, pretzels, potato chips, popcorn, salsa (1/4 cup) Any seasoning made with salt including garlic salt, celery salt, onion salt, and seasoned salt; sea salt, rock salt, kosher salt; meat tenderizers; monosodium glutamate; mustard, regular soy sauce, barbecue, sauce, chili sauce, teriyaki sauce, steak sauce, Worcestershire sauce, and most flavored vinegars; canned gravy and mixes; regular condiments; salted snack foods, olives, picles, relish, horseradish sauce, catsup   Food preparation: Try these seasonings Meats:    Pork Sage, onion Serve with applesauce  Chicken Poultry seasoning, thyme, parsley Serve with cranberry sauce  Lamb Curry powder, rosemary, garlic, thyme Serve with mint sauce or jelly  Veal Marjoram, basil Serve with current jelly,  cranberry sauce  Beef Pepper, bay leaf Serve with dry mustard, unsalted chive butter  Fish [place] leaf, dill Serve with unsalted lemon butter, unsalted parsley butter  Vegetables:    Asparagus Lemon juice   Broccoli Lemon juice   Carrots Mustard dressing parsley, mint, nutmeg, glazed with unsalted butter and sugar   Green beans Marjoram, lemon juice, nutmeg,dill seed   Tomatoes Basil, marjoram, onion   Spice /blend for [employer]" 4 tsp ground thyme 1 tsp ground sage 3 tsp ground rosemary 4 tsp ground marjoram   Test your knowledge A product that says "Salt Free" may still contain sodium. True or False Garlic Powder and Hot Pepper Sauce an be used as alternative seasonings.True or False Processed foods have more sodium than fresh foods.  True or False Canned Vegetables have less sodium than froze True or False   WAYS TO DECREASE YOUR SODIUM INTAKE Avoid the use of added salt in cooking and at the table.  Table salt (and other prepared seasonings which contain salt) is probably one of the greatest sources of sodium in the diet.  Unsalted foods can gain flavor from the sweet, sour, and butter taste sensations of herbs and spices.  Instead of using salt for seasoning, try the following seasonings with the foods listed.  Remember: how you use them to enhance natural food flavors is limited only by your creativity... Allspice-Meat, fish, eggs, fruit, peas,  red and yellow vegetables Almond Extract-Fruit baked goods Anise Seed-Sweet breads, fruit, carrots, beets, cottage cheese, cookies (tastes like licorice) Basil-Meat, fish, eggs, vegetables, rice, vegetables salads, soups, sauces Bay Leaf-Meat, fish, stews, poultry Burnet-Salad, vegetables (cucumber-like flavor) Caraway Seed-Bread, cookies, cottage cheese, meat, vegetables, cheese, rice Cardamon-Baked goods, fruit, soups Celery Powder or seed-Salads, salad dressings, sauces, meatloaf, soup, bread.Do not use  celery salt Chervil-Meats,  salads, fish, eggs, vegetables, cottage cheese (parsley-like flavor) Chili Power-Meatloaf, chicken cheese, corn, eggplant, egg dishes Chives-Salads cottage cheese, egg dishes, soups, vegetables, sauces Cilantro-Salsa, casseroles Cinnamon-Baked goods, fruit, pork, lamb, chicken, carrots Cloves-Fruit, baked goods, fish, pot roast, green beans, beets, carrots Coriander-Pastry, cookies, meat, salads, cheese (lemon-orange flavor) Cumin-Meatloaf, fish,cheese, eggs, cabbage,fruit pie (caraway flavor) [employer], fruit, eggs, fish, poultry, cottage cheese, vegetables Dill Seed-Meat, cottage cheese, poultry, vegetables, fish, salads, bread Fennel Seed-Bread, cookies, apples, pork, eggs, fish, beets, cabbage, cheese, Licorice-like flavor Garlic-(buds or powder) Salads, meat, poultry, fish, bread, butter, vegetables, potatoes.Do not  use garlic salt Ginger-Fruit, vegetables, baked goods, meat, fish, poultry Horseradish Root-Meet, vegetables, butter Lemon Juice or Extract-Vegetables, fruit, tea, baked goods, fish salads Mace-Baked goods fruit, vegetables, fish, poultry (taste like nutmeg) Maple Extract-Syrups Marjoram-Meat, chicken, fish, vegetables, breads, green salads (taste like Sage) Mint-Tea, lamb, sherbet, vegetables, desserts, carrots, cabbage Mustard, Dry or Seed-Cheese, eggs, meats, vegetables, poultry Nutmeg-Baked goods, fruit, chicken, eggs, vegetables, desserts Onion Powder-Meat, fish, poultry, vegetables, cheese, eggs, bread, rice salads (Do not use   Onion salt) Orange Extract-Desserts, baked goods Oregano-Pasta, eggs, cheese, onions, pork, lamb, fish, chicken, vegetables, green salads Paprika-Meat, fish, poultry, eggs, cheese, vegetables Parsley Flakes-Butter, vegetables, meat fish, poultry, eggs, bread, salads (certain forms may   Contain sodium Pepper-Meat fish, poultry, vegetables, eggs Peppermint Extract-Desserts, baked goods Poppy Seed-Eggs, bread, cheese, fruit  dressings, baked goods, noodles, vegetables, cottage  [employer], poultry, meat, fish, cauliflower, turnips,eggs bread Saffron-Rice, bread, veal, chicken, fish, eggs Sage-Meat, fish, poultry, onions, eggplant, tomateos, pork, stews Savory-Eggs, salads, poultry, meat, rice, vegetables, soups, pork Tarragon-Meat, poultry, fish, eggs, butter, vegetables (licorice-like flavor)  Thyme-Meat, poultry, fish, eggs, vegetables, (clover-like flavor), sauces, soups Tumeric-Salads, butter, eggs, fish, rice, vegetables (saffron-like flavor) Vanilla Extract-Baked goods, candy Vinegar-Salads, vegetables, meat marinades Walnut Extract-baked goods, candy   2. Choose your Foods Wisely   The following is a list of foods to avoid which are high in sodium:  Meats-Avoid all smoked, canned, salt cured, dried and kosher meat and fish as well as Anchovies   [employer] meats:Bologna, Liverwurst, Pastrami Canned meat or fish  Marinated herring Caviar    Pepperoni Corned Beef   Pizza Dried chipped beef  Salami Frozen breaded fish or meat Salt pork Frankfurters or hot dogs  Sardines Gefilte fish   Sausage Ham (boiled ham, Proscuitto Smoked butt    spiced ham)   Spam      TV Dinners Vegetables Canned vegetables (Regular) Relish Canned mushrooms  Sauerkraut Olives    Tomato juice Pickles  Bakery and Dessert Products Canned puddings  Cream pies Cheesecake   Decorated cakes Cookies  Beverages/Juices Tomato juice, regular  Gatorade   V-8 vegetable juice, regular  Breads and Cereals Biscuit mixes   Salted potato chips, corn chips, pretzels Bread stuffing mixes  Salted crackers and rolls Pancake and waffle mixes Self-rising flour  Seasonings Accent    Meat sauces Barbecue sauce  Meat tenderizer Catsup    Monosodium glutamate (MSG) Celery salt   Onion salt Chili sauce   Prepared mustard Garlic salt  Salt, seasoned salt, sea salt Gravy  mixes   Soy sauce Horseradish   Steak sauce Ketchup   Tartar sauce Lite salt    Teriyaki sauce Marinade mixes   Worcestershire sauce  Others Baking powder   Cocoa and cocoa mixes Baking soda   Commercial casserole mixes Candy-caramels, chocolate  Dehydrated soups    Bars, fudge,nougats  Instant rice and pasta mixes Canned broth or soup  Maraschino cherries Cheese, aged and processed cheese and cheese spreads  Learning Assessment Quiz  Indicated T (for True) or F (for False) for each of the following statements:  _____ Fresh fruits and vegetables and unprocessed grains are generally low in sodium _____ Water may contain a considerable amount of sodium, depending on the source _____ You can always tell if a food is high in sodium by tasting it _____ Certain laxatives my be high in sodium and should be avoided unless prescribed   by a physician or pharmacist _____ Salt substitutes may be used freely by anyone on a sodium restricted diet _____ Sodium is present in table salt, food additives and as a natural component of   most foods _____ Table salt is approximately 90% sodium _____ Limiting sodium intake may help prevent excess fluid accumulation in the body _____ On a sodium-restricted diet, seasonings such as bouillon soy sauce, and    cooking wine should be used in place of table salt _____ On an ingredient list, a product which lists monosodium glutamate as the first   ingredient is an appropriate food to include on a low sodium diet  Circle the best answer(s) to the following statements (Hint: there may be more than one correct answer)  11. On a low-sodium diet, some acceptable snack items are:    A. Olives  F. Bean dip   K. Grapefruit juice    B. Salted Pretzels G. Commercial Popcorn   L. Canned peaches    C. Carrot Sticks  H. Bouillon   M. Unsalted nuts   D. [place] fries  I. Peanut butter crackers N. Salami   E. Sweet pickles J. Tomato Juice   O. Pizza  12.  Seasonings  that may be used freely on a reduced - sodium diet include   A. Lemon wedges F.Monosodium glutamate K. Celery seed    B.Soysauce   G. Pepper   L. Mustard powder   C. Sea salt  H. Cooking wine  M. Onion flakes   D. Vinegar  E. Prepared horseradish N. Salsa   E. Sage   J. Worcestershire sauce  O. [place]

## 2021-07-19 ENCOUNTER — Telehealth: Payer: Self-pay

## 2021-07-19 DIAGNOSIS — N390 Urinary tract infection, site not specified: Secondary | ICD-10-CM | POA: Diagnosis not present

## 2021-07-19 LAB — BASIC METABOLIC PANEL
BUN/Creatinine Ratio: 16 (ref 12–28)
BUN: 36 mg/dL (ref 10–36)
CO2: 28 mmol/L (ref 20–29)
Calcium: 9.4 mg/dL (ref 8.7–10.3)
Chloride: 104 mmol/L (ref 96–106)
Creatinine, Ser: 2.22 mg/dL — ABNORMAL HIGH (ref 0.57–1.00)
Glucose: 109 mg/dL — ABNORMAL HIGH (ref 70–99)
Potassium: 4.5 mmol/L (ref 3.5–5.2)
Sodium: 144 mmol/L (ref 134–144)
eGFR: 20 mL/min/{1.73_m2} — ABNORMAL LOW (ref >59)

## 2021-07-19 LAB — PRO B NATRIURETIC PEPTIDE: NT-Pro BNP: 865 pg/mL — ABNORMAL HIGH (ref 0–738)

## 2021-07-19 NOTE — Telephone Encounter (Signed)
Called and spoke with pt and daughter Baker Janus.  Informed of results per provider.   Imogene Burn, PA-C  07/19/2021  7:59 AM EDT     Potassium normal after kayexalate and Crt has come down to 2.22 with holding lasix. Can take lasix 40 mg as needed for weight gain 2-3 lbs overnight or 5 lbs in a week. BNP not back yet.  Daughter expresses understanding. Daughter reports pt will be seen by urology today at 3 PM.  Starting this morning pt has the urge to urinate but nothing comes out.  Asked daughter to have Urologist fax over office note to our office.

## 2021-07-19 NOTE — Progress Notes (Unsigned)
Cardiology Office Note    Date:  07/19/2021   ID:  Annette Huang, Annette Huang 15-Jun-1927, MRN [id number]   PCP:  Angelina Sheriff, MD   [place]  Cardiologist:  Jenne Campus, MD *** Advanced Practice Provider:  No care team member to display Electrophysiologist:  None   [phone]   No chief complaint on file.   History of Present Illness:  Annette Huang is a 88 y.o. female  with past medical history significant for aortic stenosis which is moderate, advanced COPD, she is a chronic smoker and still continues to smoke, carotid arterial disease, essential hypertension, dyslipidemia.    Patient last saw Dr. Agustin Cree 04/2021 and aortic stenosis was unchanged. LVEF 60-65% grade 2 DD, moderate AS. She has chronic shortness of breath felt related to her COPD and continues to smoke.   Is on O2 since admission at [facility] for COPD exacerbation-was put on prednisone-sent home with 24 tablets. Since then she has developed fluid overload.   She went to the ED at [facility] 07/08/2021 for shortness of breath and chest tightness.  She took 1 extra Lasix prior to going to the ED.  Troponins were normal. BNP 133, Crt  1.77 She diuresed with IV Lasix once.  Admission weight 150 pounds when she left the hospital 142 pounds Lasix was increased to 60 mg daily for 1 week then 40 mg daily. Labs 07/17/21 K 5.7, Crt 2.74. had [facility] and lasix held b/c of AKI and trimethoprim decreased 50 mg daily. Kayexalate given.  Weights at home past 3 days 144-145.weight here 147.  Has held lasix 2-3 days.  I saw her 07/18/21 and still some short of breath and lung crackles but edema down. Crt came down 2.22, BNP-, K normal after Kayexalate. I told her to take lasix prn.    Past Medical History:  Diagnosis Date   Aortic stenosis 11/09/2014   Overview:  Mild in 2015 Moderate to severe in the October 2018   Chronic respiratory failure with hypoxia (HCC) 08/06/2018   02 hs  x 2lpm as of 08/05/2018 and prn daytime to keep sats > 90%  - 09/01/2018 no desats walking / limited by back    COPD (chronic obstructive pulmonary disease) (HCC)    Coronary artery disease    CVA (cerebral vascular accident) ([place])    Diastolic congestive heart failure ([place]) 07/31/2018   DOE (dyspnea on exertion) 12/11/2016   Quit smoking 07/22/18 with emphysema on ct from 06/12/2018 - PFT 02/17/17    FEV1 1.35 [118%], ratio 0.82 with 14% resp to saba and TLC 93%, DLCO 73% - Echo 01/20/2018 c/w mod AS s PH - pt stopped vasotec and anoro on her own prior to pulmonary ov 08/05/2018 > rec leave off and rx for gerd x 4 weeks > improved as of 09/01/2018  - 09/01/2018   Walked RA x one lap =  approx 250 ft avg pace >> stopped due to   Essential hypertension 08/18/2013   rec Off acei permanently as of 08/05/2018 due to pseudowheeze    Greater trochanteric bursitis of right hip 11/13/2017   Hyperlipemia 08/18/2013   Hyperlipidemia    Hypertension    Late effects of CVA (cerebrovascular accident) 11/09/2014   Lumbar degenerative disc disease 06/18/2013   Lumbar spondylosis 06/18/2013   Peripheral vascular disease, unspecified ([place]) status post right carotic endarterectomy 08/28/2018   Sacroiliac inflammation ([place]) 11/13/2017   Sacroiliitis ([place]) 11/13/2017  Smoking 05/23/2015   Spinal stenosis of lumbar region with neurogenic claudication 08/18/2013   Stroke ([place]) 09/08/2013   Thoracic aortic atherosclerosis [facility])     Past Surgical History:  Procedure Laterality Date   CAROTID ENDARTERECTOMY Bilateral    NECK SURGERY      Current Medications: No outpatient medications have been marked as taking for the 07/31/21 encounter (Appointment) with Imogene Burn, PA-C.     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  Tobacco Use   Smoking status: Former    Packs/day: 1.00    Years: 35.00    Total pack years: 35.00    Types: Cigarettes    Quit date: 2023    Years since quitting: 0.4   Smokeless tobacco: Never  Vaping Use   Vaping Use: Former  Substance and Sexual Activity   Alcohol use: No   Drug use: No   Sexual activity: Not on file  Other Topics Concern   Not on file  Social History Narrative   Not on file   Social Determinants of Health   Financial Resource Strain: Not on file  Food Insecurity: Not on file  Transportation Needs: Not on file  Physical Activity: Not on file  Stress: Not on file  Social Connections: Not on file     Family History:  The patient's ***family history includes Heart disease in her father and mother; Hypertension in her mother.   ROS:   Please see the history of present illness.    ROS All other systems reviewed and are negative.   PHYSICAL EXAM:   VS:  There were no vitals taken for this visit.  Physical Exam  GEN: Well nourished, well developed, in no acute distress  HEENT: normal  Neck: no JVD, carotid bruits, or masses Cardiac:RRR; no murmurs, rubs, or gallops  Respiratory:  clear to auscultation bilaterally, normal work of breathing GI: soft, nontender, nondistended, + BS Ext: without cyanosis, clubbing, or edema, Good distal pulses bilaterally MS: no deformity or atrophy  Skin: warm and dry, no rash Neuro:  Alert and Oriented x 3, Strength and sensation are intact Psych: euthymic mood, full affect  Wt Readings from Last 3 Encounters:  07/18/21 147 lb (66.7 kg)  07/13/21 145 lb 3.2 oz (65.9 kg)  06/25/21 144 lb 9.6 oz (65.6 kg)      Studies/Labs Reviewed:   EKG:  EKG is*** ordered today.  The ekg ordered today demonstrates ***  Recent Labs: 07/18/2021: BUN 36; Creatinine, Ser 2.22; NT-Pro BNP WILL FOLLOW; Potassium 4.5; Sodium 144   Lipid Panel    Component Value Date/Time    CHOL 132 09/14/2019 1006   TRIG 59 09/14/2019 1006   HDL 54 09/14/2019 1006   CHOLHDL 2.4 09/14/2019 1006   LDLCALC 65 09/14/2019 1006    Additional studies/ records that were reviewed today include:     Echo 04/2021    IMPRESSIONS     1. Left ventricular ejection fraction, by estimation, is 60 to 65%. The  left ventricle has normal function. The left ventricle has no regional  wall motion abnormalities. Left ventricular diastolic parameters are  consistent with Grade II diastolic  dysfunction (pseudonormalization).  2. Right ventricular systolic function is normal. The right ventricular  size is normal. There is mildly elevated pulmonary artery systolic  pressure.   3. Left atrial size was mildly dilated.   4. The mitral valve is normal in structure. No evidence of mitral valve  regurgitation. No evidence of mitral stenosis.   5. The aortic valve is normal in structure. Aortic valve regurgitation is  not visualized. Moderate aortic valve stenosis.   6. The inferior vena cava is normal in size with greater than 50%  respiratory variability, suggesting right atrial pressure of 3 mmHg.   FINDINGS   Left Ventricle: Left ventricular ejection fraction, by estimation, is 60  to 65%. The left ventricle has normal function. The left ventricle has no  regional wall motion abnormalities. The left ventricular internal cavity  size was normal in size. There is   borderline left ventricular hypertrophy. Left ventricular diastolic  parameters are consistent with Grade II diastolic dysfunction  (pseudonormalization).   Right Ventricle: The right ventricular size is normal. No increase in  right ventricular wall thickness. Right ventricular systolic function is  normal. There is mildly elevated pulmonary artery systolic pressure. The  tricuspid regurgitant velocity is 2.89   m/s, and with an assumed right atrial pressure of 3 mmHg, the estimated  right ventricular systolic pressure is  09.6 mmHg.   Left Atrium: Left atrial size was mildly dilated.   Right Atrium: Right atrial size was normal in size.   Pericardium: There is no evidence of pericardial effusion.   Mitral Valve: The mitral valve is normal in structure. Mild mitral annular  calcification. No evidence of mitral valve regurgitation. No evidence of  mitral valve stenosis. MV peak gradient, 10.0 mmHg. The mean mitral valve  gradient is 4.5 mmHg.   Tricuspid Valve: The tricuspid valve is normal in structure. Tricuspid  valve regurgitation is mild . No evidence of tricuspid stenosis.   Aortic Valve: The aortic valve is normal in structure. Aortic valve  regurgitation is not visualized. Moderate aortic stenosis is present.  Aortic valve mean gradient measures 21.0 mmHg. Aortic valve peak gradient  measures 36.5 mmHg. Aortic valve area, by  VTI measures 1.00 cm.   Pulmonic Valve: The pulmonic valve was normal in structure. Pulmonic valve  regurgitation is not visualized. No evidence of pulmonic stenosis.   Aorta: The aortic root is normal in size and structure.   Venous: The inferior vena cava is normal in size with greater than 50%  respiratory variability, suggesting right atrial pressure of 3 mmHg.   IAS/Shunts: No atrial level shunt detected by color flow Doppler.      Risk Assessment/Calculations:   {Does this patient have ATRIAL FIBRILLATION?:[phone]}     ASSESSMENT:    No diagnosis found.   PLAN:  In order of problems listed above:    Acute on Chronic diastolic CHF with recent emergency room visit 07/08/2021 requiring IV diuresis.  Discharge weight 142. She was put on steroids in May during hospitalization for COPD exacerbation. Since then she's had increase edema but developed AKI/hyperkalemia. Was seen by [facility] yest and given kayexalate. Will repeat labs today. Some rales on exam but neck veins flat and no edema. Weight up some from hospital. Getting extra salt. Hasn't had  lasix in 2 days.  on lasix prn weight gain 2-3 lbs overnight or 5 lbs in a week.    AKI with recent COPD exacerbation treated with steroids causing CHF and hospitalize requiring IV diuresis. Receive kayexalate  last night and this am. repeat labs Crt 2.22  and next week with [facility]. Refer to renal.  Moderate aortic stenosis on echo 04/2021 normal LVEF and G2DD.      Hypertension BP stable   HLD on lipitor   COPD now on O2 since COPD exacerbation last month.   Shared Decision Making/Informed Consent   {Are you ordering a CV Procedure (e.g. stress test, cath, DCCV, TEE, etc)?   Press F2        :[id number]}    Medication Adjustments/Labs and Tests Ordered: Current medicines are reviewed at length with the patient today.  Concerns regarding medicines are outlined above.  Medication changes, Labs and Tests ordered today are listed in the Patient Instructions below. There are no Patient Instructions on file for this visit.   Sumner Boast, PA-C  07/19/2021 8:13 AM    [facility] [place], [place],   [address] Phone: [phone]; Fax: [phone]

## 2021-07-20 ENCOUNTER — Telehealth: Payer: Self-pay | Admitting: Physician Assistant

## 2021-07-20 ENCOUNTER — Encounter: Payer: Self-pay | Admitting: Physician Assistant

## 2021-07-20 DIAGNOSIS — I10 Essential (primary) hypertension: Secondary | ICD-10-CM | POA: Diagnosis not present

## 2021-07-20 DIAGNOSIS — I130 Hypertensive heart and chronic kidney disease with heart failure and stage 1 through stage 4 chronic kidney disease, or unspecified chronic kidney disease: Secondary | ICD-10-CM | POA: Diagnosis not present

## 2021-07-20 DIAGNOSIS — Z6830 Body mass index (BMI) 30.0-30.9, adult: Secondary | ICD-10-CM | POA: Diagnosis not present

## 2021-07-20 DIAGNOSIS — N1832 Chronic kidney disease, stage 3b: Secondary | ICD-10-CM | POA: Diagnosis not present

## 2021-07-20 DIAGNOSIS — N289 Disorder of kidney and ureter, unspecified: Secondary | ICD-10-CM | POA: Diagnosis not present

## 2021-07-20 DIAGNOSIS — I5032 Chronic diastolic (congestive) heart failure: Secondary | ICD-10-CM | POA: Diagnosis not present

## 2021-07-20 DIAGNOSIS — E559 Vitamin D deficiency, unspecified: Secondary | ICD-10-CM | POA: Diagnosis not present

## 2021-07-20 DIAGNOSIS — H9193 Unspecified hearing loss, bilateral: Secondary | ICD-10-CM | POA: Diagnosis not present

## 2021-07-20 DIAGNOSIS — J449 Chronic obstructive pulmonary disease, unspecified: Secondary | ICD-10-CM | POA: Diagnosis not present

## 2021-07-20 DIAGNOSIS — F17200 Nicotine dependence, unspecified, uncomplicated: Secondary | ICD-10-CM | POA: Diagnosis not present

## 2021-07-20 DIAGNOSIS — I5033 Acute on chronic diastolic (congestive) heart failure: Secondary | ICD-10-CM | POA: Diagnosis not present

## 2021-07-20 NOTE — Telephone Encounter (Signed)
Patient's daughter is returning call to discuss lab results. 

## 2021-07-20 NOTE — Telephone Encounter (Signed)
Reviewed results with patient's daughter.  Will give dose of lasix today per result recommendations.

## 2021-07-23 DIAGNOSIS — N183 Chronic kidney disease, stage 3 unspecified: Secondary | ICD-10-CM | POA: Diagnosis not present

## 2021-07-23 DIAGNOSIS — E875 Hyperkalemia: Secondary | ICD-10-CM | POA: Diagnosis not present

## 2021-07-23 DIAGNOSIS — I5032 Chronic diastolic (congestive) heart failure: Secondary | ICD-10-CM | POA: Diagnosis not present

## 2021-07-23 DIAGNOSIS — N179 Acute kidney failure, unspecified: Secondary | ICD-10-CM | POA: Diagnosis not present

## 2021-07-23 DIAGNOSIS — I130 Hypertensive heart and chronic kidney disease with heart failure and stage 1 through stage 4 chronic kidney disease, or unspecified chronic kidney disease: Secondary | ICD-10-CM | POA: Diagnosis not present

## 2021-07-26 NOTE — Telephone Encounter (Signed)
error 

## 2021-07-27 DIAGNOSIS — I5033 Acute on chronic diastolic (congestive) heart failure: Secondary | ICD-10-CM | POA: Diagnosis not present

## 2021-07-27 DIAGNOSIS — I110 Hypertensive heart disease with heart failure: Secondary | ICD-10-CM | POA: Diagnosis not present

## 2021-07-30 ENCOUNTER — Ambulatory Visit (INDEPENDENT_AMBULATORY_CARE_PROVIDER_SITE_OTHER): Payer: Medicare HMO | Admitting: Internal Medicine

## 2021-07-30 ENCOUNTER — Ambulatory Visit: Payer: Medicare HMO | Admitting: Internal Medicine

## 2021-07-30 ENCOUNTER — Other Ambulatory Visit: Payer: Self-pay | Admitting: Cardiology

## 2021-07-30 ENCOUNTER — Ambulatory Visit (INDEPENDENT_AMBULATORY_CARE_PROVIDER_SITE_OTHER): Payer: Medicare HMO

## 2021-07-30 ENCOUNTER — Telehealth: Payer: Self-pay | Admitting: Cardiology

## 2021-07-30 ENCOUNTER — Encounter: Payer: Self-pay | Admitting: Internal Medicine

## 2021-07-30 DIAGNOSIS — I5033 Acute on chronic diastolic (congestive) heart failure: Secondary | ICD-10-CM | POA: Diagnosis not present

## 2021-07-30 DIAGNOSIS — R0602 Shortness of breath: Secondary | ICD-10-CM | POA: Diagnosis not present

## 2021-07-30 DIAGNOSIS — J9611 Chronic respiratory failure with hypoxia: Secondary | ICD-10-CM

## 2021-07-30 DIAGNOSIS — E785 Hyperlipidemia, unspecified: Secondary | ICD-10-CM | POA: Diagnosis not present

## 2021-07-30 DIAGNOSIS — J449 Chronic obstructive pulmonary disease, unspecified: Secondary | ICD-10-CM | POA: Diagnosis not present

## 2021-07-30 DIAGNOSIS — J9612 Chronic respiratory failure with hypercapnia: Secondary | ICD-10-CM | POA: Diagnosis not present

## 2021-07-30 DIAGNOSIS — R0609 Other forms of dyspnea: Secondary | ICD-10-CM

## 2021-07-30 DIAGNOSIS — I110 Hypertensive heart disease with heart failure: Secondary | ICD-10-CM | POA: Diagnosis not present

## 2021-07-30 DIAGNOSIS — J90 Pleural effusion, not elsewhere classified: Secondary | ICD-10-CM | POA: Diagnosis not present

## 2021-07-30 NOTE — Progress Notes (Signed)
Annette Huang, female    DOB: 11/05/1927,   MRN: [id number]   Brief patient profile:  58 yowf  Quit smoking  06/2021  referred to pulmonary clinic 08/05/2018 by Dr  Jeanie Sewer for eval of cough/ sob ? All copd related though note last pfts on record >>>02/17/17    FEV1 1.35 [118%], ratio 0.82 with 14% resp to saba and TLC 93%, DLCO 73%     History of Present Illness  08/05/2018  Pulmonary/ 1st office eval/Cordaro Mukai  - stopped vasotec/anoro prior to ov  Chief Complaint  Patient presents with   Pulmonary Consult    Referred by Dr Gwendlyn Deutscher for eval of COPD. Pt c/o increased cough and SOB for the past few wks.   Dyspnea:  Can lean on buggy food lion = MMRC2 = can't walk a nl pace on a flat grade s sob but does fine slow and flat  Cough: some throat congestion  Sleep: wedge x 30 degrees  And 2lpm at hs  SABA use: duoneb 2-3 per day / was only taking prn anoro prior to stopping it completely Cough some better with prednisone but never compltely free of sense of throat congestion Has 02 not using x at hs 2lpm rec Continue off anoro and vasotec as you are  Pantoprazole (protonix) 40 mg   Take  30-60 min before first meal of the day and Pepcid (famotidine)  20 mg one  After supper  until return to office - this is the best way to tell whether stomach acid is contributing to your problem.  GERD diet  Only use your albuterol-ipatropium  as a rescue medication   Wear 2lpm at bedtime and during the day as needed to keep your saturation above 90% and adjust the flow to keep it over 90% at all times to help your heart deliver 0xygen without having to strain.    09/01/2018  f/u ov/Maika Mcelveen re: doe/ min copd clinically  Chief Complaint  Patient presents with   Follow-up    Breathing is overall doing well and she has not used her neb or o2 recently.   Dyspnea:  Better but not very active Cough: none / throat congetstion is better  Sleeping: able to lie flat one pillow SABA use: none  02: just sometimes  at hs / not checking sats with activity  rec Wear 02 at bedtime  X 2lpm and as needed during the day for short of breath or 02 level less than 90% Nebulizer is only as needed    06/25/2021  f/u ov/Aprel Egelhoff re: doe / min copd   maint on nothing has neb prn and started April 20th  p ? URI > changed neb to qid  - now on  fosamax with prominent pseudowheeze  Chief Complaint  Patient presents with   Follow-up    DOE  Dyspnea:  not back to baseline  Cough: not much  Sleeping: wedges doing fine  SABA use:qid neb 02: 2lpm bedtime and 2lpm prn daytime Covid status:   JJ Rec Stop fosamax  the alternatives are Prolia injection (shots twice a year or relclast infusion (once a year)  Pantoprazole (protonix) 40 mg   Take  30-60 min before first meal of the day and Pepcid (famotidine)  20 mg after supper until return to office - this is the best way to tell whether stomach acid is contributing to your problem.   GERD rx Make sure you check your oxygen saturation  AT  your highest level of activity (not after you stop)     07/08/21  ER eval for SOB/ leg swelling     07/13/2021  f/u ov/Kaylon Hitz/ [facility] re: AS/copd/ hypoxemic /hypercarbic resp failure maint on qid duoneb/ budesonide bid   Chief Complaint  Patient presents with   Follow-up    SOB and small amount of cough   Dyspnea:  100 ft / hc parking  Cough: none  Sleeping: able to lie down flatter  SABA use: qid duoneb plus bid pulmocort 02: 2 lpm and prn daytime  Swallowing ok/ cc "indigestion" chronically  Has AS but refused to consider TAVR per daughter Rec Stay on the same nebulizer for  now  Make sure you check your oxygen saturation  AT  your highest level of activity (not after you stop)   to be sure it stays over 90%   I will be referring to ENT in [facility] for the throat wheezing you are experiencing    07/30/2021  f/u ov/Eldoris Beiser re: AS/COPD/hypoxemic/hypercarbic ? VCD    maint on 02   Chief Complaint  Patient presents with    Follow-up  Dyspnea:  sitting at rest sometimes sob now Cough: minimal  Sleeping: wedges under mattress gets about 45% but wakes up sob at nl hour, no pnd SABA use: no extra  02: 2lpm      No obvious day to day or daytime variability or assoc excess/ purulent sputum or mucus plugs or hemoptysis or cp or chest tightness, subjective wheeze or overt sinus or hb symptoms.   sleeping without nocturnal   exacerbation  of respiratory  c/o's or need for noct saba. Also denies any obvious fluctuation of symptoms with weather or environmental changes or other aggravating or alleviating factors except as outlined above   No unusual exposure hx or h/o childhood pna/ asthma or knowledge of premature birth.  Current Allergies, Complete Past Medical History, Past Surgical History, Family History, and Social History were reviewed in [employer] record.  ROS  The following are not active complaints unless bolded Hoarseness, sore throat, dysphagia, dental problems, itching, sneezing,  nasal congestion or discharge of excess mucus or purulent secretions, ear ache,   fever, chills, sweats, unintended wt loss or wt gain, classically pleuritic or exertional cp,  orthopnea pnd or arm/hand swelling  or leg swelling, presyncope, palpitations, abdominal pain, anorexia, nausea, vomiting, diarrhea  or change in bowel habits or change in bladder habits, change in stools or change in urine, dysuria, hematuria,  rash, arthralgias, visual complaints, headache, numbness, gen weakness or ataxia or problems with walking or coordination,  change in mood or  memory.        Current Meds  Medication Sig   albuterol (VENTOLIN HFA) 108 (90 Base) MCG/ACT inhaler Inhale 1 puff into the lungs as needed for wheezing or shortness of breath.   ALPRAZolam (XANAX) 0.25 MG tablet Take 0.25 mg by mouth daily as needed.   amLODipine (NORVASC) 5 MG tablet Take 5 mg by mouth daily.   atorvastatin (LIPITOR) 40 MG tablet  TAKE 1 TABLET BY MOUTH EVERY DAY   budesonide (PULMICORT) 0.25 MG/2ML nebulizer solution Take 2 mLs (0.25 mg total) by nebulization 2 (two) times daily.   clopidogrel (PLAVIX) 75 MG tablet Take 75 mg by mouth daily.    famotidine (PEPCID) 20 MG tablet One after supper   furosemide (LASIX) 40 MG tablet Take 40 mg daily by mouth.   ipratropium-albuterol (DUONEB) 0.5-2.5 (3) MG/3ML SOLN  Take 3 mLs by nebulization every 4 (four) hours as needed (Wheezing  and SOB).   Lactobacillus Rhamnosus, GG, (CULTURELLE) CAPS Take 1 capsule by mouth daily.   levothyroxine (SYNTHROID, LEVOTHROID) 50 MCG tablet Take 1 tablet daily by mouth.   lidocaine (LIDODERM) 5 % as needed for pain.   magnesium oxide (MAG-OX) 400 (240 Mg) MG tablet Take 1 tablet by mouth 2 (two) times daily.   metoprolol tartrate (LOPRESSOR) 25 MG tablet TAKE 1 TABLET BY MOUTH 2 TIMES DAILY.   OXYGEN Inhale 2 L into the lungs as needed (Mainly at bedtime). 2lpm as needed   pantoprazole (PROTONIX) 40 MG tablet Take 40 mg by mouth daily.   potassium chloride (MICRO-K) 10 MEQ CR capsule Take 1 capsule (10 mEq total) by mouth 2 (two) times daily. (Patient taking differently: Take 10 mEq by mouth daily.)   traZODone (DESYREL) 50 MG tablet Take 50 mg by mouth as needed for sleep.   trimethoprim (TRIMPEX) 100 MG tablet Take 50 mg by mouth daily.   Vitamin D, Ergocalciferol, (DRISDOL) 1.25 MG (50000 UT) CAPS capsule Take 50,000 Units by mouth 2 (two) times a week.                  Past Medical History:  Diagnosis Date   COPD (chronic obstructive pulmonary disease) (HCC)    Coronary artery disease    CVA (cerebral vascular accident) (HCC)    Hyperlipidemia    Hypertension    Thoracic aortic atherosclerosis (HCC)       Objective:    07/30/2021       151  07/13/2021         145    06/25/21 144 lb 9.6 oz (65.6 kg)  04/18/21 142 lb 6.4 oz (64.6 kg)  10/13/20 142 lb 9.6 oz (64.7 kg)    Vital signs reviewed  07/30/2021  - Note at rest 02  sats  95% on 2lpm    General appearance:    amb frail edlery wf nad at rest     HEENT : Oropharynx  clear  Nasal turbinates nl    NECK :  without  apparent JVD/ palpable Nodes/TM    LUNGS: no acc muscle use,  Min barrel/kyphotic  contour chest wall with bilateral  slightly decreased bs s audible wheeze and  without cough on insp or exp maneuvers and min  Hyperresonant  to  percussion bilaterally    CV:  RRR  no s3-   3/6 sem s  increase in P2, and 1+ Pitting slt both LE worse on L   ABD:  soft and nontender with pos end  insp Hoover's  in the supine position.  No bruits or organomegaly appreciated   MS:  Nl gait/ ext warm without deformities Or obvious joint restrictions  calf tenderness, cyanosis or clubbing     SKIN: warm and dry without lesions    NEURO:  alert, approp, nl sensorium with  no motor or cerebellar deficits apparent.         CXR PA and Lateral:   07/30/2021 :    I personally reviewed images and impression is as follows:     Worse L effusion is the only new finding         Assessment

## 2021-07-31 ENCOUNTER — Encounter: Payer: Self-pay | Admitting: Internal Medicine

## 2021-07-31 ENCOUNTER — Encounter: Payer: Self-pay | Admitting: Physician Assistant

## 2021-07-31 ENCOUNTER — Ambulatory Visit: Payer: Medicare HMO | Admitting: Physician Assistant

## 2021-07-31 ENCOUNTER — Other Ambulatory Visit: Payer: Self-pay

## 2021-07-31 VITALS — BP 140/68 | HR 82 | Ht 59.0 in | Wt 151.0 lb

## 2021-07-31 DIAGNOSIS — I10 Essential (primary) hypertension: Secondary | ICD-10-CM

## 2021-07-31 DIAGNOSIS — I5032 Chronic diastolic (congestive) heart failure: Secondary | ICD-10-CM

## 2021-07-31 DIAGNOSIS — J449 Chronic obstructive pulmonary disease, unspecified: Secondary | ICD-10-CM | POA: Diagnosis not present

## 2021-07-31 DIAGNOSIS — I350 Nonrheumatic aortic (valve) stenosis: Secondary | ICD-10-CM | POA: Diagnosis not present

## 2021-07-31 DIAGNOSIS — N179 Acute kidney failure, unspecified: Secondary | ICD-10-CM

## 2021-07-31 DIAGNOSIS — E785 Hyperlipidemia, unspecified: Secondary | ICD-10-CM

## 2021-07-31 LAB — BASIC METABOLIC PANEL
BUN/Creatinine Ratio: 13 (ref 12–28)
BUN: 21 mg/dL (ref 10–36)
CO2: 32 mmol/L — ABNORMAL HIGH (ref 20–29)
Calcium: 10 mg/dL (ref 8.7–10.3)
Chloride: 102 mmol/L (ref 96–106)
Creatinine, Ser: 1.61 mg/dL — ABNORMAL HIGH (ref 0.57–1.00)
Glucose: 106 mg/dL — ABNORMAL HIGH (ref 70–99)
Potassium: 4.6 mmol/L (ref 3.5–5.2)
Sodium: 141 mmol/L (ref 134–144)
eGFR: 30 mL/min/{1.73_m2} — ABNORMAL LOW (ref >59)

## 2021-07-31 MED ORDER — FUROSEMIDE 40 MG PO TABS: 40.0000 mg | ORAL_TABLET | Freq: Every day | ORAL | 3 refills | Status: DC

## 2021-07-31 NOTE — Telephone Encounter (Signed)
Daughter of the patient called. The patient's labs were done yesterday but can not be processed until there is an MD signature. The patient is on her way to the office to see Jacolyn Reedy now

## 2021-08-01 ENCOUNTER — Other Ambulatory Visit: Payer: Self-pay

## 2021-08-01 MED ORDER — ATORVASTATIN CALCIUM 40 MG PO TABS: 40.0000 mg | ORAL_TABLET | Freq: Every day | ORAL | 2 refills | Status: AC

## 2021-08-08 DIAGNOSIS — N179 Acute kidney failure, unspecified: Secondary | ICD-10-CM | POA: Diagnosis not present

## 2021-08-08 DIAGNOSIS — J449 Chronic obstructive pulmonary disease, unspecified: Secondary | ICD-10-CM | POA: Diagnosis not present

## 2021-08-08 DIAGNOSIS — I10 Essential (primary) hypertension: Secondary | ICD-10-CM | POA: Diagnosis not present

## 2021-08-08 DIAGNOSIS — I350 Nonrheumatic aortic (valve) stenosis: Secondary | ICD-10-CM | POA: Diagnosis not present

## 2021-08-08 DIAGNOSIS — E785 Hyperlipidemia, unspecified: Secondary | ICD-10-CM | POA: Diagnosis not present

## 2021-08-08 DIAGNOSIS — I5032 Chronic diastolic (congestive) heart failure: Secondary | ICD-10-CM | POA: Diagnosis not present

## 2021-08-09 LAB — BASIC METABOLIC PANEL
BUN/Creatinine Ratio: 14 (ref 12–28)
BUN: 25 mg/dL (ref 10–36)
CO2: 28 mmol/L (ref 20–29)
Calcium: 9.4 mg/dL (ref 8.7–10.3)
Chloride: 101 mmol/L (ref 96–106)
Creatinine, Ser: 1.8 mg/dL — ABNORMAL HIGH (ref 0.57–1.00)
Glucose: 141 mg/dL — ABNORMAL HIGH (ref 70–99)
Potassium: 4.2 mmol/L (ref 3.5–5.2)
Sodium: 143 mmol/L (ref 134–144)
eGFR: 26 mL/min/{1.73_m2} — ABNORMAL LOW (ref >59)

## 2021-08-10 ENCOUNTER — Encounter: Payer: Self-pay | Admitting: Podiatrist

## 2021-08-10 ENCOUNTER — Telehealth: Payer: Self-pay | Admitting: Cardiology

## 2021-08-10 ENCOUNTER — Ambulatory Visit: Payer: Medicare HMO | Admitting: Podiatrist

## 2021-08-10 DIAGNOSIS — M2041 Other hammer toe(s) (acquired), right foot: Secondary | ICD-10-CM

## 2021-08-10 DIAGNOSIS — B353 Tinea pedis: Secondary | ICD-10-CM | POA: Diagnosis not present

## 2021-08-10 DIAGNOSIS — L84 Corns and callosities: Secondary | ICD-10-CM

## 2021-08-10 MED ORDER — KETOCONAZOLE 2 % EX CREA: TOPICAL_CREAM | CUTANEOUS | 2 refills | Status: DC

## 2021-08-10 NOTE — Telephone Encounter (Signed)
[facility] calling requesting call back in regards to orders that need to be signed by Ermalinda Barrios, PA, for this patient. They said they were waiting for these, but have not received them yet.

## 2021-08-10 NOTE — Telephone Encounter (Signed)
Order has been signed and faxed back to [facility]. We had ordered a BMET but when they drew the labs they put the order in as PSA, Testosterone and DHEA.

## 2021-08-10 NOTE — Progress Notes (Signed)
HPI: Patient is 88 y.o. female who presents today with her daughter for pain and burning between the right toes 4/5.  She relates she has had a broken right fourth toe in the past and the toenail of the fifth toe rubs on the fourth toe.  She also relates she has a burning feeling down between the toes.  She states she has treated this problem in the past with a foam toe protector which offers minimal help.  She relates she has had this problem for 50 years.    Patient Active Problem List   Diagnosis Date Noted   Chronic respiratory failure with hypoxia and hypercapnia (HCC) 07/14/2021   Upper airway cough syndrome 06/26/2021   Coronary artery disease    CVA (cerebral vascular accident) [facility])    Hyperlipidemia    Hypertension    Thoracic aortic atherosclerosis ([place])    Peripheral vascular disease, unspecified ([place]) status post right carotic endarterectomy 08/28/2018   Chronic respiratory failure with hypoxia ([place]) 28/36/6294   Diastolic congestive heart failure ([place]) 07/31/2018   Greater trochanteric bursitis of right hip 11/13/2017   Sacroiliitis ([place]) 11/13/2017   Sacroiliac inflammation ([place]) 11/13/2017   COPD GOLD 2  12/20/2016   DOE (dyspnea on exertion) 12/11/2016   Smoking 05/23/2015   Aortic stenosis 11/09/2014   Late effects of CVA (cerebrovascular accident) 11/09/2014   Stroke ([place]) 09/08/2013   Hyperlipemia 08/18/2013   Essential hypertension 08/18/2013   Spinal stenosis of lumbar region with neurogenic claudication 08/18/2013   Lumbar degenerative disc disease 06/18/2013   Lumbar spondylosis 06/18/2013    Current Outpatient Medications on File Prior to Visit  Medication Sig Dispense Refill   albuterol (VENTOLIN HFA) 108 (90 Base) MCG/ACT inhaler Inhale 1 puff into the lungs as needed for wheezing or shortness of breath.     ALPRAZolam (XANAX) 0.25 MG tablet Take 0.25 mg by mouth daily as needed.     amLODipine (NORVASC) 5 MG tablet Take 5 mg by mouth daily.      atorvastatin (LIPITOR) 40 MG tablet Take 1 tablet (40 mg total) by mouth daily. 90 tablet 2   budesonide (PULMICORT) 0.25 MG/2ML nebulizer solution Take 2 mLs (0.25 mg total) by nebulization 2 (two) times daily. 60 mL 3   clopidogrel (PLAVIX) 75 MG tablet Take 75 mg by mouth daily.      Cyanocobalamin (VITAMIN B12 PO) Take 1 tablet by mouth daily. Unknown strenght     famotidine (PEPCID) 20 MG tablet One after supper 30 tablet 11   furosemide (LASIX) 40 MG tablet Take 1 tablet (40 mg total) by mouth daily. Take an extra '40mg'$  with weight gain >3lbs overnight or 5lbs in 1 week. 90 tablet 3   ipratropium-albuterol (DUONEB) 0.5-2.5 (3) MG/3ML SOLN Take 3 mLs by nebulization every 4 (four) hours as needed (Wheezing  and SOB). 360 mL 3   Lactobacillus Rhamnosus, GG, (CULTURELLE) CAPS Take 1 capsule by mouth daily.     levothyroxine (SYNTHROID, LEVOTHROID) 50 MCG tablet Take 1 tablet daily by mouth.     lidocaine (LIDODERM) 5 % as needed for pain.     magnesium oxide (MAG-OX) 400 (240 Mg) MG tablet Take 1 tablet by mouth 2 (two) times daily.     metoprolol tartrate (LOPRESSOR) 25 MG tablet TAKE 1 TABLET BY MOUTH 2 TIMES DAILY. 180 tablet 0   OXYGEN Inhale 2 L into the lungs as needed (Mainly at bedtime). 2lpm as needed     pantoprazole (PROTONIX) 40 MG tablet Take  40 mg by mouth daily.     potassium chloride (MICRO-K) 10 MEQ CR capsule Take 1 capsule (10 mEq total) by mouth 2 (two) times daily. (Patient taking differently: Take 10 mEq by mouth daily.) 180 capsule 2   traZODone (DESYREL) 50 MG tablet Take 50 mg by mouth as needed for sleep.     trimethoprim (TRIMPEX) 100 MG tablet Take 50 mg by mouth daily.     Vitamin D, Ergocalciferol, (DRISDOL) 1.25 MG (50000 UT) CAPS capsule Take 50,000 Units by mouth 2 (two) times a week.     No current facility-administered medications on file prior to visit.    Allergies  Allergen Reactions   Codeine Hives and Nausea Only    Review of Systems No fevers,  chills, nausea, muscle aches, no difficulty breathing, no calf pain, no chest pain or shortness of breath.   Physical Exam  GENERAL APPEARANCE: Alert, conversant. Appropriately groomed. No acute distress.  VASCULAR: Pedal pulses palpable at 1/4 DP and PT right foot.   Capillary refill time is immediate to all digits,  Proximal to distal cooling it warm to warm.    NEUROLOGIC: sensation is intact epicritically and protectively to 5.07 monofilament at 5/5 sites right   Light touch is intact bilateral, vibratory sensation intact bilateral, achilles tendon reflex is intact right  MUSCULOSKELETAL: acceptable muscle strength, tone and stability right  adductus deformity of the fourth toe at the dipj from prior injury to the toe noted.   Hammertoe deformities 234 right foot pain to palpation about the fourth and fifth toes at the PIPJ fourth DIPJ fifth  DERM:  corn medial right fifth toe noted from friction of rubbing on the fourth toe dipj.  Minimal helloma molle noted fourth interspace.  Slight peeling of skin in interspace noted which could be fungal in nature. No interdigital maceration or open wounds noted.   Digital fifth nail is elongated and rubbing on the fourth toe. Remainder of nails are slightly long but not thickened     Assessment     ICD-10-CM   1. Hammertoe of right foot  M20.41     2. Corn of toe  L84     3. Tinea pedis of right foot  B35.3        Plan  Discussed exam findings with the patient and her daughter.  I pared the corn on the medial aspect of the right fifth toe without complication.  I recommended ketoconazole to place between the toes for 3 weeks.  I recommended toe caps.  Also discussed lambswool as an option.  Discussed that surgical straightening of the right fourth toe could be a potential option in the future should all conservative therapies fail she will be seen back as needed for follow-up and will call if any problems or concerns arise.

## 2021-08-14 ENCOUNTER — Ambulatory Visit: Payer: Medicare HMO | Admitting: Cardiology

## 2021-08-14 ENCOUNTER — Encounter: Payer: Self-pay | Admitting: Cardiology

## 2021-08-14 ENCOUNTER — Other Ambulatory Visit: Payer: Self-pay | Admitting: *Deleted

## 2021-08-14 ENCOUNTER — Other Ambulatory Visit: Payer: Self-pay

## 2021-08-14 VITALS — BP 130/48 | HR 71 | Ht 59.0 in | Wt 148.1 lb

## 2021-08-14 DIAGNOSIS — I739 Peripheral vascular disease, unspecified: Secondary | ICD-10-CM

## 2021-08-14 DIAGNOSIS — I350 Nonrheumatic aortic (valve) stenosis: Secondary | ICD-10-CM | POA: Diagnosis not present

## 2021-08-14 DIAGNOSIS — J9611 Chronic respiratory failure with hypoxia: Secondary | ICD-10-CM

## 2021-08-14 DIAGNOSIS — I10 Essential (primary) hypertension: Secondary | ICD-10-CM

## 2021-08-14 DIAGNOSIS — N289 Disorder of kidney and ureter, unspecified: Secondary | ICD-10-CM

## 2021-08-14 DIAGNOSIS — I5032 Chronic diastolic (congestive) heart failure: Secondary | ICD-10-CM | POA: Diagnosis not present

## 2021-08-14 MED ORDER — ALBUTEROL SULFATE HFA 108 (90 BASE) MCG/ACT IN AERS
1.0000 | INHALATION_SPRAY | RESPIRATORY_TRACT | 3 refills | Status: AC | PRN
Start: 2021-08-14 — End: ?

## 2021-08-14 MED ORDER — BUDESONIDE 0.25 MG/2ML IN SUSP: 0.2500 mg | Freq: Two times a day (BID) | RESPIRATORY_TRACT | 3 refills | Status: DC

## 2021-08-14 NOTE — Patient Instructions (Addendum)
Medication Instructions:  Your physician recommends that you continue on your current medications as directed. Please refer to the Current Medication list given to you today.  *If you need a refill on your cardiac medications before your next appointment, please call your pharmacy*   Lab Work: 2nd [place] [address] BMP, Pro-BNP- Today If you have labs (blood work) drawn today and your tests are completely normal, you will receive your results only by: [place] (if you have MyChart) OR A paper copy in the mail If you have any lab test that is abnormal or we need to change your treatment, we will call you to review the results.   Testing/Procedures: None Ordered   Follow-Up: At [facility], you and your health needs are our priority.  As part of our continuing mission to provide you with exceptional heart care, we have created designated Provider Care Teams.  These Care Teams include your primary Cardiologist (physician) and Advanced Practice Providers (APPs -  Physician Assistants and Nurse Practitioners) who all work together to provide you with the care you need, when you need it.  We recommend signing up for the patient portal called "MyChart".  Sign up information is provided on this After Visit Summary.  MyChart is used to connect with patients for Virtual Visits (Telemedicine).  Patients are able to view lab/test results, encounter notes, upcoming appointments, etc.  Non-urgent messages can be sent to your provider as well.   To learn more about what you can do with MyChart, go to [email or web address].    Your next appointment:   3 month(s)  The format for your next appointment:   In Person  Provider:   Jenne Campus, MD    Other Instructions Referral to Nephrologist- They will call for appt

## 2021-08-14 NOTE — Progress Notes (Unsigned)
Cardiology Office Note:    Date:  08/14/2021   ID:  Annette Huang, DOB 03/31/1927, MRN [id number]  PCP:  Annette Sheriff, MD  Cardiologist:  Annette Campus, MD    Referring MD: Annette Sheriff, MD   Chief Complaint  Patient presents with   Shortness of Breath   Leg Swelling    Worst on the Left side     History of Present Illness:    Annette Huang is a 88 y.o. female that is complex situation past medical history significant for advanced COPD, oxygen dependent repeated steroids used, moderate aortic stenosis based on last assessment in March, he is she is a chronic smoker however quit in May, carotic arterial disease status post carotic endarterectomy, essential hypertension dyslipidemia.  Recently she struggled with shortness of breath.  She was given a course of steroids which seems to be improving her shortness of breath, she was also given high dose of diuretics however that led to kidney dysfunction diuretics being held and then cut down with improvement of kidney function.  She is coming today to my office for follow-up shortness of breath is still there however she seems to be comfortable sitting in the chair she is oxygen all the time.  She can do very little because of shortness of breath.  She also had swelling of lower extremities which is gone today she took extra dose of Lasix she was taking 40 mg twice daily of furosemide for the last 3 days which resulted in improvement in swelling to normalization.  Past Medical History:  Diagnosis Date   Aortic stenosis 11/09/2014   Overview:  Mild in 2015 Moderate to severe in the October 2018   Chronic respiratory failure with hypoxia (HCC) 08/06/2018   02 hs x 2lpm as of 08/05/2018 and prn daytime to keep sats > 90%  - 09/01/2018 no desats walking / limited by back    COPD (chronic obstructive pulmonary disease) (HCC)    Coronary artery disease    CVA (cerebral vascular accident) ([place])    Diastolic congestive heart failure  ([place]) 07/31/2018   DOE (dyspnea on exertion) 12/11/2016   Quit smoking 07/22/18 with emphysema on ct from 06/12/2018 - PFT 02/17/17    FEV1 1.35 [118%], ratio 0.82 with 14% resp to saba and TLC 93%, DLCO 73% - Echo 01/20/2018 c/w mod AS s PH - pt stopped vasotec and anoro on her own prior to pulmonary ov 08/05/2018 > rec leave off and rx for gerd x 4 weeks > improved as of 09/01/2018  - 09/01/2018   Walked RA x one lap =  approx 250 ft avg pace >> stopped due to   Essential hypertension 08/18/2013   rec Off acei permanently as of 08/05/2018 due to pseudowheeze    Greater trochanteric bursitis of right hip 11/13/2017   Hyperlipemia 08/18/2013   Hyperlipidemia    Hypertension    Late effects of CVA (cerebrovascular accident) 11/09/2014   Lumbar degenerative disc disease 06/18/2013   Lumbar spondylosis 06/18/2013   Peripheral vascular disease, unspecified ([place]) status post right carotic endarterectomy 08/28/2018   Sacroiliac inflammation ([place]) 11/13/2017   Sacroiliitis ([place]) 11/13/2017   Smoking 05/23/2015   Spinal stenosis of lumbar region with neurogenic claudication 08/18/2013   Stroke ([place]) 09/08/2013   Thoracic aortic atherosclerosis ([place])     Past Surgical History:  Procedure Laterality Date   CAROTID ENDARTERECTOMY Bilateral    NECK SURGERY      Current Medications: Current  Meds  Medication Sig   albuterol (VENTOLIN HFA) 108 (90 Base) MCG/ACT inhaler Inhale 1 puff into the lungs as needed for wheezing or shortness of breath.   ALPRAZolam (XANAX) 0.25 MG tablet Take 0.25 mg by mouth daily as needed for anxiety or sleep.   amLODipine (NORVASC) 5 MG tablet Take 5 mg by mouth daily.   atorvastatin (LIPITOR) 40 MG tablet Take 1 tablet (40 mg total) by mouth daily.   budesonide (PULMICORT) 0.25 MG/2ML nebulizer solution Take 2 mLs (0.25 mg total) by nebulization 2 (two) times daily.   clopidogrel (PLAVIX) 75 MG tablet Take 75 mg by mouth daily.    Cyanocobalamin (VITAMIN B12 PO) Take 1 tablet by mouth  daily. Unknown strenght   famotidine (PEPCID) 20 MG tablet One after supper (Patient taking differently: Take 20 mg by mouth daily. One after supper)   furosemide (LASIX) 40 MG tablet Take 1 tablet (40 mg total) by mouth daily. Take an extra '40mg'$  with weight gain >3lbs overnight or 5lbs in 1 week.   ipratropium-albuterol (DUONEB) 0.5-2.5 (3) MG/3ML SOLN Take 3 mLs by nebulization every 4 (four) hours as needed (Wheezing  and SOB).   ketoconazole (NIZORAL) 2 % cream Apply between toes 4 and 5 of right foot for 3 weeks for burning pain-  twice daily (Patient taking differently: Apply 1 Application topically as needed for irritation. Apply between toes 4 and 5 of right foot for 3 weeks for burning pain-  twice daily)   Lactobacillus Rhamnosus, GG, (CULTURELLE) CAPS Take 1 capsule by mouth daily.   levothyroxine (SYNTHROID, LEVOTHROID) 50 MCG tablet Take 1 tablet daily by mouth.   lidocaine (LIDODERM) 5 % 1 patch as needed for pain.   magnesium oxide (MAG-OX) 400 (240 Mg) MG tablet Take 1 tablet by mouth 2 (two) times daily.   metoprolol tartrate (LOPRESSOR) 25 MG tablet TAKE 1 TABLET BY MOUTH 2 TIMES DAILY. (Patient taking differently: Take 25 mg by mouth 2 (two) times daily.)   OXYGEN Inhale 2 L into the lungs as needed (Mainly at bedtime). 2lpm as needed   pantoprazole (PROTONIX) 40 MG tablet Take 40 mg by mouth daily.   potassium chloride (MICRO-K) 10 MEQ CR capsule Take 1 capsule (10 mEq total) by mouth 2 (two) times daily. (Patient taking differently: Take 10 mEq by mouth daily.)   traZODone (DESYREL) 50 MG tablet Take 50 mg by mouth as needed for sleep.   trimethoprim (TRIMPEX) 100 MG tablet Take 50 mg by mouth daily.   Vitamin D, Ergocalciferol, (DRISDOL) 1.25 MG (50000 UT) CAPS capsule Take 50,000 Units by mouth 2 (two) times a week.     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  Tobacco Use   Smoking status: Former    Packs/day: 1.00    Years: 35.00    Total pack years: 35.00    Types: Cigarettes    Quit date: 2023    Years since quitting: 0.5   Smokeless tobacco: Never  Vaping Use   Vaping Use: Former  Substance and Sexual Activity   Alcohol use: No   Drug use: No   Sexual activity: Not on file  Other Topics Concern  Not on file  Social History Narrative   Not on file   Social Determinants of Health   Financial Resource Strain: Not on file  Food Insecurity: Not on file  Transportation Needs: Not on file  Physical Activity: Not on file  Stress: Not on file  Social Connections: Not on file     Family History: The patient's family history includes Heart disease in her father and mother; Hypertension in her mother. ROS:   Please see the history of present illness.    All 14 point review of systems negative except as described per history of present illness  EKGs/Labs/Other Studies Reviewed:      Recent Labs: 07/18/2021: NT-Pro BNP 865 08/08/2021: BUN 25; Creatinine, Ser 1.80; Potassium 4.2; Sodium 143  Recent Lipid Panel    Component Value Date/Time   CHOL 132 09/14/2019 1006   TRIG 59 09/14/2019 1006   HDL 54 09/14/2019 1006   CHOLHDL 2.4 09/14/2019 1006   LDLCALC 65 09/14/2019 1006    Physical Exam:    VS:  BP (!) 130/48 (BP Location: Left Arm, Patient Position: Sitting)   Pulse 71   Ht '4\' 11"'$  (1.499 m)   Wt 148 lb 1.3 oz (67.2 kg)   SpO2 92%   BMI 29.91 kg/m     Wt Readings from Last 3 Encounters:  08/14/21 148 lb 1.3 oz (67.2 kg)  07/31/21 151 lb (68.5 kg)  07/30/21 151 lb 9.6 oz (68.8 kg)     GEN:  Well nourished, well developed in no acute distress HEENT: Normal NECK: No JVD; No carotid bruits LYMPHATICS: No lymphadenopathy CARDIAC: RRR, systolic ejection murmur grade 3/6 right  upper portion of the sternum, no rubs, no gallops RESPIRATORY: Poor entry bilaterally, bilateral wheezes ABDOMEN: Soft, non-tender, non-distended MUSCULOSKELETAL:  No edema; No deformity  SKIN: Warm and dry LOWER EXTREMITIES: no swelling NEUROLOGIC:  Alert and oriented x 3 PSYCHIATRIC:  Normal affect   ASSESSMENT:    1. Nonrheumatic aortic valve stenosis   2. Essential hypertension   3. Chronic diastolic congestive heart failure ([place])   4. Peripheral vascular disease, unspecified ([place]) status post right carotic endarterectomy   5. Chronic respiratory failure with hypoxia (HCC)    PLAN:    In order of problems listed above:  Nonrheumatic aortic valve stenosis which is moderate last assessment done in March.  We will continue monitoring. Chronic diastolic congestive heart failure proBNP Chem-7 today trying to determine what part of her symptomatology is related to her heart.  Also the fact that she took extra Lasix I will check her kidney function.  She also will be referred to nephrologist. Essential hypertension: Blood pressure seems to be well controlled, will continue present management. Peripheral vascular disease.  Stable COPD.  Followed by pulmonary.  Overall is a very difficult situation the question is is this related to her lung so is this related to her heart I suspect majority of her symptomatology is lung related.  We will do proBNP as well Chem-7   Medication Adjustments/Labs and Tests Ordered: Current medicines are reviewed at length with the patient today.  Concerns regarding medicines are outlined above.  No orders of the defined types were placed in this encounter.  Medication changes: No orders of the defined types were placed in this encounter.   Signed, Park Liter, MD, [facility] 08/14/2021 9:25 AM    [place]

## 2021-08-15 LAB — BASIC METABOLIC PANEL
BUN/Creatinine Ratio: 13 (ref 12–28)
BUN: 26 mg/dL (ref 10–36)
CO2: 30 mmol/L — ABNORMAL HIGH (ref 20–29)
Calcium: 10.5 mg/dL — ABNORMAL HIGH (ref 8.7–10.3)
Chloride: 98 mmol/L (ref 96–106)
Creatinine, Ser: 1.98 mg/dL — ABNORMAL HIGH (ref 0.57–1.00)
Glucose: 114 mg/dL — ABNORMAL HIGH (ref 70–99)
Potassium: 5.1 mmol/L (ref 3.5–5.2)
Sodium: 141 mmol/L (ref 134–144)
eGFR: 23 mL/min/{1.73_m2} — ABNORMAL LOW (ref >59)

## 2021-08-15 LAB — PRO B NATRIURETIC PEPTIDE: NT-Pro BNP: 801 pg/mL — ABNORMAL HIGH (ref 0–738)

## 2021-08-21 ENCOUNTER — Telehealth: Payer: Self-pay

## 2021-08-21 ENCOUNTER — Telehealth: Payer: Self-pay | Admitting: Internal Medicine

## 2021-08-21 NOTE — Telephone Encounter (Signed)
-----   Message from Park Liter, MD sent at 08/16/2021 12:42 PM EDT ----- She takes 40 mg of Lasix every day, I would advise her to take 40 mg of Lasix 1 day and alternate with 60, Chem-7 need to be repeated with proBNP in about 10 days

## 2021-08-21 NOTE — Addendum Note (Signed)
Addended by: Truddie Hidden on: 08/21/2021 03:28 PM   Modules accepted: Orders

## 2021-08-22 MED ORDER — BUDESONIDE 0.25 MG/2ML IN SUSP: 0.2500 mg | Freq: Two times a day (BID) | RESPIRATORY_TRACT | 3 refills | Status: DC

## 2021-08-22 NOTE — Telephone Encounter (Signed)
Placed new order for budesonide and for patient to receive more than one box. Order placed. Nothing further needed

## 2021-08-28 DIAGNOSIS — C44319 Basal cell carcinoma of skin of other parts of face: Secondary | ICD-10-CM | POA: Diagnosis not present

## 2021-08-28 DIAGNOSIS — D485 Neoplasm of uncertain behavior of skin: Secondary | ICD-10-CM | POA: Diagnosis not present

## 2021-08-30 ENCOUNTER — Encounter: Payer: Self-pay | Admitting: Cardiology

## 2021-08-31 DIAGNOSIS — N289 Disorder of kidney and ureter, unspecified: Secondary | ICD-10-CM | POA: Diagnosis not present

## 2021-08-31 DIAGNOSIS — I5032 Chronic diastolic (congestive) heart failure: Secondary | ICD-10-CM | POA: Diagnosis not present

## 2021-09-01 LAB — PRO B NATRIURETIC PEPTIDE: NT-Pro BNP: 572 pg/mL (ref 0–738)

## 2021-09-01 LAB — BASIC METABOLIC PANEL
BUN/Creatinine Ratio: 16 (ref 12–28)
BUN: 32 mg/dL (ref 10–36)
CO2: 30 mmol/L — ABNORMAL HIGH (ref 20–29)
Calcium: 9.4 mg/dL (ref 8.7–10.3)
Chloride: 101 mmol/L (ref 96–106)
Creatinine, Ser: 2.01 mg/dL — ABNORMAL HIGH (ref 0.57–1.00)
Glucose: 111 mg/dL — ABNORMAL HIGH (ref 70–99)
Potassium: 4.4 mmol/L (ref 3.5–5.2)
Sodium: 141 mmol/L (ref 134–144)
eGFR: 23 mL/min/{1.73_m2} — ABNORMAL LOW (ref >59)

## 2021-09-04 ENCOUNTER — Encounter: Payer: Self-pay | Admitting: Internal Medicine

## 2021-09-04 DIAGNOSIS — J9611 Chronic respiratory failure with hypoxia: Secondary | ICD-10-CM | POA: Diagnosis not present

## 2021-09-04 DIAGNOSIS — E031 Congenital hypothyroidism without goiter: Secondary | ICD-10-CM

## 2021-09-04 DIAGNOSIS — R5383 Other fatigue: Secondary | ICD-10-CM

## 2021-09-04 DIAGNOSIS — J449 Chronic obstructive pulmonary disease, unspecified: Secondary | ICD-10-CM | POA: Diagnosis not present

## 2021-09-04 DIAGNOSIS — M48062 Spinal stenosis, lumbar region with neurogenic claudication: Secondary | ICD-10-CM | POA: Diagnosis not present

## 2021-09-04 DIAGNOSIS — I5032 Chronic diastolic (congestive) heart failure: Secondary | ICD-10-CM | POA: Diagnosis not present

## 2021-09-04 DIAGNOSIS — I129 Hypertensive chronic kidney disease with stage 1 through stage 4 chronic kidney disease, or unspecified chronic kidney disease: Secondary | ICD-10-CM | POA: Diagnosis not present

## 2021-09-04 DIAGNOSIS — E039 Hypothyroidism, unspecified: Secondary | ICD-10-CM

## 2021-09-04 DIAGNOSIS — E782 Mixed hyperlipidemia: Secondary | ICD-10-CM | POA: Diagnosis not present

## 2021-09-04 DIAGNOSIS — I350 Nonrheumatic aortic (valve) stenosis: Secondary | ICD-10-CM | POA: Diagnosis not present

## 2021-09-04 HISTORY — DX: Other fatigue: R53.83

## 2021-09-04 HISTORY — DX: Hypothyroidism, unspecified: E03.9

## 2021-09-04 HISTORY — DX: Congenital hypothyroidism without goiter: E03.1

## 2021-09-05 ENCOUNTER — Telehealth: Payer: Self-pay

## 2021-09-05 NOTE — Telephone Encounter (Signed)
Spoke to [place], notified of results

## 2021-09-05 NOTE — Telephone Encounter (Signed)
-----   Message from Park Liter, MD sent at 09/05/2021 10:52 AM EDT ----- Artery congestive heart failure looks better based on lab work test, kidney function basically the same.  Advised continue present management

## 2021-09-14 DIAGNOSIS — N390 Urinary tract infection, site not specified: Secondary | ICD-10-CM | POA: Diagnosis not present

## 2021-09-14 DIAGNOSIS — K5900 Constipation, unspecified: Secondary | ICD-10-CM | POA: Diagnosis not present

## 2021-09-14 DIAGNOSIS — K219 Gastro-esophageal reflux disease without esophagitis: Secondary | ICD-10-CM | POA: Diagnosis not present

## 2021-09-14 DIAGNOSIS — I5032 Chronic diastolic (congestive) heart failure: Secondary | ICD-10-CM | POA: Diagnosis not present

## 2021-09-14 DIAGNOSIS — I110 Hypertensive heart disease with heart failure: Secondary | ICD-10-CM | POA: Diagnosis not present

## 2021-09-14 DIAGNOSIS — N179 Acute kidney failure, unspecified: Secondary | ICD-10-CM | POA: Diagnosis not present

## 2021-09-14 DIAGNOSIS — E785 Hyperlipidemia, unspecified: Secondary | ICD-10-CM | POA: Diagnosis not present

## 2021-09-14 DIAGNOSIS — I10 Essential (primary) hypertension: Secondary | ICD-10-CM | POA: Diagnosis not present

## 2021-09-14 DIAGNOSIS — Z66 Do not resuscitate: Secondary | ICD-10-CM | POA: Diagnosis not present

## 2021-09-14 DIAGNOSIS — I130 Hypertensive heart and chronic kidney disease with heart failure and stage 1 through stage 4 chronic kidney disease, or unspecified chronic kidney disease: Secondary | ICD-10-CM | POA: Diagnosis not present

## 2021-09-14 DIAGNOSIS — G4700 Insomnia, unspecified: Secondary | ICD-10-CM | POA: Diagnosis not present

## 2021-09-14 DIAGNOSIS — J9811 Atelectasis: Secondary | ICD-10-CM | POA: Diagnosis not present

## 2021-09-14 DIAGNOSIS — E039 Hypothyroidism, unspecified: Secondary | ICD-10-CM | POA: Diagnosis not present

## 2021-09-14 DIAGNOSIS — R635 Abnormal weight gain: Secondary | ICD-10-CM | POA: Diagnosis not present

## 2021-09-14 DIAGNOSIS — R0602 Shortness of breath: Secondary | ICD-10-CM | POA: Diagnosis not present

## 2021-09-14 DIAGNOSIS — J449 Chronic obstructive pulmonary disease, unspecified: Secondary | ICD-10-CM | POA: Diagnosis not present

## 2021-09-14 DIAGNOSIS — I509 Heart failure, unspecified: Secondary | ICD-10-CM | POA: Diagnosis not present

## 2021-09-14 DIAGNOSIS — N183 Chronic kidney disease, stage 3 unspecified: Secondary | ICD-10-CM | POA: Diagnosis not present

## 2021-09-15 DIAGNOSIS — N179 Acute kidney failure, unspecified: Secondary | ICD-10-CM | POA: Diagnosis not present

## 2021-09-15 DIAGNOSIS — E785 Hyperlipidemia, unspecified: Secondary | ICD-10-CM | POA: Diagnosis not present

## 2021-09-15 DIAGNOSIS — Z66 Do not resuscitate: Secondary | ICD-10-CM | POA: Diagnosis not present

## 2021-09-15 DIAGNOSIS — I130 Hypertensive heart and chronic kidney disease with heart failure and stage 1 through stage 4 chronic kidney disease, or unspecified chronic kidney disease: Secondary | ICD-10-CM | POA: Diagnosis not present

## 2021-09-15 DIAGNOSIS — N183 Chronic kidney disease, stage 3 unspecified: Secondary | ICD-10-CM | POA: Diagnosis not present

## 2021-09-15 DIAGNOSIS — K5900 Constipation, unspecified: Secondary | ICD-10-CM | POA: Diagnosis not present

## 2021-09-15 DIAGNOSIS — I5032 Chronic diastolic (congestive) heart failure: Secondary | ICD-10-CM | POA: Diagnosis not present

## 2021-09-15 DIAGNOSIS — N390 Urinary tract infection, site not specified: Secondary | ICD-10-CM | POA: Diagnosis not present

## 2021-09-15 DIAGNOSIS — J449 Chronic obstructive pulmonary disease, unspecified: Secondary | ICD-10-CM | POA: Diagnosis not present

## 2021-09-15 DIAGNOSIS — I129 Hypertensive chronic kidney disease with stage 1 through stage 4 chronic kidney disease, or unspecified chronic kidney disease: Secondary | ICD-10-CM | POA: Diagnosis not present

## 2021-09-15 DIAGNOSIS — E039 Hypothyroidism, unspecified: Secondary | ICD-10-CM | POA: Diagnosis not present

## 2021-09-15 DIAGNOSIS — K219 Gastro-esophageal reflux disease without esophagitis: Secondary | ICD-10-CM | POA: Diagnosis not present

## 2021-09-15 DIAGNOSIS — G4700 Insomnia, unspecified: Secondary | ICD-10-CM | POA: Diagnosis not present

## 2021-09-17 ENCOUNTER — Encounter: Payer: Self-pay | Admitting: Cardiology

## 2021-09-18 ENCOUNTER — Ambulatory Visit: Payer: Medicare HMO | Admitting: Cardiology

## 2021-09-18 VITALS — BP 98/58 | HR 67 | Ht 59.0 in | Wt 145.2 lb

## 2021-09-18 DIAGNOSIS — I5032 Chronic diastolic (congestive) heart failure: Secondary | ICD-10-CM | POA: Diagnosis not present

## 2021-09-18 DIAGNOSIS — I10 Essential (primary) hypertension: Secondary | ICD-10-CM | POA: Diagnosis not present

## 2021-09-18 DIAGNOSIS — R0609 Other forms of dyspnea: Secondary | ICD-10-CM

## 2021-09-18 DIAGNOSIS — I6990 Unspecified sequelae of unspecified cerebrovascular disease: Secondary | ICD-10-CM | POA: Diagnosis not present

## 2021-09-18 DIAGNOSIS — I350 Nonrheumatic aortic (valve) stenosis: Secondary | ICD-10-CM

## 2021-09-18 NOTE — Patient Instructions (Addendum)
Medication Instructions:  Your physician recommends that you continue on your current medications as directed. Please refer to the Current Medication list given to you today.  *If you need a refill on your cardiac medications before your next appointment, please call your pharmacy*   Lab Work: Your physician recommends that you return for lab work in: BMP today If you have labs (blood work) drawn today and your tests are completely normal, you will receive your results only by: [place] (if you have MyChart) OR A paper copy in the mail If you have any lab test that is abnormal or we need to change your treatment, we will call you to review the results.   Testing/Procedures: Your physician has requested that you have an echocardiogram. Echocardiography is a painless test that uses sound waves to create images of your heart. It provides your doctor with information about the size and shape of your heart and how well your heart's chambers and valves are working. This procedure takes approximately one hour. There are no restrictions for this procedure.    Follow-Up: At [facility], you and your health needs are our priority.  As part of our continuing mission to provide you with exceptional heart care, we have created designated Provider Care Teams.  These Care Teams include your primary Cardiologist (physician) and Advanced Practice Providers (APPs -  Physician Assistants and Nurse Practitioners) who all work together to provide you with the care you need, when you need it.  We recommend signing up for the patient portal called "MyChart".  Sign up information is provided on this After Visit Summary.  MyChart is used to connect with patients for Virtual Visits (Telemedicine).  Patients are able to view lab/test results, encounter notes, upcoming appointments, etc.  Non-urgent messages can be sent to your provider as well.   To learn more about what you can do with MyChart, go to  [email or web address].    Your next appointment:   2 month(s)  The format for your next appointment:   In Person  Provider:   Jenne Campus, MD    Other Instructions   Important Information About [place]

## 2021-09-18 NOTE — Addendum Note (Signed)
Addended by: Darrel Reach on: 09/18/2021 11:23 AM   Modules accepted: Orders

## 2021-09-18 NOTE — Progress Notes (Signed)
Cardiology Office Note:    Date:  09/18/2021   ID:  Annette Huang, DOB Jun 20, 1927, MRN [id number]  PCP:  Angelina Sheriff, MD  Cardiologist:  Jenne Campus, MD    Referring MD: Angelina Sheriff, MD   Chief Complaint  Patient presents with   Hospitalization Follow-up    History of Present Illness:    Annette Huang is a 88 y.o. female with past medical history significant for moderate aortic stenosis, last assessment in March of this year, COPD oxygen dependent today, repeated steroid use, diastolic congestive heart failure.  She quit smoking in March.  Recently she ended up going to the emergency room in hospital because of weight gain.  She gained about 3 pounds within the last 1 day and she was in the being sent to hospital.  She was very disappointed with the hospitalization.  They said there is nothing to me except stop taking diuretic.  She is doing well now she does have some shortness of breath, she is oxygen.  She walk around with a walker.  Swelling of lower extremities is gone and overall seems to be doing quite well.  Past Medical History:  Diagnosis Date   Aortic stenosis 11/09/2014   Overview:  Mild in 2015 Moderate to severe in the October 2018   Chronic respiratory failure with hypoxia (HCC) 08/06/2018   02 hs x 2lpm as of 08/05/2018 and prn daytime to keep sats > 90%  - 09/01/2018 no desats walking / limited by back    COPD (chronic obstructive pulmonary disease) (HCC)    Coronary artery disease    CVA (cerebral vascular accident) ([place])    Diastolic congestive heart failure ([place]) 07/31/2018   DOE (dyspnea on exertion) 12/11/2016   Quit smoking 07/22/18 with emphysema on ct from 06/12/2018 - PFT 02/17/17    FEV1 1.35 [118%], ratio 0.82 with 14% resp to saba and TLC 93%, DLCO 73% - Echo 01/20/2018 c/w mod AS s PH - pt stopped vasotec and anoro on her own prior to pulmonary ov 08/05/2018 > rec leave off and rx for gerd x 4 weeks > improved as of 09/01/2018  - 09/01/2018    Walked RA x one lap =  approx 250 ft avg pace >> stopped due to   Essential hypertension 08/18/2013   rec Off acei permanently as of 08/05/2018 due to pseudowheeze    Greater trochanteric bursitis of right hip 11/13/2017   Hyperlipemia 08/18/2013   Hyperlipidemia    Hypertension    Late effects of CVA (cerebrovascular accident) 11/09/2014   Lumbar degenerative disc disease 06/18/2013   Lumbar spondylosis 06/18/2013   Peripheral vascular disease, unspecified ([place]) status post right carotic endarterectomy 08/28/2018   Sacroiliac inflammation ([place]) 11/13/2017   Sacroiliitis ([place]) 11/13/2017   Smoking 05/23/2015   Spinal stenosis of lumbar region with neurogenic claudication 08/18/2013   Stroke ([place]) 09/08/2013   Thoracic aortic atherosclerosis (HCC)     Past Surgical History:  Procedure Laterality Date   CAROTID ENDARTERECTOMY Bilateral    NECK SURGERY      Current Medications: Current Meds  Medication Sig   albuterol (VENTOLIN HFA) 108 (90 Base) MCG/ACT inhaler Inhale 1 puff into the lungs as needed for wheezing or shortness of breath.   ALPRAZolam (XANAX) 0.25 MG tablet Take 0.25 mg by mouth daily as needed for anxiety or sleep.   amLODipine (NORVASC) 5 MG tablet Take 5 mg by mouth daily.   atorvastatin (LIPITOR) 40 MG tablet  Take 1 tablet (40 mg total) by mouth daily.   budesonide (PULMICORT) 0.25 MG/2ML nebulizer solution Take 2 mLs (0.25 mg total) by nebulization 2 (two) times daily.   clopidogrel (PLAVIX) 75 MG tablet Take 75 mg by mouth daily.    Cyanocobalamin (VITAMIN B12 PO) Take 1 tablet by mouth daily. Unknown strenght   famotidine (PEPCID) 20 MG tablet One after supper (Patient taking differently: Take 20 mg by mouth daily. One after supper)   ipratropium-albuterol (DUONEB) 0.5-2.5 (3) MG/3ML SOLN Take 3 mLs by nebulization every 4 (four) hours as needed (Wheezing  and SOB).   ketoconazole (NIZORAL) 2 % cream Apply between toes 4 and 5 of right foot for 3 weeks for burning pain-   twice daily (Patient taking differently: Apply 1 Application topically as needed for irritation. Apply between toes 4 and 5 of right foot for 3 weeks for burning pain-  twice daily)   Lactobacillus Rhamnosus, GG, (CULTURELLE) CAPS Take 1 capsule by mouth daily.   levothyroxine (SYNTHROID, LEVOTHROID) 50 MCG tablet Take 1 tablet daily by mouth.   lidocaine (LIDODERM) 5 % Place 1 patch onto the skin as needed for pain.   magnesium oxide (MAG-OX) 400 (240 Mg) MG tablet Take 1 tablet by mouth 2 (two) times daily.   metoprolol tartrate (LOPRESSOR) 25 MG tablet TAKE 1 TABLET BY MOUTH 2 TIMES DAILY. (Patient taking differently: Take 25 mg by mouth 2 (two) times daily.)   OXYGEN Inhale 2 L into the lungs as needed (Mainly at bedtime). 2lpm as needed   pantoprazole (PROTONIX) 40 MG tablet Take 40 mg by mouth daily.   potassium chloride (MICRO-K) 10 MEQ CR capsule Take 1 capsule (10 mEq total) by mouth 2 (two) times daily. (Patient taking differently: Take 10 mEq by mouth daily.)   traZODone (DESYREL) 50 MG tablet Take 50 mg by mouth as needed for sleep.   trimethoprim (TRIMPEX) 100 MG tablet Take 50 mg by mouth daily.     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  Tobacco Use   Smoking status: Former    Packs/day: 1.00    Years: 35.00    Total pack years: 35.00    Types: Cigarettes    Quit date: 2023    Years since quitting: 0.6   Smokeless tobacco: Never  Vaping Use   Vaping Use: Former  Substance and Sexual Activity   Alcohol use: No   Drug use: No   Sexual activity: Not on file  Other Topics Concern   Not on file  Social History Narrative   Not on file   Social Determinants of Health   Financial Resource Strain: Not on file  Food  Insecurity: Not on file  Transportation Needs: Not on file  Physical Activity: Not on file  Stress: Not on file  Social Connections: Not on file     Family History: The patient's family history includes Heart disease in her father and mother; Hypertension in her mother. ROS:   Please see the history of present illness.    All 14 point review of systems negative except as described per history of present illness  EKGs/Labs/Other Studies Reviewed:      Recent  Labs: 08/31/2021: BUN 32; Creatinine, Ser 2.01; NT-Pro BNP 572; Potassium 4.4; Sodium 141  Recent Lipid Panel    Component Value Date/Time   CHOL 132 09/14/2019 1006   TRIG 59 09/14/2019 1006   HDL 54 09/14/2019 1006   CHOLHDL 2.4 09/14/2019 1006   LDLCALC 65 09/14/2019 1006    Physical Exam:    VS:  BP (!) 98/58 (BP Location: Left Arm, Patient Position: Sitting)   Pulse 67   Ht '4\' 11"'$  (1.499 m)   Wt 145 lb 3.2 oz (65.9 kg)   SpO2 96%   BMI 29.33 kg/m     Wt Readings from Last 3 Encounters:  09/18/21 145 lb 3.2 oz (65.9 kg)  08/14/21 148 lb 1.3 oz (67.2 kg)  07/31/21 151 lb (68.5 kg)     GEN:  Well nourished, well developed in no acute distress HEENT: Normal NECK: No JVD; No carotid bruits LYMPHATICS: No lymphadenopathy CARDIAC: RRR, systolic ejection murmur grade 2/6 to 3/6 best heard right upper portion of sternum no rubs, no gallops RESPIRATORY:  Clear to auscultation without rales, wheezing or rhonchi  ABDOMEN: Soft, non-tender, non-distended MUSCULOSKELETAL:  No edema; No deformity  SKIN: Warm and dry LOWER EXTREMITIES: no swelling NEUROLOGIC:  Alert and oriented x 3 PSYCHIATRIC:  Normal affect   ASSESSMENT:    1. Nonrheumatic aortic valve stenosis   2. Chronic diastolic congestive heart failure ([place])   3. Primary hypertension   4. Late effects of CVA (cerebrovascular accident)   5. DOE (dyspnea on exertion)    PLAN:    In order of problems listed above:  Nonrheumatic aortic valve stenosis.   Ask him to have repeated echocardiogram to make sure he does not progress to the level that something need to be done mechanically about the valve which I doubt but need to be checked and verified. Chronic study congestive heart effusion appears to be compensated I will check a Chem-7 as well as proBNP today.  I asked them to give her just 1 dose of 40 mg of p.o. Lasix tomorrow.  They said when she was taking 40 alternate with 60 mg every other day she felt the best ever.  I am not sure I understand why she was taken to the hospital with just 1 day of weight gain.  I instructed them to check weight every single day which they do.  I asked her not to act on any changes in the weight unless it happened more than 2 pounds within 2 consecutive days weight up. Essential hypertension blood pressure slightly on the lower side.  We will continue monitoring. Late effect of CVA.  Stable. Dyspnea on exertion: Multifactorial COPD diastolic congestive heart failure aortic stenosis play significant role here.  We will do echocardiogram today to determine the significance of aortic stenosis   Medication Adjustments/Labs and Tests Ordered: Current medicines are reviewed at length with the patient today.  Concerns regarding medicines are outlined above.  No orders of the defined types were placed in this encounter.  Medication changes: No orders of the defined types were placed in this encounter.   Signed, Park Liter, MD, [facility] 09/18/2021 11:14 AM    [place]

## 2021-09-19 ENCOUNTER — Encounter: Payer: Self-pay | Admitting: Cardiology

## 2021-09-19 LAB — BASIC METABOLIC PANEL
BUN/Creatinine Ratio: 12 (ref 12–28)
BUN: 20 mg/dL (ref 10–36)
CO2: 27 mmol/L (ref 20–29)
Calcium: 9.9 mg/dL (ref 8.7–10.3)
Chloride: 99 mmol/L (ref 96–106)
Creatinine, Ser: 1.66 mg/dL — ABNORMAL HIGH (ref 0.57–1.00)
Glucose: 102 mg/dL — ABNORMAL HIGH (ref 70–99)
Potassium: 4.5 mmol/L (ref 3.5–5.2)
Sodium: 139 mmol/L (ref 134–144)
eGFR: 29 mL/min/{1.73_m2} — ABNORMAL LOW (ref >59)

## 2021-09-19 NOTE — Telephone Encounter (Signed)
Please advise 

## 2021-09-20 ENCOUNTER — Ambulatory Visit (INDEPENDENT_AMBULATORY_CARE_PROVIDER_SITE_OTHER): Payer: Medicare HMO

## 2021-09-20 DIAGNOSIS — I350 Nonrheumatic aortic (valve) stenosis: Secondary | ICD-10-CM | POA: Diagnosis not present

## 2021-09-20 LAB — ECHOCARDIOGRAM COMPLETE
AR max vel: 0.72 cm2
AV Area VTI: 0.77 cm2
AV Area mean vel: 0.71 cm2
AV Mean grad: 24.3 mmHg
AV Peak grad: 41.8 mmHg
Ao pk vel: 3.23 m/s
Area-P 1/2: 2.62 cm2
MV VTI: 1.13 cm2
S' Lateral: 4 cm

## 2021-09-21 ENCOUNTER — Telehealth: Payer: Self-pay | Admitting: Cardiology

## 2021-09-21 NOTE — Telephone Encounter (Signed)
Daughter calling to F/U on test results. Please advise

## 2021-09-24 ENCOUNTER — Telehealth: Payer: Self-pay

## 2021-09-24 NOTE — Telephone Encounter (Signed)
-----   Message from Park Liter, MD sent at 09/20/2021  9:40 PM EDT ----- Kidney function improved, I would recommend to give 40 mg of Lasix every day and watch weight

## 2021-09-24 NOTE — Telephone Encounter (Signed)
Spoke with Fara Chute, notified of results and recommendation and agreed with plan. Patient has been on Lasix 40 and monitoring her weight.

## 2021-09-28 ENCOUNTER — Other Ambulatory Visit: Payer: Self-pay

## 2021-09-28 DIAGNOSIS — N289 Disorder of kidney and ureter, unspecified: Secondary | ICD-10-CM | POA: Diagnosis not present

## 2021-09-29 LAB — PRO B NATRIURETIC PEPTIDE: NT-Pro BNP: 674 pg/mL (ref 0–738)

## 2021-09-29 LAB — MAGNESIUM: Magnesium: 1.8 mg/dL (ref 1.6–2.3)

## 2021-10-01 DIAGNOSIS — I129 Hypertensive chronic kidney disease with stage 1 through stage 4 chronic kidney disease, or unspecified chronic kidney disease: Secondary | ICD-10-CM | POA: Diagnosis not present

## 2021-10-01 DIAGNOSIS — Z87891 Personal history of nicotine dependence: Secondary | ICD-10-CM | POA: Diagnosis not present

## 2021-10-01 DIAGNOSIS — I2510 Atherosclerotic heart disease of native coronary artery without angina pectoris: Secondary | ICD-10-CM | POA: Diagnosis not present

## 2021-10-01 DIAGNOSIS — Z789 Other specified health status: Secondary | ICD-10-CM | POA: Diagnosis not present

## 2021-10-01 DIAGNOSIS — N184 Chronic kidney disease, stage 4 (severe): Secondary | ICD-10-CM | POA: Diagnosis not present

## 2021-10-01 DIAGNOSIS — R061 Stridor: Secondary | ICD-10-CM | POA: Diagnosis not present

## 2021-10-01 DIAGNOSIS — I5030 Unspecified diastolic (congestive) heart failure: Secondary | ICD-10-CM | POA: Diagnosis not present

## 2021-10-01 DIAGNOSIS — H903 Sensorineural hearing loss, bilateral: Secondary | ICD-10-CM | POA: Diagnosis not present

## 2021-10-01 DIAGNOSIS — E039 Hypothyroidism, unspecified: Secondary | ICD-10-CM | POA: Diagnosis not present

## 2021-10-01 DIAGNOSIS — R0600 Dyspnea, unspecified: Secondary | ICD-10-CM | POA: Diagnosis not present

## 2021-10-01 DIAGNOSIS — H9190 Unspecified hearing loss, unspecified ear: Secondary | ICD-10-CM | POA: Diagnosis not present

## 2021-10-01 DIAGNOSIS — J449 Chronic obstructive pulmonary disease, unspecified: Secondary | ICD-10-CM | POA: Diagnosis not present

## 2021-10-01 DIAGNOSIS — I350 Nonrheumatic aortic (valve) stenosis: Secondary | ICD-10-CM | POA: Diagnosis not present

## 2021-10-02 ENCOUNTER — Telehealth: Payer: Self-pay

## 2021-10-02 NOTE — Telephone Encounter (Signed)
Results reviewed with pt's daughter Baker Janus- per DPR-as per Dr. Wendy Poet note.  Pt's daughter verbalized understanding and had no additional questions. Routed to PCP

## 2021-10-09 DIAGNOSIS — N184 Chronic kidney disease, stage 4 (severe): Secondary | ICD-10-CM | POA: Diagnosis not present

## 2021-10-09 DIAGNOSIS — N189 Chronic kidney disease, unspecified: Secondary | ICD-10-CM | POA: Diagnosis not present

## 2021-10-16 DIAGNOSIS — N3281 Overactive bladder: Secondary | ICD-10-CM | POA: Diagnosis not present

## 2021-10-16 DIAGNOSIS — N952 Postmenopausal atrophic vaginitis: Secondary | ICD-10-CM | POA: Diagnosis not present

## 2021-10-16 DIAGNOSIS — N390 Urinary tract infection, site not specified: Secondary | ICD-10-CM | POA: Diagnosis not present

## 2021-10-23 ENCOUNTER — Other Ambulatory Visit: Payer: Self-pay

## 2021-10-23 MED ORDER — METOPROLOL TARTRATE 25 MG PO TABS: 25.0000 mg | ORAL_TABLET | Freq: Two times a day (BID) | ORAL | 0 refills | Status: DC

## 2021-10-24 DIAGNOSIS — J9611 Chronic respiratory failure with hypoxia: Secondary | ICD-10-CM | POA: Diagnosis not present

## 2021-10-24 DIAGNOSIS — I129 Hypertensive chronic kidney disease with stage 1 through stage 4 chronic kidney disease, or unspecified chronic kidney disease: Secondary | ICD-10-CM | POA: Diagnosis not present

## 2021-10-24 DIAGNOSIS — I6990 Unspecified sequelae of unspecified cerebrovascular disease: Secondary | ICD-10-CM | POA: Diagnosis not present

## 2021-10-24 DIAGNOSIS — Z9981 Dependence on supplemental oxygen: Secondary | ICD-10-CM | POA: Diagnosis not present

## 2021-10-24 DIAGNOSIS — M48062 Spinal stenosis, lumbar region with neurogenic claudication: Secondary | ICD-10-CM | POA: Diagnosis not present

## 2021-10-24 DIAGNOSIS — N183 Chronic kidney disease, stage 3 unspecified: Secondary | ICD-10-CM | POA: Diagnosis not present

## 2021-10-24 DIAGNOSIS — Z9181 History of falling: Secondary | ICD-10-CM | POA: Diagnosis not present

## 2021-10-26 DIAGNOSIS — Z9181 History of falling: Secondary | ICD-10-CM

## 2021-10-26 DIAGNOSIS — Z9981 Dependence on supplemental oxygen: Secondary | ICD-10-CM

## 2021-10-26 HISTORY — DX: History of falling: Z91.81

## 2021-10-26 HISTORY — DX: Dependence on supplemental oxygen: Z99.81

## 2021-10-30 ENCOUNTER — Telehealth: Payer: Self-pay | Admitting: Internal Medicine

## 2021-10-30 NOTE — Telephone Encounter (Signed)
Called and spoke with patient's daughter Baker Janus. She stated that she has been dealing with the patient's PCP for the past 6 weeks to get her oxygen order sent to [facility]. She has [employer] and she is required to having everything switched over before October 1st.   Patient's appt with MW is not scheduled until 10/02. I was able to get her scheduled with TP tomorrow at 12pm for a walk test. She verbalized understanding.   Nothing further needed at time of call.

## 2021-10-30 NOTE — Telephone Encounter (Signed)
Pt is being switched to [facility] for DME. States PCP is unable to help with this.States she needs an evaluation of 02 concentration and liters of 02 she is on. Needs to know liter for battery powered machine. States this needs to be done before 10/1. Has upcoming OV with MW on 10/2. Can app do all these things for them?

## 2021-10-31 ENCOUNTER — Ambulatory Visit (INDEPENDENT_AMBULATORY_CARE_PROVIDER_SITE_OTHER): Payer: Medicare HMO

## 2021-10-31 ENCOUNTER — Encounter: Payer: Self-pay | Admitting: Adult Health

## 2021-10-31 ENCOUNTER — Ambulatory Visit: Payer: Medicare HMO | Admitting: Adult Health

## 2021-10-31 VITALS — BP 102/62 | HR 71 | Wt 145.2 lb

## 2021-10-31 DIAGNOSIS — J449 Chronic obstructive pulmonary disease, unspecified: Secondary | ICD-10-CM

## 2021-10-31 DIAGNOSIS — J9611 Chronic respiratory failure with hypoxia: Secondary | ICD-10-CM | POA: Diagnosis not present

## 2021-10-31 DIAGNOSIS — J90 Pleural effusion, not elsewhere classified: Secondary | ICD-10-CM | POA: Diagnosis not present

## 2021-10-31 NOTE — Progress Notes (Signed)
$'@Patient'B$  ID: Alanson Puls, female    DOB: 01/20/1928, 88 y.o.   MRN: [id number]  Chief Complaint  Patient presents with   Follow-up    Referring provider: Angelina Sheriff, MD  HPI: 88 year old female followed for COPD and oxygen dependent respiratory failure Medical history significant for aortic valve stenosis, diastolic heart failure   TEST/EVENTS :  Spirometry 07/13/2021   FEV1 0.8 (76%)  Ratio 0.62   PFT 02/17/17    FEV1 1.35 [118%], ratio 0.82 with 14% resp to saba and TLC 93%, DLCO 73% - Echo 01/20/2018 c/w mod AS s PH   Echo  04/11/21   1. Left ventricular ejection fraction, by estimation, is 60 to 65%. The  left ventricle has normal function. The left ventricle has no regional  wall motion abnormalities. Left ventricular diastolic parameters are  consistent with Grade II  diastolic dysfunction   2. Right ventricular systolic function is normal. The right ventricular  size is normal. There is mildly elevated pulmonary artery systolic   3/41/9622 Follow up : COPD, O2 RF , D CHF Pleural Effusions  Patient returns for 6-monthfollow-up.  She has underlying COPD.  Is on Pulmicort nebs twice daily and duoneb Four times a day .  Said overall she is doing okay.  No flare of cough or wheezing. She remains on oxygen 2 L.  Patient currently has heavy oxygen tanks.  Wants to get a POC.  Today in the office O2 saturations 87% on room air.  Required 3 L of pulsed oxygen on POC device to maintain O2 saturation greater than 88 to 90%.  She is followed by cardiology for diastolic heart failure and aortic valve stenosis.  Recent echo showed stable EF, moderate aortic valve stenosis.  Recommended for medical management.  She remains on Lasix 40 mg daily.  She can take an extra Lasix 40 mg if has weight gain greater than 3 pounds.  Patient does her daily weights.  Says overall fluid level has been stable.   Lives at home , Family comes by often . Drives . [place] rolling [place] , need POC to help get around. Tanks are too heavy. Uses a rolling walker, hard to keep up with tanks   Allergies  Allergen Reactions   Codeine Hives and Nausea Only    Immunization History  Administered Date(s) Administered   Influenza Split 12/09/2016   Influenza-Unspecified 01/04/2013, 10/05/2013, 11/04/2017    Past Medical History:  Diagnosis Date   Aortic stenosis 11/09/2014   Overview:  Mild in 2015 Moderate to severe in the October 2018   Chronic respiratory failure with hypoxia (HCC) 08/06/2018   02 hs x 2lpm as of 08/05/2018 and prn daytime to keep sats > 90%  - 09/01/2018 no desats walking / limited by back    COPD (chronic obstructive pulmonary disease) (HCC)    Coronary artery disease    [place]    Di[place] 07/31/2018   DOE (dyspnea on exertion) 12/11/2016   Quit smoking 07/22/18 with emphysema on ct from 06/12/2018 - PFT 02/17/17    FEV1 1.35 [118%], ratio 0.82 with 14% resp to saba and TLC 93%, DLCO 73% - Echo 01/20/2018 c/w mod AS s PH - pt stopped vasotec and anoro on her own prior to pulmonary ov 08/05/2018 > rec leave off and rx for gerd x 4 weeks > improved as of 09/01/2018  - 09/01/2018   Walked RA  x one lap =  approx 250 ft avg pace >> stopped due to   Essential hypertension 08/18/2013   rec Off acei permanently as of 08/05/2018 due to pseudowheeze    Greater trochanteric bursitis of right hip 11/13/2017   Hyperlipemia 08/18/2013   Hyperlipidemia    Hypertension    Late effects of CVA (cerebrovascular accident) 11/09/2014   Lumbar degenerative disc disease 06/18/2013   Lumbar spondylosis 06/18/2013   Peripheral vascular disease, unspecified ([place]) status post right carotic endarterectomy 08/28/2018   Sacroiliac inflammation ([place]) 11/13/2017   Sacroiliitis ([place]) 11/13/2017   Smoking 05/23/2015   Spinal stenosis of lumbar region with neurogenic claudication 08/18/2013   Stroke ([place]) 09/08/2013   Thoracic aortic  atherosclerosis ([place])     Tobacco History: Social History   Tobacco Use  Smoking Status Former   Packs/day: 1.00   Years: 35.00   Total pack years: 35.00   Types: Cigarettes   Quit date: 2023   Years since quitting: 0.7  Smokeless Tobacco Never   Counseling given: Not Answered   Outpatient Medications Prior to Visit  Medication Sig Dispense Refill   albuterol (VENTOLIN HFA) 108 (90 Base) MCG/ACT inhaler Inhale 1 puff into the lungs as needed for wheezing or shortness of breath. (Patient taking differently: Inhale 1 puff into the lungs as needed for wheezing or shortness of breath. Ventolin brand only) 3 each 3   ALPRAZolam (XANAX) 0.25 MG tablet Take 0.25 mg by mouth daily as needed for anxiety or sleep.     amLODipine (NORVASC) 5 MG tablet Take 5 mg by mouth daily.     atorvastatin (LIPITOR) 40 MG tablet Take 1 tablet (40 mg total) by mouth daily. 90 tablet 2   budesonide (PULMICORT) 0.25 MG/2ML nebulizer solution Take 2 mLs (0.25 mg total) by nebulization 2 (two) times daily. 360 mL 3   clopidogrel (PLAVIX) 75 MG tablet Take 75 mg by mouth daily.      Cyanocobalamin (VITAMIN B12 PO) Take 1 tablet by mouth daily. Unknown strenght     famotidine (PEPCID) 20 MG tablet One after supper (Patient taking differently: Take 20 mg by mouth daily. One after supper) 30 tablet 11   furosemide (LASIX) 40 MG tablet Take 1 tablet (40 mg total) by mouth daily. Take an extra '40mg'$  with weight gain >3lbs overnight or 5lbs in 1 week. 90 tablet 3   ipratropium-albuterol (DUONEB) 0.5-2.5 (3) MG/3ML SOLN Take 3 mLs by nebulization every 4 (four) hours as needed (Wheezing  and SOB). 360 mL 3   ketoconazole (NIZORAL) 2 % cream Apply between toes 4 and 5 of right foot for 3 weeks for burning pain-  twice daily (Patient taking differently: Apply 1 Application topically as needed for irritation. Apply between toes 4 and 5 of right foot for 3 weeks for burning pain-  twice daily) 60 g 2   Lactobacillus  Rhamnosus, GG, (CULTURELLE) CAPS Take 1 capsule by mouth daily.     levothyroxine (SYNTHROID, LEVOTHROID) 50 MCG tablet Take 1 tablet daily by mouth.     lidocaine (LIDODERM) 5 % Place 1 patch onto the skin as needed for pain.     magnesium oxide (MAG-OX) 400 (240 Mg) MG tablet Take 1 tablet by mouth 2 (two) times daily.     metoprolol tartrate (LOPRESSOR) 25 MG tablet Take 1 tablet (25 mg total) by mouth 2 (two) times daily. 180 tablet 0   OXYGEN Inhale 2 L into the lungs as needed (Mainly at bedtime). 2lpm as  needed     pantoprazole (PROTONIX) 40 MG tablet Take 40 mg by mouth daily.     potassium chloride (MICRO-K) 10 MEQ CR capsule Take 1 capsule (10 mEq total) by mouth 2 (two) times daily. (Patient taking differently: Take 10 mEq by mouth daily.) 180 capsule 2   traZODone (DESYREL) 50 MG tablet Take 50 mg by mouth as needed for sleep.     trimethoprim (TRIMPEX) 100 MG tablet Take 50 mg by mouth daily.     Vitamin D, Ergocalciferol, (DRISDOL) 1.25 MG (50000 UT) CAPS capsule Take 50,000 Units by mouth 2 (two) times a week. (Patient not taking: Reported on 10/31/2021)     No facility-administered medications prior to visit.     Review of Systems:   Constitutional:   No  weight loss, night sweats,  Fevers, chills,  +fatigue, or  lassitude.  HEENT:   No headaches,  Difficulty swallowing,  Tooth/dental problems, or  Sore throat,                No sneezing, itching, ear ache, nasal congestion, post nasal drip,   CV:  No chest pain,  Orthopnea, PND, +swelling in lower extremities, anasarca, dizziness, palpitations, syncope.   GI  No heartburn, indigestion, abdominal pain, nausea, vomiting, diarrhea, change in bowel habits, loss of appetite, bloody stools.   Resp: .  No chest wall deformity  Skin: no rash or lesions.  GU: no dysuria, change in color of urine, no urgency or frequency.  No flank pain, no hematuria   MS:  No joint pain or swelling.  No decreased range of motion.  No back  pain.    Physical Exam  BP 102/62   Pulse 71   Wt 145 lb 3.2 oz (65.9 kg)   SpO2 97%   BMI 29.33 kg/m   GEN: A/Ox3; pleasant , NAD, well nourished , elderly on O2 , rolling walker    HEENT:  [place]/AT,  NOSE-clear, THROAT-clear, no lesions, no postnasal drip or exudate noted.   NECK:  Supple w/ fair ROM; no JVD; normal carotid impulses w/o bruits; no thyromegaly or nodules palpated; no lymphadenopathy.    RESP  Clear  P & A; w/o, wheezes/ rales/ or rhonchi. no accessory muscle use, no dullness to percussion  CARD:  RRR, no m/r/g, no peripheral edema, pulses intact, no cyanosis or clubbing.  GI:   Soft & nt; nml bowel sounds; no organomegaly or masses detected.   Musco: Warm bil, no deformities or joint swelling noted.   Neuro: alert, no focal deficits noted.    Skin: Warm, no lesions or rashes    Lab Results:  CBC No results found for: "WBC", "RBC", "HGB", "HCT", "PLT", "MCV", "MCH", "MCHC", "RDW", "LYMPHSABS", "MONOABS", "EOSABS", "BASOSABS"      Imaging: No results found.       Latest Ref Rng & Units 02/17/2017   12:50 PM  PFT Results  FVC-Pre L 1.50   FVC-Predicted Pre % 94   FVC-Post L 1.64   FVC-Predicted Post % 103   Pre FEV1/FVC % % 79   Post FEV1/FCV % % 82   FEV1-Pre L 1.18   FEV1-Predicted Pre % 103   FEV1-Post L 1.35   DLCO uncorrected ml/min/mmHg 11.64   DLCO UNC% % 71   DLCO corrected ml/min/mmHg 11.94   DLCO COR %Predicted % 73   DLVA Predicted % 88   TLC L 3.90   TLC % Predicted % 93   RV % Predicted %  94     No results found for: "NITRICOXIDE"      Assessment & Plan:   No problem-specific Assessment & Plan notes found for this encounter.     Rexene Edison, NP 10/31/2021

## 2021-10-31 NOTE — Patient Instructions (Addendum)
Continue on Pulmicort Neb Twice daily  Continue on Duoneb Four times a day  Continue on Oxygen 2l/m . (POC use 3l/m )  Order for POC device .  Chest xray today  Activity as tolerated.  Follow up with Dr. Melvyn Novas in 3 months and As needed

## 2021-11-01 ENCOUNTER — Encounter: Payer: Self-pay | Admitting: Internal Medicine

## 2021-11-01 ENCOUNTER — Telehealth: Payer: Self-pay | Admitting: Adult Health

## 2021-11-01 DIAGNOSIS — J9611 Chronic respiratory failure with hypoxia: Secondary | ICD-10-CM

## 2021-11-02 NOTE — Telephone Encounter (Signed)
Melissa states order needs frequency. Needs clarification on oxygen order. Melissa phone number is [phone].

## 2021-11-02 NOTE — Telephone Encounter (Signed)
Spoke with Melissa  Needing clarification on o2 order   Tammy- how often is she supposed to use o2? Just with exertion?   Please advise and we will correct order, thanks!

## 2021-11-02 NOTE — Telephone Encounter (Signed)
24/7  

## 2021-11-02 NOTE — Telephone Encounter (Signed)
Mychart message sent by pt stating that the order for pt's O2 needs to be sent to [facility], not [facility]. Routng to PCCs.

## 2021-11-02 NOTE — Telephone Encounter (Signed)
I have sent order to [facility].  Will route back to triage to make pt aware thru MyChart & close out message.

## 2021-11-02 NOTE — Assessment & Plan Note (Signed)
Continue on oxygen to maintain O2 saturations greater than 88 to 90%.  Needs a POC to help with independence.  Plan  Patient Instructions  Continue on Pulmicort Neb Twice daily  Continue on Duoneb Four times a day  Continue on Oxygen 2l/m . (POC use 3l/m )  Order for POC device .  Chest xray today  Activity as tolerated.  Follow up with Dr. Melvyn Novas in 3 months and As needed      '

## 2021-11-02 NOTE — Assessment & Plan Note (Signed)
Compensated on present regimen   Plan  Patient Instructions  Continue on Pulmicort Neb Twice daily  Continue on Duoneb Four times a day  Continue on Oxygen 2l/m . (POC use 3l/m )  Order for POC device .  Chest xray today  Activity as tolerated.  Follow up with Dr. Melvyn Novas in 3 months and As needed

## 2021-11-05 ENCOUNTER — Ambulatory Visit: Payer: Medicare HMO | Admitting: Internal Medicine

## 2021-11-05 NOTE — Telephone Encounter (Signed)
New order has been sent to [facility].

## 2021-11-19 ENCOUNTER — Ambulatory Visit: Payer: Medicare HMO | Attending: Cardiology | Admitting: Cardiology

## 2021-11-19 VITALS — BP 118/62 | HR 75 | Ht 59.0 in | Wt 149.0 lb

## 2021-11-19 DIAGNOSIS — I739 Peripheral vascular disease, unspecified: Secondary | ICD-10-CM

## 2021-11-19 DIAGNOSIS — I350 Nonrheumatic aortic (valve) stenosis: Secondary | ICD-10-CM | POA: Diagnosis not present

## 2021-11-19 DIAGNOSIS — I10 Essential (primary) hypertension: Secondary | ICD-10-CM | POA: Diagnosis not present

## 2021-11-19 DIAGNOSIS — I6990 Unspecified sequelae of unspecified cerebrovascular disease: Secondary | ICD-10-CM

## 2021-11-19 DIAGNOSIS — R0609 Other forms of dyspnea: Secondary | ICD-10-CM | POA: Diagnosis not present

## 2021-11-19 MED ORDER — AMLODIPINE BESYLATE 2.5 MG PO TABS: 2.5000 mg | ORAL_TABLET | Freq: Every day | ORAL | 3 refills | Status: DC

## 2021-11-19 NOTE — Patient Instructions (Signed)
Medication Instructions:  Your physician has recommended you make the following change in your medication:   Decrease: Amlodipine 2.'5mg'$  1 tablet by mouth daily - you can take 1/2 tablet of your current dose and then your next refill will be 2.'5mg'$  tablets   Lab Work: Lipid, BMP, - today If you have labs (blood work) drawn today and your tests are completely normal, you will receive your results only by: [place] (if you have MyChart) OR A paper copy in the mail If you have any lab test that is abnormal or we need to change your treatment, we will call you to review the results.   Testing/Procedures: None Ordered   Follow-Up: At [facility], you and your health needs are our priority.  As part of our continuing mission to provide you with exceptional heart care, we have created designated Provider Care Teams.  These Care Teams include your primary Cardiologist (physician) and Advanced Practice Providers (APPs -  Physician Assistants and Nurse Practitioners) who all work together to provide you with the care you need, when you need it.  We recommend signing up for the patient portal called "MyChart".  Sign up information is provided on this After Visit Summary.  MyChart is used to connect with patients for Virtual Visits (Telemedicine).  Patients are able to view lab/test results, encounter notes, upcoming appointments, etc.  Non-urgent messages can be sent to your provider as well.   To learn more about what you can do with MyChart, go to [email or web address].    Your next appointment:   5 month(s)  The format for your next appointment:   In Person  Provider:   Jenne Campus, MD    Other Instructions NA

## 2021-11-19 NOTE — Progress Notes (Unsigned)
Cardiology Office Note:    Date:  11/19/2021   ID:  Annette Huang, DOB Apr 17, 1927, MRN [id number]  PCP:  Maggie Schwalbe, PA-C  Cardiologist:  Jenne Campus, MD    Referring MD: Angelina Sheriff, MD   Chief Complaint  Patient presents with   Follow-up    History of Present Illness:    Annette Huang is a 88 y.o. female with past medical history significant for aortic stenosis which is moderate, congestive heart failure, COPD, essential hypertension, late effect of CVA, dyspnea on exertion.  She comes today 2 months for follow-up.  Overall she is doing fine.  Does use oxygen all the time gets short of breath easily while walking but no worsening of the symptoms.  She denies have any chest pain tightness squeezing pressure burning chest, no palpitations dizziness does have some swelling of lower extremities which is worse at evening time  Past Medical History:  Diagnosis Date   Aortic stenosis 11/09/2014   Overview:  Mild in 2015 Moderate to severe in the October 2018   Chronic respiratory failure with hypoxia (HCC) 08/06/2018   02 hs x 2lpm as of 08/05/2018 and prn daytime to keep sats > 90%  - 09/01/2018 no desats walking / limited by back    COPD (chronic obstructive pulmonary disease) (HCC)    Coronary artery disease    CVA (cerebral vascular accident) ([place])    Diastolic congestive heart failure ([place]) 07/31/2018   DOE (dyspnea on exertion) 12/11/2016   Quit smoking 07/22/18 with emphysema on ct from 06/12/2018 - PFT 02/17/17    FEV1 1.35 [118%], ratio 0.82 with 14% resp to saba and TLC 93%, DLCO 73% - Echo 01/20/2018 c/w mod AS s PH - pt stopped vasotec and anoro on her own prior to pulmonary ov 08/05/2018 > rec leave off and rx for gerd x 4 weeks > improved as of 09/01/2018  - 09/01/2018   Walked RA x one lap =  approx 250 ft avg pace >> stopped due to   Essential hypertension 08/18/2013   rec Off acei permanently as of 08/05/2018 due to pseudowheeze    Greater trochanteric bursitis  of right hip 11/13/2017   Hyperlipemia 08/18/2013   Hyperlipidemia    Hypertension    Late effects of CVA (cerebrovascular accident) 11/09/2014   Lumbar degenerative disc disease 06/18/2013   Lumbar spondylosis 06/18/2013   Peripheral vascular disease, unspecified ([place]) status post right carotic endarterectomy 08/28/2018   Sacroiliac inflammation ([place]) 11/13/2017   Sacroiliitis ([place]) 11/13/2017   Smoking 05/23/2015   Spinal stenosis of lumbar region with neurogenic claudication 08/18/2013   Stroke ([place]) 09/08/2013   Thoracic aortic atherosclerosis (HCC)     Past Surgical History:  Procedure Laterality Date   CAROTID ENDARTERECTOMY Bilateral    NECK SURGERY      Current Medications: Current Meds  Medication Sig   albuterol (VENTOLIN HFA) 108 (90 Base) MCG/ACT inhaler Inhale 1 puff into the lungs as needed for wheezing or shortness of breath. (Patient taking differently: Inhale 1 puff into the lungs as needed for wheezing or shortness of breath. Ventolin brand only)   ALPRAZolam (XANAX) 0.25 MG tablet Take 0.25 mg by mouth daily as needed for anxiety or sleep.   amLODipine (NORVASC) 5 MG tablet Take 5 mg by mouth daily.   atorvastatin (LIPITOR) 40 MG tablet Take 1 tablet (40 mg total) by mouth daily.   budesonide (PULMICORT) 0.25 MG/2ML nebulizer solution Take 2 mLs (0.25 mg total)  by nebulization 2 (two) times daily.   clopidogrel (PLAVIX) 75 MG tablet Take 75 mg by mouth daily.    Cyanocobalamin (VITAMIN B12 PO) Take 1 tablet by mouth daily. Unknown strenght   famotidine (PEPCID) 20 MG tablet One after supper (Patient taking differently: Take 20 mg by mouth daily. One after supper)   furosemide (LASIX) 40 MG tablet Take 1 tablet (40 mg total) by mouth daily. Take an extra '40mg'$  with weight gain >3lbs overnight or 5lbs in 1 week.   ipratropium-albuterol (DUONEB) 0.5-2.5 (3) MG/3ML SOLN Take 3 mLs by nebulization every 4 (four) hours as needed (Wheezing  and SOB).   ketoconazole (NIZORAL) 2 %  cream Apply between toes 4 and 5 of right foot for 3 weeks for burning pain-  twice daily (Patient taking differently: Apply 1 Application topically as needed for irritation. Apply between toes 4 and 5 of right foot for 3 weeks for burning pain-  twice daily)   Lactobacillus Rhamnosus, GG, (CULTURELLE) CAPS Take 1 capsule by mouth daily.   levothyroxine (SYNTHROID, LEVOTHROID) 50 MCG tablet Take 1 tablet daily by mouth.   lidocaine (LIDODERM) 5 % Place 1 patch onto the skin as needed for pain.   magnesium oxide (MAG-OX) 400 (240 Mg) MG tablet Take 1 tablet by mouth 2 (two) times daily.   metoprolol tartrate (LOPRESSOR) 25 MG tablet Take 1 tablet (25 mg total) by mouth 2 (two) times daily.   OXYGEN Inhale 2 L into the lungs as needed (Mainly at bedtime). 2lpm as needed   pantoprazole (PROTONIX) 40 MG tablet Take 40 mg by mouth daily.   potassium chloride (MICRO-K) 10 MEQ CR capsule Take 1 capsule (10 mEq total) by mouth 2 (two) times daily. (Patient taking differently: Take 10 mEq by mouth daily.)   traZODone (DESYREL) 50 MG tablet Take 50 mg by mouth as needed for sleep.   Vitamin D, Ergocalciferol, (DRISDOL) 1.25 MG (50000 UT) CAPS capsule Take 50,000 Units by mouth 2 (two) times a week.     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  Tobacco Use   Smoking status: Former    Packs/day: 1.00    Years: 35.00    Total pack years: 35.00    Types: Cigarettes    Quit date: 2023    Years since quitting: 0.7   Smokeless tobacco: Never  Vaping Use   Vaping Use: Former  Substance and Sexual Activity   Alcohol use: No   Drug use: No   Sexual activity: Not on file  Other Topics Concern   Not on file  Social History Narrative   Not on file    Social Determinants of Health   Financial Resource Strain: Not on file  Food Insecurity: Not on file  Transportation Needs: Not on file  Physical Activity: Not on file  Stress: Not on file  Social Connections: Not on file     Family History: The patient's family history includes Heart disease in her father and mother; Hypertension in her mother. ROS:   Please see the history of present illness.    All 14 point review of systems negative except as described per history of present illness  EKGs/Labs/Other  Studies Reviewed:      Recent Labs: 09/18/2021: BUN 20; Creatinine, Ser 1.66; Potassium 4.5; Sodium 139 09/28/2021: Magnesium 1.8; NT-Pro BNP 674  Recent Lipid Panel    Component Value Date/Time   CHOL 132 09/14/2019 1006   TRIG 59 09/14/2019 1006   HDL 54 09/14/2019 1006   CHOLHDL 2.4 09/14/2019 1006   LDLCALC 65 09/14/2019 1006    Physical Exam:    VS:  BP 118/62   Pulse 75   Ht '4\' 11"'$  (1.499 m)   Wt 149 lb (67.6 kg)   SpO2 97% Comment: with O2 on  BMI 30.09 kg/m     Wt Readings from Last 3 Encounters:  11/19/21 149 lb (67.6 kg)  10/31/21 145 lb 3.2 oz (65.9 kg)  09/18/21 145 lb 3.2 oz (65.9 kg)     GEN:  Well nourished, well developed in no acute distress HEENT: Normal NECK: No JVD; No carotid bruits LYMPHATICS: No lymphadenopathy CARDIAC: RRR, systolic ejection murmur grade 2/6 to 3/6 best heard right upper portion of the sternum, S2 is soft, no rubs, no gallops RESPIRATORY:  Clear to auscultation without rales, wheezing or rhonchi  ABDOMEN: Soft, non-tender, non-distended MUSCULOSKELETAL:  No edema; No deformity  SKIN: Warm and dry LOWER EXTREMITIES: no swelling NEUROLOGIC:  Alert and oriented x 3 PSYCHIATRIC:  Normal affect   ASSESSMENT:    1. Nonrheumatic aortic valve stenosis   2. Essential hypertension   3. Peripheral vascular disease, unspecified ([place]) status post right carotic endarterectomy   4. Late effects of CVA (cerebrovascular  accident)   5. DOE (dyspnea on exertion)    PLAN:    In order of problems listed above:  Nonrheumatic aortic valve stenosis, she did have echocardiogram done in the office which showed moderate to severe aortic stenosis but not to the point that in for information is needed.  We will continue monitoring it. Essential hypertension blood pressure seems to be a little on the lower side, I will ask her to cut down amlodipine to only 2.5 mg daily .  Vascular disease stable. Late effect of CVA, present.  No new symptoms Dyslipidemia I did review her K PN which show me her LDL of 65 HDL 61 however this data is from last year.  We will make arrangements for fasting lipid profile to be done.   Medication Adjustments/Labs and Tests Ordered: Current medicines are reviewed at length with the patient today.  Concerns regarding medicines are outlined above.  No orders of the defined types were placed in this encounter.  Medication changes: No orders of the defined types were placed in this encounter.   Signed, Park Liter, MD, [facility] 11/19/2021 10:58 AM    [place]

## 2021-11-20 LAB — LIPID PANEL
Chol/HDL Ratio: 2.7 ratio (ref 0.0–4.4)
Cholesterol, Total: 162 mg/dL (ref 100–199)
HDL: 61 mg/dL (ref >39)
LDL Chol Calc (NIH): 84 mg/dL (ref 0–99)
Triglycerides: 90 mg/dL (ref 0–149)
VLDL Cholesterol Cal: 17 mg/dL (ref 5–40)

## 2021-11-20 LAB — BASIC METABOLIC PANEL
BUN/Creatinine Ratio: 14 (ref 12–28)
BUN: 22 mg/dL (ref 10–36)
CO2: 28 mmol/L (ref 20–29)
Calcium: 10.2 mg/dL (ref 8.7–10.3)
Chloride: 103 mmol/L (ref 96–106)
Creatinine, Ser: 1.53 mg/dL — ABNORMAL HIGH (ref 0.57–1.00)
Glucose: 108 mg/dL — ABNORMAL HIGH (ref 70–99)
Potassium: 4.2 mmol/L (ref 3.5–5.2)
Sodium: 145 mmol/L — ABNORMAL HIGH (ref 134–144)
eGFR: 32 mL/min/{1.73_m2} — ABNORMAL LOW (ref >59)

## 2021-11-26 ENCOUNTER — Telehealth: Payer: Self-pay

## 2021-11-26 NOTE — Telephone Encounter (Signed)
-----   Message from Park Liter, MD sent at 11/22/2021  8:12 PM EDT ----- Labs are looking good, continue present management

## 2021-11-26 NOTE — Telephone Encounter (Signed)
Patient notified via mychart

## 2021-11-27 ENCOUNTER — Ambulatory Visit: Payer: Medicare HMO | Admitting: Cardiology

## 2021-12-20 DIAGNOSIS — R4182 Altered mental status, unspecified: Secondary | ICD-10-CM

## 2021-12-20 HISTORY — DX: Altered mental status, unspecified: R41.82

## 2022-01-15 ENCOUNTER — Ambulatory Visit: Payer: Medicare HMO | Admitting: Internal Medicine

## 2022-01-15 DIAGNOSIS — K219 Gastro-esophageal reflux disease without esophagitis: Secondary | ICD-10-CM

## 2022-01-15 HISTORY — DX: Gastro-esophageal reflux disease without esophagitis: K21.9

## 2022-01-15 NOTE — Progress Notes (Deleted)
Annette Huang, female    DOB: 02/18/1927,   MRN: [id number]   Brief patient profile:  33 yowf  Quit smoking  06/2021  referred to pulmonary clinic 08/05/2018 by Dr  Lin Landsman for eval of cough/ sob ? All copd related though note last pfts on record >>>02/17/17    FEV1 1.35 [118%], ratio 0.82 with 14% resp to saba and TLC 93%, DLCO 73%     History of Present Illness  08/05/2018  Pulmonary/ 1st office eval/Lateka Rady  - stopped vasotec/anoro prior to ov  Chief Complaint  Patient presents with   Pulmonary Consult    Referred by Dr Lovette Cliche for eval of COPD. Pt c/o increased cough and SOB for the past few wks.   Dyspnea:  Can lean on buggy [employer] = MMRC2 = can't walk a nl pace on a flat grade s sob but does fine slow and flat  Cough: some throat congestion  Sleep: wedge x 30 degrees  And 2lpm at hs  SABA use: duoneb 2-3 per day / was only taking prn anoro prior to stopping it completely Cough some better with prednisone but never compltely free of sense of throat congestion Has 02 not using x at hs 2lpm rec Continue off anoro and vasotec as you are  Pantoprazole (protonix) 40 mg   Take  30-60 min before first meal of the day and Pepcid (famotidine)  20 mg one  After supper  until return to office - this is the best way to tell whether stomach acid is contributing to your problem.  GERD diet  Only use your albuterol-ipatropium  as a rescue medication   Wear 2lpm at bedtime and during the day as needed to keep your saturation above 90% and adjust the flow to keep it over 90% at all times to help your heart deliver 0xygen without having to strain.    09/01/2018  f/u ov/Shawndra Clute re: doe/ min copd clinically  Chief Complaint  Patient presents with   Follow-up    Breathing is overall doing well and she has not used her neb or o2 recently.   Dyspnea:  Better but not very active Cough: none / throat congetstion is better  Sleeping: able to lie flat one pillow SABA use: none  02: just sometimes  at hs / not checking sats with activity  rec Wear 02 at bedtime  X 2lpm and as needed during the day for short of breath or 02 level less than 90% Nebulizer is only as needed    06/25/2021  f/u ov/Kaitlyn Skowron re: doe / min copd   maint on nothing has neb prn and started April 20th  p ? URI > changed neb to qid  - now on  fosamax with prominent pseudowheeze  Chief Complaint  Patient presents with   Follow-up    DOE  Dyspnea:  not back to baseline  Cough: not much  Sleeping: wedges doing fine  SABA use:qid neb 02: 2lpm bedtime and 2lpm prn daytime Covid status:   JJ Rec Stop fosamax  the alternatives are Prolia injection (shots twice a year or relclast infusion (once a year)  Pantoprazole (protonix) 40 mg   Take  30-60 min before first meal of the day and Pepcid (famotidine)  20 mg after supper until return to office - this is the best way to tell whether stomach acid is contributing to your problem.   GERD rx Make sure you check your oxygen saturation  AT  your highest level of activity (not after you stop)     07/08/21  ER eval for SOB/ leg swelling     07/13/2021  f/u ov/Briony Parveen/ [facility] re: AS/copd/ hypoxemic /hypercarbic resp failure maint on qid duoneb/ budesonide bid   Chief Complaint  Patient presents with   Follow-up    SOB and small amount of cough   Dyspnea:  100 ft / hc parking  Cough: none  Sleeping: able to lie down flatter  SABA use: qid duoneb plus bid pulmocort 02: 2 lpm and prn daytime  Swallowing ok/ cc "indigestion" chronically  Has AS but refused to consider TAVR per daughter Rec Stay on the same nebulizer for  now  Make sure you check your oxygen saturation  AT  your highest level of activity (not after you stop)   to be sure it stays over 90%   I will be referring to [facility] for the throat wheezing you are experiencing    07/30/2021  f/u ov/Yemaya Barnier re: AS/COPD/hypoxemic/hypercarbic ? VCD    maint on 02   Chief Complaint  Patient presents with    Follow-up  Dyspnea:  sitting at rest sometimes sob now Cough: minimal  Sleeping: wedges under mattress gets about 45% but wakes up sob at nl hour, no pnd SABA use: no extra  02: 2lpm  Rec Adjust the 02 flow for saturations between 90-95% GERD diet reviewed, bed blocks rec  Please schedule a follow up visit in 3 months but call sooner if needed  with all medications /inhalers/ solutions in hand     01/15/2022  f/u ov/Ebrahim Deremer re: ***   maint on ***  No chief complaint on file.   Dyspnea:  *** Cough: *** Sleeping: *** SABA use: *** 02: *** Covid status:   *** Lung cancer screening :  ***    No obvious day to day or daytime variability or assoc excess/ purulent sputum or mucus plugs or hemoptysis or cp or chest tightness, subjective wheeze or overt sinus or hb symptoms.   *** without nocturnal  or early am exacerbation  of respiratory  c/o's or need for noct saba. Also denies any obvious fluctuation of symptoms with weather or environmental changes or other aggravating or alleviating factors except as outlined above   No unusual exposure hx or h/o childhood pna/ asthma or knowledge of premature birth.  Current Allergies, Complete Past Medical History, Past Surgical History, Family History, and Social History were reviewed in [employer] record.  ROS  The following are not active complaints unless bolded Hoarseness, sore throat, dysphagia, dental problems, itching, sneezing,  nasal congestion or discharge of excess mucus or purulent secretions, ear ache,   fever, chills, sweats, unintended wt loss or wt gain, classically pleuritic or exertional cp,  orthopnea pnd or arm/hand swelling  or leg swelling, presyncope, palpitations, abdominal pain, anorexia, nausea, vomiting, diarrhea  or change in bowel habits or change in bladder habits, change in stools or change in urine, dysuria, hematuria,  rash, arthralgias, visual complaints, headache, numbness, weakness or ataxia  or problems with walking or coordination,  change in mood or  memory.        No outpatient medications have been marked as taking for the 01/15/22 encounter (Appointment) with Tanda Rockers, MD.                   Past Medical History:  Diagnosis Date   COPD (chronic obstructive pulmonary disease) ([place])  Coronary artery disease    CVA (cerebral vascular accident) ([place])    Hyperlipidemia    Hypertension    Thoracic aortic atherosclerosis ([place])       Objective:    Wts  01/15/2022      ***  07/30/2021       151  07/13/2021         145    06/25/21 144 lb 9.6 oz (65.6 kg)  04/18/21 142 lb 6.4 oz (64.6 kg)  10/13/20 142 lb 9.6 oz (64.7 kg)    Vital signs reviewed  01/15/2022  - Note at rest 02 sats  ***% on ***   General appearance:    ***      Min barrel/kyphotic   1+ Pitting slt both LE worse on L ***     CXR PA and Lateral:   07/30/2021 :    I personally reviewed images and impression is as follows:     Worse L effusion is the only new finding         Assessment

## 2022-01-21 ENCOUNTER — Other Ambulatory Visit: Payer: Self-pay | Admitting: Cardiology

## 2022-01-21 NOTE — Telephone Encounter (Signed)
Rx refill sent to pharmacy. 

## 2022-01-29 ENCOUNTER — Other Ambulatory Visit: Payer: Self-pay | Admitting: Internal Medicine

## 2022-01-29 DIAGNOSIS — J22 Unspecified acute lower respiratory infection: Secondary | ICD-10-CM

## 2022-01-29 HISTORY — DX: Unspecified acute lower respiratory infection: J22

## 2022-01-31 ENCOUNTER — Other Ambulatory Visit: Payer: Self-pay | Admitting: Cardiology

## 2022-02-01 NOTE — Telephone Encounter (Signed)
Rx refill sent to pharmacy. 

## 2022-02-15 ENCOUNTER — Ambulatory Visit: Payer: Medicare HMO | Admitting: Internal Medicine

## 2022-02-25 ENCOUNTER — Ambulatory Visit: Payer: Medicare HMO | Admitting: Internal Medicine

## 2022-03-10 NOTE — Progress Notes (Unsigned)
Annette Huang, female    DOB: 1927/09/10,   MRN: [id number]   Brief patient profile:  75 yowf  Quit smoking  06/2021  referred to pulmonary clinic 08/05/2018 by Dr  Lin Landsman for eval of cough/ sob ? All copd related though note last pfts on record >>>02/17/17    FEV1 1.35 [118%], ratio 0.82 with 14% resp to saba and TLC 93%, DLCO 73%     History of Present Illness  08/05/2018  Pulmonary/ 1st office eval/Sahan Pen  - stopped vasotec/anoro prior to ov  Chief Complaint  Patient presents with   Pulmonary Consult    Referred by Dr Lovette Cliche for eval of COPD. Pt c/o increased cough and SOB for the past few wks.   Dyspnea:  Can lean on buggy food lion = MMRC2 = can't walk a nl pace on a flat grade s sob but does fine slow and flat  Cough: some throat congestion  Sleep: wedge x 30 degrees  And 2lpm at hs  SABA use: duoneb 2-3 per day / was only taking prn anoro prior to stopping it completely Cough some better with prednisone but never compltely free of sense of throat congestion Has 02 not using x at hs 2lpm rec Continue off anoro and vasotec as you are  Pantoprazole (protonix) 40 mg   Take  30-60 min before first meal of the day and Pepcid (famotidine)  20 mg one  After supper  until return to office - this is the best way to tell whether stomach acid is contributing to your problem.  GERD diet  Only use your albuterol-ipatropium  as a rescue medication   Wear 2lpm at bedtime and during the day as needed to keep your saturation above 90% and adjust the flow to keep it over 90% at all times to help your heart deliver 0xygen without having to strain.    09/01/2018  f/u ov/Abdou Stocks re: doe/ min copd clinically  Chief Complaint  Patient presents with   Follow-up    Breathing is overall doing well and she has not used her neb or o2 recently.   Dyspnea:  Better but not very active Cough: none / throat congetstion is better  Sleeping: able to lie flat one pillow SABA use: none  02: just sometimes  at hs / not checking sats with activity  rec Wear 02 at bedtime  X 2lpm and as needed during the day for short of breath or 02 level less than 90% Nebulizer is only as needed    06/25/2021  f/u ov/Danyal Whitenack re: doe / min copd   maint on nothing has neb prn and started April 20th  p ? URI > changed neb to qid  - now on  fosamax with prominent pseudowheeze  Chief Complaint  Patient presents with   Follow-up    DOE  Dyspnea:  not back to baseline  Cough: not much  Sleeping: wedges doing fine  SABA use:qid neb 02: 2lpm bedtime and 2lpm prn daytime Covid status:   JJ Rec Stop fosamax  the alternatives are Prolia injection (shots twice a year or relclast infusion (once a year)  Pantoprazole (protonix) 40 mg   Take  30-60 min before first meal of the day and Pepcid (famotidine)  20 mg after supper until return to office - this is the best way to tell whether stomach acid is contributing to your problem.   GERD rx Make sure you check your oxygen saturation  AT  your highest level of activity (not after you stop)     07/08/21  ER eval for SOB/ leg swelling     07/13/2021  f/u ov/Takeem Krotzer/ [facility] re: AS/copd/ hypoxemic /hypercarbic resp failure maint on qid duoneb/ budesonide bid   Chief Complaint  Patient presents with   Follow-up    SOB and small amount of cough   Dyspnea:  100 ft / hc parking  Cough: none  Sleeping: able to lie down flatter  SABA use: qid duoneb plus bid pulmocort 02: 2 lpm and prn daytime  Swallowing ok/ cc "indigestion" chronically  Has AS but refused to consider TAVR per daughter Rec Stay on the same nebulizer for  now  Make sure you check your oxygen saturation  AT  your highest level of activity (not after you stop)   to be sure it stays over 90%   I will be referring to ENT in [facility] for the throat wheezing you are experiencing    07/30/2021  f/u ov/Mihail Prettyman re: AS/COPD/hypoxemic/hypercarbic ? VCD    maint on 02   Chief Complaint  Patient presents with    Follow-up  Dyspnea:  sitting at rest sometimes sob now Cough: minimal  Sleeping: wedges under mattress gets about 45% but wakes up sob at nl hour, no pnd SABA use: no extra  02: 2lpm  Rec Adjust the 02 flow for saturations between 90-95% GERD diet reviewed, bed blocks rec    Please remember to go to the  x-ray department  for your tests - we will call you with the results when they are available  Please schedule a follow up visit in 3 months but call sooner if needed  with all medications /inhalers/ solutions in hand      03/12/2022  f/u ov/Annette Huang re: ***   maint on ***  No chief complaint on file.   Dyspnea:  *** Cough: *** Sleeping: *** SABA use: *** 02: *** Covid status:   *** Lung cancer screening :  ***    No obvious day to day or daytime variability or assoc excess/ purulent sputum or mucus plugs or hemoptysis or cp or chest tightness, subjective wheeze or overt sinus or hb symptoms.   *** without nocturnal  or early am exacerbation  of respiratory  c/o's or need for noct saba. Also denies any obvious fluctuation of symptoms with weather or environmental changes or other aggravating or alleviating factors except as outlined above   No unusual exposure hx or h/o childhood pna/ asthma or knowledge of premature birth.  Current Allergies, Complete Past Medical History, Past Surgical History, Family History, and Social History were reviewed in [employer] record.  ROS  The following are not active complaints unless bolded Hoarseness, sore throat, dysphagia, dental problems, itching, sneezing,  nasal congestion or discharge of excess mucus or purulent secretions, ear ache,   fever, chills, sweats, unintended wt loss or wt gain, classically pleuritic or exertional cp,  orthopnea pnd or arm/hand swelling  or leg swelling, presyncope, palpitations, abdominal pain, anorexia, nausea, vomiting, diarrhea  or change in bowel habits or change in bladder habits, change  in stools or change in urine, dysuria, hematuria,  rash, arthralgias, visual complaints, headache, numbness, weakness or ataxia or problems with walking or coordination,  change in mood or  memory.        No outpatient medications have been marked as taking for the 03/12/22 encounter (Appointment) with Tanda Rockers, MD.  Past Medical History:  Diagnosis Date   COPD (chronic obstructive pulmonary disease) ([place])    Coronary artery disease    CVA (cerebral vascular accident) ([place])    Hyperlipidemia    Hypertension    Thoracic aortic atherosclerosis (HCC)       Objective:    Wts  03/12/2022          ***  07/30/2021       151  07/13/2021         145    06/25/21 144 lb 9.6 oz (65.6 kg)  04/18/21 142 lb 6.4 oz (64.6 kg)  10/13/20 142 lb 9.6 oz (64.7 kg)    Vital signs reviewed  03/12/2022  - Note at rest 02 sats  ***% on ***   General appearance:    ***   General appearance:    amb frail edlery wf nad at rest      Min barrel/kyphotic     3/6 sem s 1+ Pitting slt both LE worse on L***       CXR PA and Lateral:   07/30/2021 :    I personally reviewed images and impression is as follows:     Worse L effusion is the only new finding    I personally reviewed images and agree with radiology impression as follows:  CXR:   83094 pa and lateral Small bilateral pleural effusions, new/recurrent compared to most recent chest x-ray of 09/14/2021, but similar to the appearance on earlier chest x-ray of 07/30/2021.        Assessment

## 2022-03-12 ENCOUNTER — Ambulatory Visit: Payer: Medicare HMO | Admitting: Internal Medicine

## 2022-03-12 ENCOUNTER — Encounter: Payer: Self-pay | Admitting: Internal Medicine

## 2022-03-12 VITALS — BP 114/62 | HR 65 | Temp 98.1°F | Ht 62.0 in | Wt 150.0 lb

## 2022-03-12 DIAGNOSIS — R0609 Other forms of dyspnea: Secondary | ICD-10-CM

## 2022-03-12 DIAGNOSIS — J9611 Chronic respiratory failure with hypoxia: Secondary | ICD-10-CM | POA: Diagnosis not present

## 2022-03-12 DIAGNOSIS — J9612 Chronic respiratory failure with hypercapnia: Secondary | ICD-10-CM | POA: Diagnosis not present

## 2022-03-12 NOTE — Patient Instructions (Signed)
Make sure you check your oxygen saturation  AT  your highest level of activity (not after you stop)   to be sure it stays over 90% and adjust  02 flow upward to maintain this level if needed but remember to turn it back to previous settings when you stop (to conserve your supply).   No change in medications   Please schedule a follow up visit in 6  months but call sooner if needed

## 2022-03-13 NOTE — Assessment & Plan Note (Signed)
Quit smoking 06/2021  with emphysema on ct from 06/12/2018 - PFT 02/17/17    FEV1 1.35 [118%], ratio 0.82 with 14% resp to saba and TLC 93%, DLCO 73% - Echo 01/20/2018 c/w mod AS s PH - pt stopped vasotec and anoro on her own prior to pulmonary ov 08/05/2018 > rec leave off and rx for gerd x 4 weeks > improved as of 09/01/2018  - 09/01/2018   Walked RA x one lap =  approx 250 ft avg pace >> stopped due to  Back pain with sats still 94% and min sob off all resp rx  - Echo  04/11/21   1. Left ventricular ejection fraction, by estimation, is 60 to 65%. The  left ventricle has normal function. The left ventricle has no regional  wall motion abnormalities. Left ventricular diastolic parameters are  consistent with Grade II  diastolic dysfunction   2. Right ventricular systolic function is normal. The right ventricular  size is normal. There is mildly elevated pulmonary artery systolic  pressure.   3. Left atrial size was mildly dilated.   4. The mitral valve is normal in structure. No evidence of mitral valve  regurgitation. No evidence of mitral stenosis.   5. The aortic valve is normal in structure. Aortic valve regurgitation is  not visualized. Moderate aortic valve stenosis.   6. The inferior vena cava is normal in size with greater than 50%  respiratory variability, suggesting right atrial pressure of 3 mmHg.  - Spirometry 07/13/2021   FEV1 0.8 (76%)  Ratio 0.62 > 4 h since duoneb   Multifactorial with mod AS/ kyphosis/ mild AB component well compensated on present rx and really nothing more to add other than assuring adequate 02 sats  (see separate a/p)

## 2022-03-13 NOTE — Assessment & Plan Note (Signed)
02 hs x 2lpm as of 08/05/2018 and prn daytime to keep sats > 90%  - 09/01/2018 no desats walking / limited by back  - HC03   07/11/21    = 32    - HC03  01/02/22  =32   Rec continue  2lpm hs and titrate daytime to keep ats > 90% at all times    Each maintenance medication was reviewed in detail including emphasizing most importantly the difference between maintenance and prns and under what circumstances the prns are to be triggered using an action plan format where appropriate.  Total time for H and P, chart review, counseling, reviewing neb/02 device(s) and generating customized AVS unique to this office visit / same day charting = 23 min

## 2022-03-14 ENCOUNTER — Encounter: Payer: Self-pay | Admitting: Cardiology

## 2022-04-23 ENCOUNTER — Encounter: Payer: Self-pay | Admitting: Cardiology

## 2022-04-23 ENCOUNTER — Ambulatory Visit: Payer: Medicare HMO | Attending: Cardiology | Admitting: Cardiology

## 2022-04-23 VITALS — BP 124/56 | HR 70 | Ht 60.0 in | Wt 154.2 lb

## 2022-04-23 DIAGNOSIS — I6990 Unspecified sequelae of unspecified cerebrovascular disease: Secondary | ICD-10-CM

## 2022-04-23 DIAGNOSIS — I2510 Atherosclerotic heart disease of native coronary artery without angina pectoris: Secondary | ICD-10-CM

## 2022-04-23 DIAGNOSIS — E782 Mixed hyperlipidemia: Secondary | ICD-10-CM

## 2022-04-23 DIAGNOSIS — I350 Nonrheumatic aortic (valve) stenosis: Secondary | ICD-10-CM

## 2022-04-23 MED ORDER — FUROSEMIDE 20 MG PO TABS: 20.0000 mg | ORAL_TABLET | Freq: Every day | ORAL | 3 refills | Status: DC

## 2022-04-23 MED ORDER — FUROSEMIDE 40 MG PO TABS: 60.0000 mg | ORAL_TABLET | Freq: Every day | ORAL | 3 refills | Status: DC

## 2022-04-23 NOTE — Progress Notes (Signed)
Cardiology Office Note:    Date:  04/23/2022   ID:  Annette Huang, DOB [birth year], MRN [id number]  PCP:  Annette Schwalbe, PA-C  Cardiologist:  Annette Campus, MD    Referring MD: Annette Schwalbe, PA-C   No chief complaint on file. Doing fine but to have swollen legs  History of Present Illness:    Annette Huang is a 89 y.o. female past medical history significant for aortic stenosis which is moderate to severe based on last assessment, congestive heart failure, COPD which is advanced, she is on oxygen all the time, essential hypertension, late effect of CVA, dyspnea on exertion. She comes today to months for follow-up overall she seems to be doing well shortness of breath is better her daughter tells me that she probably sleeps more than before.  She does have some swelling of lower extremities seems to be difficult to control.  No chest pain tightness squeezing pressure burning chest  Past Medical History:  Diagnosis Date   Aortic stenosis 11/09/2014   Overview:  Mild in 2015 Moderate to severe in the October 2018   At high risk for injury related to fall 10/26/2021   Chronic respiratory failure with hypoxia (HCC) 08/06/2018   02 hs x 2lpm as of 08/05/2018 and prn daytime to keep sats > 90%  - 09/01/2018 no desats walking / limited by back    COPD (chronic obstructive pulmonary disease) (HCC)    Coronary artery disease    CVA (cerebral vascular accident) ([place])    Diastolic congestive heart failure ([place]) 07/31/2018   DOE (dyspnea on exertion) 12/11/2016   Quit smoking 07/22/18 with emphysema on ct from 06/12/2018 - PFT 02/17/17    FEV1 1.35 [118%], ratio 0.82 with 14% resp to saba and TLC 93%, DLCO 73% - Echo 01/20/2018 c/w mod AS s PH - pt stopped vasotec and anoro on her own prior to pulmonary ov 08/05/2018 > rec leave off and rx for gerd x 4 weeks > improved as of 09/01/2018  - 09/01/2018   Walked RA x one lap =  approx 250 ft avg pace >> stopped due to   Essential hypertension  08/18/2013   rec Off acei permanently as of 08/05/2018 due to pseudowheeze    Fatigue 09/04/2021   Greater trochanteric bursitis of right hip 11/13/2017   Hyperlipemia 08/18/2013   Hyperlipidemia    Hypertension    Late effects of CVA (cerebrovascular accident) 11/09/2014   Lumbar degenerative disc disease 06/18/2013   Lumbar spondylosis 06/18/2013   Peripheral vascular disease, unspecified ([place]) status post right carotic endarterectomy 08/28/2018   Sacroiliac inflammation ([place]) 11/13/2017   Sacroiliitis ([place]) 11/13/2017   Smoking 05/23/2015   Spinal stenosis of lumbar region with neurogenic claudication 08/18/2013   Stroke ([place]) 09/08/2013   Supplemental oxygen dependent 10/26/2021   Thoracic aortic atherosclerosis (HCC)     Past Surgical History:  Procedure Laterality Date   CAROTID ENDARTERECTOMY Bilateral    NECK SURGERY      Current Medications: Current Meds  Medication Sig   albuterol (VENTOLIN HFA) 108 (90 Base) MCG/ACT inhaler Inhale 1 puff into the lungs as needed for wheezing or shortness of breath.   ALPRAZolam (XANAX) 0.25 MG tablet Take 0.25 mg by mouth daily as needed for anxiety or sleep.   amLODipine (NORVASC) 2.5 MG tablet Take 1 tablet (2.5 mg total) by mouth daily.   atorvastatin (LIPITOR) 40 MG tablet Take 1 tablet (40 mg total) by mouth daily.   budesonide (  PULMICORT) 0.25 MG/2ML nebulizer solution Take 2 mLs (0.25 mg total) by nebulization 2 (two) times daily.   clopidogrel (PLAVIX) 75 MG tablet Take 75 mg by mouth daily.    Cyanocobalamin (VITAMIN B12 PO) Take 1 tablet by mouth daily. Unknown strenght   famotidine (PEPCID) 20 MG tablet One after supper   furosemide (LASIX) 40 MG tablet Take 1 tablet (40 mg total) by mouth daily. Take an extra 40mg  with weight gain >3lbs overnight or 5lbs in 1 week.   ipratropium-albuterol (DUONEB) 0.5-2.5 (3) MG/3ML SOLN INHALE CONTENTS OF 1 VIAL IN NEBULIZER EVERY 4 HOURS AS NEEDED WHEEZING AND SHORTNESS OF BREATH    ketoconazole (NIZORAL) 2 % cream Apply between toes 4 and 5 of right foot for 3 weeks for burning pain-  twice daily   Lactobacillus Rhamnosus, GG, (CULTURELLE) CAPS Take 1 capsule by mouth daily.   levothyroxine (SYNTHROID, LEVOTHROID) 50 MCG tablet Take 1 tablet daily by mouth.   lidocaine (LIDODERM) 5 % Place 1 patch onto the skin as needed for pain.   magnesium oxide (MAG-OX) 400 (240 Mg) MG tablet Take 1 tablet by mouth 2 (two) times daily.   metoprolol tartrate (LOPRESSOR) 25 MG tablet TAKE 1 TABLET BY MOUTH 2 TIMES DAILY.   OXYGEN Inhale 2 L into the lungs as needed (Mainly at bedtime). 2lpm as needed   pantoprazole (PROTONIX) 40 MG tablet Take 40 mg by mouth daily.   potassium chloride (MICRO-K) 10 MEQ CR capsule Take 1 capsule (10 mEq total) by mouth daily.   traMADol (ULTRAM) 50 MG tablet Take 50 mg by mouth as needed for moderate pain or severe pain.   traZODone (DESYREL) 50 MG tablet Take 50 mg by mouth as needed for sleep.   Vitamin D, Ergocalciferol, (DRISDOL) 1.25 MG (50000 UT) CAPS capsule Take 50,000 Units by mouth 2 (two) times a week.     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  Tobacco Use   Smoking status: Former    Packs/day: 1.00    Years: 35.00    Additional pack years: 0.00    Total pack years: 35.00    Types: Cigarettes    Quit date: 2023    Years since quitting: 1.2   Smokeless tobacco: Never  Vaping Use   Vaping Use: Former  Substance and Sexual Activity   Alcohol use: No   Drug use: No   Sexual activity: Not on file  Other Topics Concern   Not on file  Social History Narrative   Not on file   Social Determinants of Health   Financial Resource Strain: Not on file  Food Insecurity: Not on file   Transportation Needs: Not on file  Physical Activity: Not on file  Stress: Not on file  Social Connections: Not on file     Family History: The patient's family history includes Heart disease in her father and mother; Hypertension in her mother. ROS:   Please see the history of present illness.    All 14 point review of systems negative except as described per history of present illness  EKGs/Labs/Other Studies Reviewed:      Recent Labs: 09/28/2021: Magnesium 1.8; NT-Pro BNP 674 11/19/2021: BUN  22; Creatinine, Ser 1.53; Potassium 4.2; Sodium 145  Recent Lipid Panel    Component Value Date/Time   CHOL 162 11/19/2021 1123   TRIG 90 11/19/2021 1123   HDL 61 11/19/2021 1123   CHOLHDL 2.7 11/19/2021 1123   LDLCALC 84 11/19/2021 1123    Physical Exam:    VS:  BP (!) 124/56   Pulse 70   Ht 5' (1.524 m)   Wt 154 lb 3.2 oz (69.9 kg)   SpO2 96%   BMI 30.12 kg/m     Wt Readings from Last 3 Encounters:  04/23/22 154 lb 3.2 oz (69.9 kg)  03/12/22 150 lb (68 kg)  11/19/21 149 lb (67.6 kg)     GEN:  Well nourished, well developed in no acute distress HEENT: Normal NECK: No JVD; No carotid bruits LYMPHATICS: No lymphadenopathy CARDIAC: RRR, systolic ejection murmur grade 2/6 to 3/6 best heard right upper portion of the sternal mid-to-late peaking, no rubs, no gallops RESPIRATORY:  Clear to auscultation without rales, wheezing or rhonchi  ABDOMEN: Soft, non-tender, non-distended MUSCULOSKELETAL:  No edema; No deformity  SKIN: Warm and dry LOWER EXTREMITIES: no swelling NEUROLOGIC:  Alert and oriented x 3 PSYCHIATRIC:  Normal affect   ASSESSMENT:    1. Nonrheumatic aortic valve stenosis   2. Coronary artery disease involving native coronary artery of native heart without angina pectoris   3. Mixed hyperlipidemia   4. Late effects of CVA (cerebrovascular accident)    PLAN:    In order of problems listed above:  Nonrheumatic aortic valve stenosis which is moderate  to severe.  I will discontinue her amlodipine put her on very small dose of diuretic and I told her daughter to give it to her for 2 or 3 days to see if swelling will get better if swelling gets better then she needs to stop.  If she will continue taking Lasix for 5 more days we need to recheck Chem-7 concerns about her potassium which I reviewed labs done by primary care physician it was 4.4, her kidney dysfunction creatinine 1.52 so need to be careful about the diuretic. Coronary artery disease asymptomatic, denies have any chest pain tightness squeezing pressure burning chest. Continue present management. Mixed dyslipidemia I did review K PN which show me her LDL 84 HDL 61.  She is taking high intense statin for of Lipitor 40 which I will continue. Late effect of CVA stable   Medication Adjustments/Labs and Tests Ordered: Current medicines are reviewed at length with the patient today.  Concerns regarding medicines are outlined above.  No orders of the defined types were placed in this encounter.  Medication changes: No orders of the defined types were placed in this encounter.   Signed, Park Liter, MD, [facility] 04/23/2022 3:00 PM    [place]

## 2022-04-23 NOTE — Addendum Note (Signed)
Addended by: Edwyna Shell I on: 04/23/2022 03:31 PM   Modules accepted: Orders

## 2022-04-23 NOTE — Patient Instructions (Addendum)
Medication Instructions:  Your physician has recommended you make the following change in your medication:   STOP: Amlodipine START: Lasix 60 mg daily  *If you need a refill on your cardiac medications before your next appointment, please call your pharmacy*   Lab Work: Your physician recommends that you return for lab work in:   Labs: BMP  If you have labs (blood work) drawn today and your tests are completely normal, you will receive your results only by: [place] (if you have MyChart) OR A paper copy in the mail If you have any lab test that is abnormal or we need to change your treatment, we will call you to review the results.   Testing/Procedures: None   Follow-Up: At [facility], you and your health needs are our priority.  As part of our continuing mission to provide you with exceptional heart care, we have created designated Provider Care Teams.  These Care Teams include your primary Cardiologist (physician) and Advanced Practice Providers (APPs -  Physician Assistants and Nurse Practitioners) who all work together to provide you with the care you need, when you need it.  We recommend signing up for the patient portal called "MyChart".  Sign up information is provided on this After Visit Summary.  MyChart is used to connect with patients for Virtual Visits (Telemedicine).  Patients are able to view lab/test results, encounter notes, upcoming appointments, etc.  Non-urgent messages can be sent to your provider as well.   To learn more about what you can do with MyChart, go to [email or web address].    Your next appointment:   6 month(s)  Provider:   Jenne Campus, MD    Other Instructions If lower extremity swelling goes down in 3 days then go back down to Lasix 40 and don't have lab draw. If lower extremity swelling goes down in 5 days or more then go back down to Lasix 40 mg and have lab draw completed.

## 2022-04-24 DIAGNOSIS — D631 Anemia in chronic kidney disease: Secondary | ICD-10-CM

## 2022-04-24 HISTORY — DX: Anemia in chronic kidney disease: D63.1

## 2022-05-03 LAB — BASIC METABOLIC PANEL
BUN/Creatinine Ratio: 15 (ref 12–28)
BUN: 22 mg/dL (ref 10–36)
CO2: 28 mmol/L (ref 20–29)
Calcium: 9.7 mg/dL (ref 8.7–10.3)
Chloride: 101 mmol/L (ref 96–106)
Creatinine, Ser: 1.51 mg/dL — ABNORMAL HIGH (ref 0.57–1.00)
Glucose: 81 mg/dL (ref 70–99)
Potassium: 4 mmol/L (ref 3.5–5.2)
Sodium: 145 mmol/L — ABNORMAL HIGH (ref 134–144)
eGFR: 32 mL/min/{1.73_m2} — ABNORMAL LOW (ref >59)

## 2022-05-08 ENCOUNTER — Telehealth: Payer: Self-pay

## 2022-05-08 NOTE — Telephone Encounter (Signed)
Left message on My Chart with normal results per Dr. Krasowski's note. Routed to PCP. 

## 2022-05-10 ENCOUNTER — Telehealth: Payer: Self-pay

## 2022-05-10 NOTE — Telephone Encounter (Signed)
Pt viewed results in My Chart. Routed to PCP.  

## 2022-06-13 ENCOUNTER — Telehealth: Payer: Self-pay | Admitting: Cardiology

## 2022-06-13 ENCOUNTER — Other Ambulatory Visit: Payer: Self-pay

## 2022-06-13 MED ORDER — AMLODIPINE BESYLATE 2.5 MG PO TABS: 2.5000 mg | ORAL_TABLET | Freq: Every day | ORAL | 3 refills | Status: DC

## 2022-06-13 NOTE — Telephone Encounter (Signed)
Pt c/o BP issue: STAT if pt c/o blurred vision, one-sided weakness or slurred speech  1. What are your last 5 BP readings?  06/722:  164/88 at PCP 171/75 at home  06/12/22: 192/96  06/13/22: 160/73  2. Are you having any other symptoms (ex. Dizziness, headache, blurred vision, passed out)? Headache on Monday, Swelling in wrist (believes it was tendinitis) as well as swelling legs, & Patient doesn't feel well  3. What is your BP issue? Hypertension.   Patient was put on antibiotic for severe cough, to prevent pneumonia. She was taken off of magnesium due to being on it.

## 2022-06-13 NOTE — Telephone Encounter (Signed)
Called the patient's daughter Annette Huang and she reported that the patient had been having high blood pressures:  06/722:  164/88 at PCP 171/75 at home  06/12/22: 192/96  06/13/22: 160/73  She had also had a headache on Monday and currently was having some swelling in her wrist and lower extremities. Spoke with Dr. Bing Matter and he recommended starting Amlodipine 2.5 mg daily. I informed the patient's daughter regarding Dr. Vanetta Shawl recommendation and she was agreeable with that plan. The Amlodipine medication was ordered via Epic and sent to the patient's pharmacy. Annette Huang had no further questions at this time.

## 2022-06-17 ENCOUNTER — Telehealth: Payer: Self-pay | Admitting: Cardiology

## 2022-06-17 NOTE — Telephone Encounter (Signed)
Scheduled patient to see Dr. Bing Matter in [place] on 5/14 at 9:00 am

## 2022-06-18 ENCOUNTER — Telehealth: Payer: Self-pay

## 2022-06-18 ENCOUNTER — Ambulatory Visit: Payer: Medicare HMO | Attending: Cardiology | Admitting: Cardiology

## 2022-06-18 ENCOUNTER — Encounter: Payer: Self-pay | Admitting: Cardiology

## 2022-06-18 VITALS — BP 112/52 | HR 58 | Ht 59.0 in | Wt 148.8 lb

## 2022-06-18 DIAGNOSIS — I6990 Unspecified sequelae of unspecified cerebrovascular disease: Secondary | ICD-10-CM

## 2022-06-18 DIAGNOSIS — J9611 Chronic respiratory failure with hypoxia: Secondary | ICD-10-CM | POA: Diagnosis not present

## 2022-06-18 DIAGNOSIS — I129 Hypertensive chronic kidney disease with stage 1 through stage 4 chronic kidney disease, or unspecified chronic kidney disease: Secondary | ICD-10-CM

## 2022-06-18 DIAGNOSIS — N183 Chronic kidney disease, stage 3 unspecified: Secondary | ICD-10-CM

## 2022-06-18 DIAGNOSIS — I350 Nonrheumatic aortic (valve) stenosis: Secondary | ICD-10-CM | POA: Diagnosis not present

## 2022-06-18 DIAGNOSIS — I739 Peripheral vascular disease, unspecified: Secondary | ICD-10-CM

## 2022-06-18 MED ORDER — FUROSEMIDE 40 MG PO TABS: 60.0000 mg | ORAL_TABLET | Freq: Every day | ORAL | 3 refills | Status: DC

## 2022-06-18 NOTE — Patient Instructions (Addendum)
Medication Instructions:  Your physician has recommended you make the following change in your medication:   START: Lasix 60 mg daily STOP: Hydralazine  *If you need a refill on your cardiac medications before your next appointment, please call your pharmacy*   Lab Work: Your physician recommends that you return for lab work in:   Labs on Friday: BMP  If you have labs (blood work) drawn today and your tests are completely normal, you will receive your results only by: MyChart Message (if you have MyChart) OR A paper copy in the mail If you have any lab test that is abnormal or we need to change your treatment, we will call you to review the results.   Testing/Procedures: None   Follow-Up: At [facility], you and your health needs are our priority.  As part of our continuing mission to provide you with exceptional heart care, we have created designated Provider Care Teams.  These Care Teams include your primary Cardiologist (physician) and Advanced Practice Providers (APPs -  Physician Assistants and Nurse Practitioners) who all work together to provide you with the care you need, when you need it.  We recommend signing up for the patient portal called "MyChart".  Sign up information is provided on this After Visit Summary.  MyChart is used to connect with patients for Virtual Visits (Telemedicine).  Patients are able to view lab/test results, encounter notes, upcoming appointments, etc.  Non-urgent messages can be sent to your provider as well.   To learn more about what you can do with MyChart, go to [email or web address].    Your next appointment:   1 month(s)  Provider:   Gypsy Balsam, MD    Other Instructions Patient declined EKG chaperone

## 2022-06-18 NOTE — Progress Notes (Unsigned)
Cardiology Office Note:    Date:  06/18/2022   ID:  Annette Huang, DOB [birth year], MRN [id number]  PCP:  Leane Call, PA-C  Cardiologist:  Gypsy Balsam, MD    Referring MD: Leane Call, PA-C   Chief Complaint  Patient presents with   Hospitalization Follow-up    Seen at [facility] 06/16/2022 Started meds Hydralazine 25mg   1/2 tab tid, cefdinir 300 qd for 5 days , benzonatate 200 tid prn cough   Congestive Heart Failure        Hypertension    History of Present Illness:    Annette Huang is a 89 y.o. female with past medical history significant for aortic stenosis which was assessed as moderate to severe couple years ago but recently she was in the hospital when she got echocardiogram done aortic stenosis has been described as mild with mean gradient of only 15.  Additional problem include diastolic congestive heart failure, COPD she is oxygen dependent, late effect of CVA, essential hypertension.  Recently she ended being in [facility] because of pneumonia.  She was also managed for congestive heart failure.  She was diuresed few pounds comes today for follow-up.  She still have IV in her arm with intention to give her another shot of IV Lasix.  But she looks pretty good on the physical exam today.  She says she is feeling fine.  She is oxygen all the time she said she does not do much she sits in the chair usually but she feels good overall.  Past Medical History:  Diagnosis Date   Aortic stenosis 11/09/2014   Overview:  Mild in 2015 Moderate to severe in the October 2018   At high risk for injury related to fall 10/26/2021   Chronic respiratory failure with hypoxia (HCC) 08/06/2018   02 hs x 2lpm as of 08/05/2018 and prn daytime to keep sats > 90%  - 09/01/2018 no desats walking / limited by back    COPD (chronic obstructive pulmonary disease) (HCC)    Coronary artery disease    CVA (cerebral vascular accident) (HCC)    Diastolic congestive heart  failure (HCC) 07/31/2018   DOE (dyspnea on exertion) 12/11/2016   Quit smoking 07/22/18 with emphysema on ct from 06/12/2018 - PFT 02/17/17    FEV1 1.35 [118%], ratio 0.82 with 14% resp to saba and TLC 93%, DLCO 73% - Echo 01/20/2018 c/w mod AS s PH - pt stopped vasotec and anoro on her own prior to pulmonary ov 08/05/2018 > rec leave off and rx for gerd x 4 weeks > improved as of 09/01/2018  - 09/01/2018   Walked RA x one lap =  approx 250 ft avg pace >> stopped due to   Essential hypertension 08/18/2013   rec Off acei permanently as of 08/05/2018 due to pseudowheeze    Fatigue 09/04/2021   Greater trochanteric bursitis of right hip 11/13/2017   Hyperlipemia 08/18/2013   Hyperlipidemia    Hypertension    Late effects of CVA (cerebrovascular accident) 11/09/2014   Lumbar degenerative disc disease 06/18/2013   Lumbar spondylosis 06/18/2013   Peripheral vascular disease, unspecified (HCC) status post right carotic endarterectomy 08/28/2018   Sacroiliac inflammation (HCC) 11/13/2017   Sacroiliitis (HCC) 11/13/2017   Smoking 05/23/2015   Spinal stenosis of lumbar region with neurogenic claudication 08/18/2013   Stroke (HCC) 09/08/2013   Supplemental oxygen dependent 10/26/2021   Thoracic aortic atherosclerosis [facility])     Past Surgical History:  Procedure Laterality  Date   CAROTID ENDARTERECTOMY Bilateral    NECK SURGERY      Current Medications: Current Meds  Medication Sig   albuterol (VENTOLIN HFA) 108 (90 Base) MCG/ACT inhaler Inhale 1 puff into the lungs as needed for wheezing or shortness of breath.   ALPRAZolam (XANAX) 0.25 MG tablet Take 0.25 mg by mouth daily as needed for anxiety or sleep.   amLODipine (NORVASC) 2.5 MG tablet Take 1 tablet (2.5 mg total) by mouth daily.   atorvastatin (LIPITOR) 40 MG tablet Take 1 tablet (40 mg total) by mouth daily.   benzonatate (TESSALON) 200 MG capsule Take 200 mg by mouth 3 (three) times daily as needed for cough.   budesonide (PULMICORT) 0.25  MG/2ML nebulizer solution Take 2 mLs (0.25 mg total) by nebulization 2 (two) times daily.   cefdinir (OMNICEF) 300 MG capsule Take 300 mg by mouth daily.   clopidogrel (PLAVIX) 75 MG tablet Take 75 mg by mouth daily.    Cyanocobalamin (VITAMIN B12 PO) Take 1 tablet by mouth daily. Unknown strenght   famotidine (PEPCID) 20 MG tablet One after supper (Patient taking differently: Take 20 mg by mouth daily after supper. One after supper)   furosemide (LASIX) 40 MG tablet Take 1.5 tablets (60 mg total) by mouth daily.   hydrALAZINE (APRESOLINE) 25 MG tablet Take 12.5 mg by mouth 3 (three) times daily.   ipratropium-albuterol (DUONEB) 0.5-2.5 (3) MG/3ML SOLN INHALE CONTENTS OF 1 VIAL IN NEBULIZER EVERY 4 HOURS AS NEEDED WHEEZING AND SHORTNESS OF BREATH (Patient taking differently: Take 3 mLs by nebulization every 6 (six) hours as needed (SOB).)   ketoconazole (NIZORAL) 2 % cream Apply between toes 4 and 5 of right foot for 3 weeks for burning pain-  twice daily (Patient taking differently: Apply 1 Application topically 3 (three) times a week. Apply between toes 4 and 5 of right foot for 3 weeks for burning pain-  twice daily)   Lactobacillus Rhamnosus, GG, (CULTURELLE) CAPS Take 1 capsule by mouth daily.   levothyroxine (SYNTHROID, LEVOTHROID) 50 MCG tablet Take 1 tablet daily by mouth.   lidocaine (LIDODERM) 5 % Place 1 patch onto the skin as needed for pain.   magnesium oxide (MAG-OX) 400 (240 Mg) MG tablet Take 1 tablet by mouth 2 (two) times daily.   metoprolol tartrate (LOPRESSOR) 25 MG tablet TAKE 1 TABLET BY MOUTH 2 TIMES DAILY.   OXYGEN Inhale 2 L into the lungs as needed (Mainly at bedtime). 2lpm as needed   pantoprazole (PROTONIX) 40 MG tablet Take 40 mg by mouth daily.   potassium chloride (MICRO-K) 10 MEQ CR capsule Take 1 capsule (10 mEq total) by mouth daily.   traMADol (ULTRAM) 50 MG tablet Take 50 mg by mouth as needed for moderate pain or severe pain.   traZODone (DESYREL) 50 MG tablet  Take 50 mg by mouth as needed for sleep.   Vitamin D, Ergocalciferol, (DRISDOL) 1.25 MG (50000 UT) CAPS capsule Take 50,000 Units by mouth 2 (two) times a week.     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  Tobacco Use   Smoking status: Former    Packs/day: 1.00    Years: 35.00  Additional pack years: 0.00    Total pack years: 35.00    Types: Cigarettes    Quit date: 2023    Years since quitting: 1.3   Smokeless tobacco: Never  Vaping Use   Vaping Use: Former  Substance and Sexual Activity   Alcohol use: No   Drug use: No   Sexual activity: Not on file  Other Topics Concern   Not on file  Social History Narrative   Not on file   Social Determinants of Health   Financial Resource Strain: Not on file  Food Insecurity: Not on file  Transportation Needs: Not on file  Physical Activity: Not on file  Stress: Not on file  Social Connections: Not on file     Family History: The patient's family history includes Heart disease in her father and mother; Hypertension in her mother. ROS:   Please see the history of present illness.    All 14 point review of systems negative except as described per history of present illness  EKGs/Labs/Other Studies Reviewed:      Recent Labs: 09/28/2021: Magnesium 1.8; NT-Pro BNP 674 05/02/2022: BUN 22; Creatinine, Ser 1.51; Potassium 4.0; Sodium 145  Recent Lipid Panel    Component Value Date/Time   CHOL 162 11/19/2021 1123   TRIG 90 11/19/2021 1123   HDL 61 11/19/2021 1123   CHOLHDL 2.7 11/19/2021 1123   LDLCALC 84 11/19/2021 1123    Physical Exam:    VS:  BP (!) 112/52 (BP Location: Left Arm, Patient Position: Sitting)   Pulse (!) 58   Ht 4\' 11"  (1.499 m)   Wt 148 lb 12.8 oz  (67.5 kg)   SpO2 97%   BMI 30.05 kg/m     Wt Readings from Last 3 Encounters:  06/18/22 148 lb 12.8 oz (67.5 kg)  04/23/22 154 lb 3.2 oz (69.9 kg)  03/12/22 150 lb (68 kg)     GEN:  Well nourished, well developed in no acute distress HEENT: Normal NECK: No JVD; No carotid bruits LYMPHATICS: No lymphadenopathy CARDIAC: RRR, no murmurs, furuncles RESPIRATORY:  Clear to auscultation without rales, wheezing or rhonchi  ABDOMEN: Soft, non-tender, non-distended MUSCULOSKELETAL:  No edema; No deformity  SKIN: Warm and dry LOWER EXTREMITIES: Minimal swelling NEUROLOGIC:  Alert and oriented x 3 PSYCHIATRIC:  Normal affect   ASSESSMENT:    1. Nonrheumatic aortic valve stenosis   2. Benign hypertension with chronic kidney disease, stage III (HCC)   3. Peripheral vascular disease, unspecified (HCC) status post right carotic endarterectomy   4. Chronic respiratory failure with hypoxia (HCC)   5. Late effects of CVA (cerebrovascular accident)    PLAN:    In order of problems listed above:  Nonrheumatic aortic valve stenosis look like mild based on last echocardiogram.  Will continue monitoring. Congestive heart failure with some diastolic component.  She has been getting IV Lasix.  She does have IV inserted in her left arm which I will remove today.  Today nurse supposed to come to her home and give her another shot of Lasix which I do not think she needs intravenously.  I will simply start giving her Lasix 60 mg daily, will check Chem-7 on Friday.  She also will have home visiting nurse coming on the end of the week. Essential hypertension.  Blood pressure actually on the lower side and will discontinue her hydralazine. Chronic respiratory failure.  Noted. Late effect CVA.  Noted   Medication Adjustments/Labs and Tests Ordered: Current medicines are reviewed at  length with the patient today.  Concerns regarding medicines are outlined above.  No orders of the defined types were placed  in this encounter.  Medication changes: No orders of the defined types were placed in this encounter.   Signed, Georgeanna Lea, MD, [facility] 06/18/2022 9:59 AM    [facility]

## 2022-06-18 NOTE — Telephone Encounter (Signed)
Patient came to her office appointment with her peripheral IV from the hospital in her left forearm. Received order from Dr. Bing Matter to D/C the IV. Patient's IV was D/C'ed from her left forearm with catheter tip in tact. Patient had no further questions at this time.

## 2022-06-25 LAB — BASIC METABOLIC PANEL
BUN/Creatinine Ratio: 16 (ref 12–28)
BUN: 22 mg/dL (ref 10–36)
CO2: 28 mmol/L (ref 20–29)
Calcium: 10.5 mg/dL — ABNORMAL HIGH (ref 8.7–10.3)
Chloride: 99 mmol/L (ref 96–106)
Creatinine, Ser: 1.38 mg/dL — ABNORMAL HIGH (ref 0.57–1.00)
Glucose: 119 mg/dL — ABNORMAL HIGH (ref 70–99)
Potassium: 4 mmol/L (ref 3.5–5.2)
Sodium: 140 mmol/L (ref 134–144)
eGFR: 36 mL/min/{1.73_m2} — ABNORMAL LOW (ref >59)

## 2022-06-26 ENCOUNTER — Telehealth: Payer: Self-pay

## 2022-06-26 NOTE — Telephone Encounter (Signed)
-----   Message from Georgeanna Lea, MD sent at 06/26/2022  9:12 AM EDT ----- Chem-7 looks fine, continue present management

## 2022-06-26 NOTE — Telephone Encounter (Signed)
Patient notified through my chart.

## 2022-07-02 DIAGNOSIS — I5033 Acute on chronic diastolic (congestive) heart failure: Secondary | ICD-10-CM

## 2022-07-02 DIAGNOSIS — J90 Pleural effusion, not elsewhere classified: Secondary | ICD-10-CM | POA: Insufficient documentation

## 2022-07-02 HISTORY — DX: Acute on chronic diastolic (congestive) heart failure: I50.33

## 2022-07-02 HISTORY — DX: Pleural effusion, not elsewhere classified: J90

## 2022-07-02 HISTORY — DX: Hypomagnesemia: E83.42

## 2022-07-10 ENCOUNTER — Telehealth: Payer: Self-pay | Admitting: Internal Medicine

## 2022-07-10 NOTE — Telephone Encounter (Signed)
Patient's daughter would like the nurse to call her to give advice on what needs to be done before her appt. On 6/11.  Patient has a collapsed lung and she wants to know if there is something she should do.  Please advise and call to discuss at [phone]

## 2022-07-11 DIAGNOSIS — M79674 Pain in right toe(s): Secondary | ICD-10-CM

## 2022-07-11 HISTORY — DX: Pain in right toe(s): M79.674

## 2022-07-16 ENCOUNTER — Encounter: Payer: Self-pay | Admitting: Internal Medicine

## 2022-07-16 ENCOUNTER — Ambulatory Visit: Payer: Medicare HMO | Admitting: Internal Medicine

## 2022-07-16 VITALS — BP 120/72 | HR 74 | Temp 98.4°F | Ht 59.0 in | Wt 141.4 lb

## 2022-07-16 DIAGNOSIS — T17998A Other foreign object in respiratory tract, part unspecified causing other injury, initial encounter: Secondary | ICD-10-CM

## 2022-07-16 DIAGNOSIS — J471 Bronchiectasis with (acute) exacerbation: Secondary | ICD-10-CM | POA: Diagnosis not present

## 2022-07-16 DIAGNOSIS — J4489 Other specified chronic obstructive pulmonary disease: Secondary | ICD-10-CM | POA: Diagnosis not present

## 2022-07-16 DIAGNOSIS — J9611 Chronic respiratory failure with hypoxia: Secondary | ICD-10-CM

## 2022-07-16 DIAGNOSIS — J439 Emphysema, unspecified: Secondary | ICD-10-CM

## 2022-07-16 MED ORDER — ALBUTEROL SULFATE (2.5 MG/3ML) 0.083% IN NEBU: 2.5000 mg | INHALATION_SOLUTION | Freq: Four times a day (QID) | RESPIRATORY_TRACT | 12 refills | Status: DC | PRN

## 2022-07-16 MED ORDER — SODIUM CHLORIDE 3 % IN NEBU: INHALATION_SOLUTION | Freq: Three times a day (TID) | RESPIRATORY_TRACT | 12 refills | Status: DC | PRN

## 2022-07-16 NOTE — Progress Notes (Signed)
Annette Huang    [id number]    [birth year]  Primary Care Physician:Nodal, Joline Salt, PA-C Date of Appointment: 07/16/2022 Established Patient Visit  Chief complaint:   Chief Complaint  Patient presents with   Acute Visit    SOB      HPI: Annette Huang is a 89 y.o. woman with shortness of breath. Last seen by Dr. Sherene Sires in Feb 2024. Spirometry June 2023 shows moderate airflow limitation FEV1 62% of predicted. COPD given history of tobacco use. Also on [facility]. Significant cardiac issues - chronic hfpef and moderate AS. History of CVA.  Interval Updates: Here for acute visit. Daughter provides most of the history. On 2-2.5L. First thing in the morning sats are lower 85-90%. Now needing more like 4L [place].  Fatigued, lethargic, worsening shortness of breath. Hospitalized in May for pneumonia, similar symptoms.  Seen last week by pcp. Had CT scan which showed some mucus plugging and atelectasis. Had another course of antibiotics.   Taking budesonide nebs twice a day. Taking duonebs 3-4 times/day. Using flutter valve. Not as effective.  Worsening shortness of breath overall.  Has needed abx and hospitalization twice in the last six months.    I have reviewed the patient's family social and past medical history and updated as appropriate.   Past Medical History:  Diagnosis Date   Aortic stenosis 11/09/2014   Overview:  Mild in 2015 Moderate to severe in the October 2018   At high risk for injury related to fall 10/26/2021   Chronic respiratory failure with hypoxia (HCC) 08/06/2018   02 hs x 2lpm as of 08/05/2018 and prn daytime to keep sats > 90%  - 09/01/2018 no desats walking / limited by back    COPD (chronic obstructive pulmonary disease) (HCC)    Coronary artery disease    CVA (cerebral vascular accident) (HCC)    Diastolic congestive heart failure (HCC) 07/31/2018   DOE (dyspnea on exertion) 12/11/2016   Quit smoking 07/22/18 with emphysema on ct from 06/12/2018 - PFT  02/17/17    FEV1 1.35 [118%], ratio 0.82 with 14% resp to saba and TLC 93%, DLCO 73% - Echo 01/20/2018 c/w mod AS s PH - pt stopped vasotec and anoro on her own prior to pulmonary ov 08/05/2018 > rec leave off and rx for gerd x 4 weeks > improved as of 09/01/2018  - 09/01/2018   Walked RA x one lap =  approx 250 ft avg pace >> stopped due to   Essential hypertension 08/18/2013   rec Off acei permanently as of 08/05/2018 due to pseudowheeze    Fatigue 09/04/2021   Greater trochanteric bursitis of right hip 11/13/2017   Hyperlipemia 08/18/2013   Hyperlipidemia    Hypertension    Late effects of CVA (cerebrovascular accident) 11/09/2014   Lumbar degenerative disc disease 06/18/2013   Lumbar spondylosis 06/18/2013   Peripheral vascular disease, unspecified (HCC) status post right carotic endarterectomy 08/28/2018   Sacroiliac inflammation (HCC) 11/13/2017   Sacroiliitis (HCC) 11/13/2017   Smoking 05/23/2015   Spinal stenosis of lumbar region with neurogenic claudication 08/18/2013   Stroke (HCC) 09/08/2013   Supplemental oxygen dependent 10/26/2021   Thoracic aortic atherosclerosis (HCC)     Past Surgical History:  Procedure Laterality Date   CAROTID ENDARTERECTOMY Bilateral    NECK SURGERY      Family History  Problem Relation Age of Onset   Heart disease Mother    Hypertension Mother  Heart disease Father     Social History   Occupational History   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  Gen:      No acute distress ENT:  on nasal cannula Lungs:    kyphosis, diminished breath sounds,  CV:         RRR, systolic murmur, mild le edema   Data Reviewed: Imaging: I have personally reviewed the chest xray Sept 2023 shows bilateral pleural effusions. Her chest ct from June 2024 shows mucus plugging, bronchiectasis   PFTs:     Latest Ref Rng & Units 02/17/2017   12:50 PM  PFT Results  FVC-Pre L 1.50   FVC-Predicted Pre % 94   FVC-Post L 1.64   FVC-Predicted Post % 103   Pre FEV1/FVC % % 79   Post FEV1/FCV % % 82   FEV1-Pre L 1.18   FEV1-Predicted Pre % 103   FEV1-Post L 1.35   DLCO uncorrected ml/min/mmHg 11.64   DLCO UNC% % 71   DLCO corrected ml/min/mmHg 11.94   DLCO COR %Predicted % 73   DLVA Predicted % 88   TLC L 3.90   TLC % Predicted % 93   RV % Predicted % 94    I have personally reviewed the patient's PFTs and no airflow limitation in 2019  Labs:  Immunization status: Immunization History  Administered Date(s) Administered   Influenza Split 12/09/2016   Influenza-Unspecified 01/04/2013, 10/05/2013, 11/04/2017    External Records Personally Reviewed: pulmonary, hospital stay  Assessment:  COPD with bronchiectasis with lower respiratory tract infection Mucus plugging Chronic respiratory failure Moderate aortic stenosis   Plan/Recommendations:  Worsening control with needing antibiotics at least twice in the last six months. Has daily productive cough for the last year. Has failed flutter valve.   Overall CT scan from last week shows improvement however still with mucus plugging  Stop duonebs. Switch to albuterol nebs only up to 4 times daily as needed.  Start with albuterol first, then use a saline nebulizer treatment. Use with flutter valve and/or vest therapy after this.  I am placing the order for the vest today - they will call you to deliver and set it up.   Ok to take  guaifenesin to loosen mucus. Don't take anything drying like pseudoephedrine or an anti-histamine.   We can re-evaluate your oxygen needs at next visit and walk you around to make sure they are improving.     Return to Care: Return in about 2 weeks (around 07/30/2022).   Durel Salts, MD Pulmonary and Critical Care Medicine [facility] Office:[phone]

## 2022-07-16 NOTE — Patient Instructions (Addendum)
Please schedule follow up scheduled with Dr Sherene Sires or APP in 2-3 weeks  Overall CT scan from last week shows improvement.   Stop duonebs. Switch to albuterol nebs only up to 4 times daily as needed.  Start with albuterol first, then use a saline nebulizer treatment. Use with flutter valve and/or vest therapy after this.  I am placing the order for the vest today - they will call you to deliver and set it up.   Ok to take guaifenesin to loosen mucus. Don't take anything drying like pseudoephedrine or an anti-histamine.   We can re-evaluate your oxygen needs at next visit and walk you around to make sure they are improving.

## 2022-07-22 ENCOUNTER — Encounter: Payer: Self-pay | Admitting: Cardiology

## 2022-07-22 ENCOUNTER — Ambulatory Visit: Payer: Medicare HMO | Attending: Cardiology | Admitting: Cardiology

## 2022-07-22 VITALS — BP 122/60 | HR 64 | Ht <= 58 in | Wt 140.4 lb

## 2022-07-22 DIAGNOSIS — I5032 Chronic diastolic (congestive) heart failure: Secondary | ICD-10-CM | POA: Diagnosis not present

## 2022-07-22 DIAGNOSIS — J9611 Chronic respiratory failure with hypoxia: Secondary | ICD-10-CM

## 2022-07-22 DIAGNOSIS — I700 Atherosclerosis of aorta: Secondary | ICD-10-CM | POA: Diagnosis not present

## 2022-07-22 NOTE — Addendum Note (Signed)
Addended by: Baldo Ash D on: 07/22/2022 11:05 AM   Modules accepted: Orders

## 2022-07-22 NOTE — Patient Instructions (Signed)
Medication Instructions:   INCREASE Magnesium 400mg  to 3 times daily   Lab Work: Your physician recommends that you return for lab work in: 2 weeks You need to have labs done when you are fasting.  You can come Monday through Friday 8:30 am to 12:00 pm and 1:15 to 4:30. You do not need to make an appointment as the order has already been placed. The labs you are going to have done are BMET, MGM.    Testing/Procedures: None Ordered   Follow-Up: At [facility], you and your health needs are our priority.  As part of our continuing mission to provide you with exceptional heart care, we have created designated Provider Care Teams.  These Care Teams include your primary Cardiologist (physician) and Advanced Practice Providers (APPs -  Physician Assistants and Nurse Practitioners) who all work together to provide you with the care you need, when you need it.  We recommend signing up for the patient portal called "MyChart".  Sign up information is provided on this After Visit Summary.  MyChart is used to connect with patients for Virtual Visits (Telemedicine).  Patients are able to view lab/test results, encounter notes, upcoming appointments, etc.  Non-urgent messages can be sent to your provider as well.   To learn more about what you can do with MyChart, go to [email or web address].    Your next appointment:   5 month(s)  The format for your next appointment:   In Person  Provider:   Gypsy Balsam, MD    Other Instructions NA

## 2022-07-22 NOTE — Progress Notes (Signed)
Cardiology Office Note:    Date:  07/22/2022   ID:  MARGHERITA HOSP, DOB Jul 04, 1927, MRN [id number]  PCP:  Leane Call, PA-C  Cardiologist:  Gypsy Balsam, MD    Referring MD: Leane Call, PA-C   Chief Complaint  Patient presents with   Follow-up    History of Present Illness:    Annette Huang is a 89 y.o. female  with past medical history significant for aortic stenosis which was assessed as moderate to severe couple years ago but recently she was in the hospital when she got echocardiogram done aortic stenosis has been described as mild with mean gradient of only 15. Additional problem include diastolic congestive heart failure, COPD she is oxygen dependent, late effect of CVA, essential hypertension. Recently she ended being in [facility] hospital because of pneumonia. She was also managed for congestive heart failure. She was diuresed few pounds comes today for follow-up.  She comes today to months for follow-up overall she seems to be doing well.  Use oxygen long time denies have some fatigue tiredness shortness of breath but no swelling of lower extremities weight is stable.  Complain of having cough  Past Medical History:  Diagnosis Date   Aortic stenosis 11/09/2014   Overview:  Mild in 2015 Moderate to severe in the October 2018   At high risk for injury related to fall 10/26/2021   Chronic respiratory failure with hypoxia (HCC) 08/06/2018   02 hs x 2lpm as of 08/05/2018 and prn daytime to keep sats > 90%  - 09/01/2018 no desats walking / limited by back    COPD (chronic obstructive pulmonary disease) (HCC)    Coronary artery disease    CVA (cerebral vascular accident) (HCC)    Diastolic congestive heart failure (HCC) 07/31/2018   DOE (dyspnea on exertion) 12/11/2016   Quit smoking 07/22/18 with emphysema on ct from 06/12/2018 - PFT 02/17/17    FEV1 1.35 [118%], ratio 0.82 with 14% resp to saba and TLC 93%, DLCO 73% - Echo 01/20/2018 c/w mod AS s PH - pt stopped  vasotec and anoro on her own prior to pulmonary ov 08/05/2018 > rec leave off and rx for gerd x 4 weeks > improved as of 09/01/2018  - 09/01/2018   Walked RA x one lap =  approx 250 ft avg pace >> stopped due to   Essential hypertension 08/18/2013   rec Off acei permanently as of 08/05/2018 due to pseudowheeze    Fatigue 09/04/2021   Greater trochanteric bursitis of right hip 11/13/2017   Hyperlipemia 08/18/2013   Hyperlipidemia    Hypertension    Late effects of CVA (cerebrovascular accident) 11/09/2014   Lumbar degenerative disc disease 06/18/2013   Lumbar spondylosis 06/18/2013   Peripheral vascular disease, unspecified (HCC) status post right carotic endarterectomy 08/28/2018   Sacroiliac inflammation (HCC) 11/13/2017   Sacroiliitis (HCC) 11/13/2017   Smoking 05/23/2015   Spinal stenosis of lumbar region with neurogenic claudication 08/18/2013   Stroke (HCC) 09/08/2013   Supplemental oxygen dependent 10/26/2021   Thoracic aortic atherosclerosis (HCC)     Past Surgical History:  Procedure Laterality Date   CAROTID ENDARTERECTOMY Bilateral    NECK SURGERY      Current Medications: Current Meds  Medication Sig   albuterol (PROVENTIL) (2.5 MG/3ML) 0.083% nebulizer solution Take 3 mLs (2.5 mg total) by nebulization every 6 (six) hours as needed for wheezing or shortness of breath.   albuterol (VENTOLIN HFA) 108 (90 Base) MCG/ACT inhaler Inhale 1  puff into the lungs as needed for wheezing or shortness of breath.   ALPRAZolam (XANAX) 0.25 MG tablet Take 0.25 mg by mouth daily as needed for anxiety or sleep.   atorvastatin (LIPITOR) 40 MG tablet Take 1 tablet (40 mg total) by mouth daily.   benzonatate (TESSALON) 200 MG capsule Take 200 mg by mouth 3 (three) times daily as needed for cough.   budesonide (PULMICORT) 0.25 MG/2ML nebulizer solution Take 2 mLs (0.25 mg total) by nebulization 2 (two) times daily.   cefdinir (OMNICEF) 300 MG capsule Take 300 mg by mouth daily.   clopidogrel  (PLAVIX) 75 MG tablet Take 75 mg by mouth daily.    Cyanocobalamin (VITAMIN B12 PO) Take 1 tablet by mouth daily. Unknown strenght   famotidine (PEPCID) 20 MG tablet One after supper (Patient taking differently: Take 20 mg by mouth daily after supper. One after supper)   furosemide (LASIX) 40 MG tablet Take 1.5 tablets (60 mg total) by mouth daily.   ketoconazole (NIZORAL) 2 % cream Apply between toes 4 and 5 of right foot for 3 weeks for burning pain-  twice daily (Patient taking differently: Apply 1 Application topically 3 (three) times a week. Apply between toes 4 and 5 of right foot for 3 weeks for burning pain-  twice daily)   Lactobacillus Rhamnosus, GG, (CULTURELLE) CAPS Take 1 capsule by mouth daily.   levothyroxine (SYNTHROID, LEVOTHROID) 50 MCG tablet Take 1 tablet daily by mouth.   lidocaine (LIDODERM) 5 % Place 1 patch onto the skin as needed for pain.   magnesium oxide (MAG-OX) 400 (240 Mg) MG tablet Take 1 tablet by mouth 2 (two) times daily.   metoprolol tartrate (LOPRESSOR) 25 MG tablet TAKE 1 TABLET BY MOUTH 2 TIMES DAILY.   OXYGEN Inhale 2 L into the lungs as needed (Mainly at bedtime). 2lpm as needed   pantoprazole (PROTONIX) 40 MG tablet Take 40 mg by mouth daily.   potassium chloride (MICRO-K) 10 MEQ CR capsule Take 1 capsule (10 mEq total) by mouth daily.   sodium chloride HYPERTONIC 3 % nebulizer solution Take by nebulization 3 (three) times daily as needed for other. (Patient taking differently: Take 4 mLs by nebulization 3 (three) times daily as needed for other (Mucus plug).)   traMADol (ULTRAM) 50 MG tablet Take 50 mg by mouth as needed for moderate pain or severe pain.   traZODone (DESYREL) 50 MG tablet Take 50 mg by mouth as needed for sleep.   VITAMIN D, ERGOCALCIFEROL, PO Take 1,000 Units by mouth daily.   [DISCONTINUED] amLODipine (NORVASC) 2.5 MG tablet Take 1 tablet (2.5 mg total) by mouth daily.   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  Tobacco Use   Smoking status: Former    Packs/day: 1.00    Years: 35.00    Additional pack years: 0.00    Total pack years: 35.00    Types: Cigarettes    Quit  date: 2023    Years since quitting: 1.4   Smokeless tobacco: Never  Vaping Use   Vaping Use: Former  Substance and Sexual Activity   Alcohol use: No   Drug use: No   Sexual activity: Not on file  Other Topics Concern   Not on file  Social History Narrative   Not on file   Social Determinants of Health   Financial Resource Strain: Not on file  Food Insecurity: Not on file  Transportation Needs: Not on file  Physical Activity: Not on file  Stress: Not on file  Social Connections: Not on file     Family History: The patient's family history includes Heart disease in her father and mother; Hypertension in her mother. ROS:   Please see the history of present illness.    All 14 point review of systems negative except as described per history of present illness  EKGs/Labs/Other Studies Reviewed:      Recent Labs: 09/28/2021: Magnesium 1.8; NT-Pro BNP 674 06/24/2022: BUN 22; Creatinine, Ser 1.38; Potassium 4.0; Sodium 140  Recent Lipid Panel    Component Value Date/Time   CHOL 162 11/19/2021 1123   TRIG 90 11/19/2021 1123   HDL 61 11/19/2021 1123   CHOLHDL 2.7 11/19/2021 1123   LDLCALC 84 11/19/2021 1123    Physical Exam:    VS:  BP 122/60 (BP Location: Left Arm, Patient Position: Sitting)   Pulse 64   Ht 4\' 10"  (1.473  m)   Wt 140 lb 6.4 oz (63.7 kg)   SpO2 96%   BMI 29.34 kg/m     Wt Readings from Last 3 Encounters:  07/22/22 140 lb 6.4 oz (63.7 kg)  07/16/22 141 lb 6.4 oz (64.1 kg)  06/18/22 148 lb 12.8 oz (67.5 kg)     GEN:  Well nourished, well developed in no acute distress HEENT: Normal NECK: No JVD; No carotid bruits LYMPHATICS: No lymphadenopathy CARDIAC: RRR, no murmurs, no rubs, no gallops RESPIRATORY:  Clear to auscultation without rales, wheezing or rhonchi  ABDOMEN: Soft, non-tender, non-distended MUSCULOSKELETAL:  No edema; No deformity  SKIN: Warm and dry LOWER EXTREMITIES: no swelling NEUROLOGIC:  Alert and oriented x 3 PSYCHIATRIC:  Normal affect   ASSESSMENT:    1. Chronic diastolic congestive heart failure (HCC)   2. Thoracic aortic atherosclerosis (HCC)   3. Chronic respiratory failure with hypoxia (HCC)    PLAN:    In order of problems listed above:  Chronic diastolic congestive heart failure.  Seems to be compensated.  Weight is unchanged.  Will continue present management.  Her proBNP is only minimally elevated. Atherosclerosis advised on appropriate medications no evidence of reactivation of the problem right now. Chronic respiratory failure to be followed by antimedicine as well as pulmonary.  She been complaining of a cough.  She does have appoint with pulmonary 27th of this month. Dyslipidemia I did review K PN which show me LDL of 84 HDL 61 will continue present management   Medication Adjustments/Labs and Tests Ordered: Current medicines are reviewed at length with the patient today.  Concerns regarding medicines are outlined above.  No orders of the defined types were placed in this encounter.  Medication changes: No orders of the defined types were placed in this encounter.   Signed, Georgeanna Lea, MD, [facility] 07/22/2022 10:55 AM    [facility]

## 2022-08-01 ENCOUNTER — Ambulatory Visit (INDEPENDENT_AMBULATORY_CARE_PROVIDER_SITE_OTHER): Payer: Medicare HMO

## 2022-08-01 ENCOUNTER — Encounter: Payer: Self-pay | Admitting: Adult Health

## 2022-08-01 ENCOUNTER — Ambulatory Visit: Payer: Medicare HMO | Admitting: Adult Health

## 2022-08-01 VITALS — BP 108/50 | HR 77 | Temp 97.9°F | Ht <= 58 in | Wt 141.4 lb

## 2022-08-01 DIAGNOSIS — J9611 Chronic respiratory failure with hypoxia: Secondary | ICD-10-CM

## 2022-08-01 DIAGNOSIS — J449 Chronic obstructive pulmonary disease, unspecified: Secondary | ICD-10-CM | POA: Diagnosis not present

## 2022-08-01 DIAGNOSIS — J9612 Chronic respiratory failure with hypercapnia: Secondary | ICD-10-CM

## 2022-08-01 DIAGNOSIS — J471 Bronchiectasis with (acute) exacerbation: Secondary | ICD-10-CM | POA: Diagnosis not present

## 2022-08-01 NOTE — Assessment & Plan Note (Signed)
Continue on oxygen to maintain O2 saturations greater than 88 to 90%. 

## 2022-08-01 NOTE — Assessment & Plan Note (Addendum)
Recent COPD exacerbation with pneumonia now improving.  Chest x-ray shows further improvement with resolving pneumonia Continue on mucociliary clearance.  Try to simplify nebulizer regimen with Pulmicort and albuterol nebulizer twice daily followed by saline nebs twice daily. Then Flutter valve .   If she needs an extra albuterol nebulizer treatment she can use this.   For now hold off on vest therapy as this was not affordable.  Plan  Patient Instructions  Chest xray today.  Continue on Pulmicort and Albuterol Neb Twice daily  Followed by Saline neb Twice daily   Flutter valve Three times a day   May use Extra Albuterol neb As needed   Mucinex As needed   Continue on Oxygen 2l/m Activity as tolerated.  Follow up in 6 weeks with chest xray and As needed

## 2022-08-01 NOTE — Patient Instructions (Addendum)
Chest xray today.  Continue on Pulmicort and Albuterol Neb Twice daily  Followed by Saline neb Twice daily   Flutter valve Three times a day   May use Extra Albuterol neb As needed   Mucinex As needed   Continue on Oxygen 2l/m Activity as tolerated.  Follow up in 6 weeks with chest xray and As needed

## 2022-08-01 NOTE — Progress Notes (Signed)
@Patient  ID: Annette Huang, female    DOB: 29-Oct-1927, 89 y.o.   MRN: [id number]  Chief Complaint  Patient presents with   Follow-up    Referring provider: Leane Call, PA-C  HPI: 89 year old female followed for COPD and chronic respiratory failure oxygen dependent Medical history significant for aortic valve stenosis and diastolic heart failure  TEST/EVENTS :  Spirometry 07/13/2021   FEV1 0.8 (76%)  Ratio 0.62    PFT 02/17/17    FEV1 1.35 [118%], ratio 0.82 with 14% resp to saba and TLC 93%, DLCO 73% - Echo 01/20/2018 c/w mod AS s PH    Echo  04/11/21   1. Left ventricular ejection fraction, by estimation, is 60 to 65%. The  left ventricle has normal function. The left ventricle has no regional  wall motion abnormalities. Left ventricular diastolic parameters are  consistent with Grade II  diastolic dysfunction   2. Right ventricular systolic function is normal. The right ventricular  size is normal. There is mildly elevated pulmonary artery systolic   .08/01/2022 Follow up ; COPD , O2 RF  Patient returns for a 2-week follow-up.  Patient was seen last visit for COPD exacerbation.  Had been hospitalized in May for pneumonia and COPD flare. The patient was changed from DuoNeb to albuterol nebulizer.  Saline nebs were added to help with mucociliary clearance.  Vest therapy was ordered. Was unable to afford VEST . Was going to cost her $1,700. She remains on Pulmicort nebulizer twice daily.  Patient says she is doing better starting to feel better with decreased cough and congestion.  Currently doing Pulmicort twice daily, albuterol 3-4 times daily along with saline nebulizer 3 times daily.  Patient says she spends all day DuoNeb treatments.  She remains on oxygen 2 L.  Appetite is good with no nausea vomiting or diarrhea.   Allergies  Allergen Reactions   Codeine Hives and Nausea Only    Immunization History  Administered Date(s) Administered   Influenza Split 12/09/2016    Influenza-Unspecified 01/04/2013, 10/05/2013, 11/04/2017    Past Medical History:  Diagnosis Date   Aortic stenosis 11/09/2014   Overview:  Mild in 2015 Moderate to severe in the October 2018   At high risk for injury related to fall 10/26/2021   Chronic respiratory failure with hypoxia (HCC) 08/06/2018   02 hs x 2lpm as of 08/05/2018 and prn daytime to keep sats > 90%  - 09/01/2018 no desats walking / limited by back    COPD (chronic obstructive pulmonary disease) (HCC)    Coronary artery disease    CVA (cerebral vascular accident) (HCC)    Diastolic congestive heart failure (HCC) 07/31/2018   DOE (dyspnea on exertion) 12/11/2016   Quit smoking 07/22/18 with emphysema on ct from 06/12/2018 - PFT 02/17/17    FEV1 1.35 [118%], ratio 0.82 with 14% resp to saba and TLC 93%, DLCO 73% - Echo 01/20/2018 c/w mod AS s PH - pt stopped vasotec and anoro on her own prior to pulmonary ov 08/05/2018 > rec leave off and rx for gerd x 4 weeks > improved as of 09/01/2018  - 09/01/2018   Walked RA x one lap =  approx 250 ft avg pace >> stopped due to   Essential hypertension 08/18/2013   rec Off acei permanently as of 08/05/2018 due to pseudowheeze    Fatigue 09/04/2021   Greater trochanteric bursitis of right hip 11/13/2017   Hyperlipemia 08/18/2013   Hyperlipidemia    Hypertension  Late effects of CVA (cerebrovascular accident) 11/09/2014   Lumbar degenerative disc disease 06/18/2013   Lumbar spondylosis 06/18/2013   Peripheral vascular disease, unspecified (HCC) status post right carotic endarterectomy 08/28/2018   Sacroiliac inflammation (HCC) 11/13/2017   Sacroiliitis (HCC) 11/13/2017   Smoking 05/23/2015   Spinal stenosis of lumbar region with neurogenic claudication 08/18/2013   Stroke (HCC) 09/08/2013   Supplemental oxygen dependent 10/26/2021   Thoracic aortic atherosclerosis (HCC)     Tobacco History: Social History   Tobacco Use  Smoking Status Former   Packs/day: 1.00   Years: 35.00    Additional pack years: 0.00   Total pack years: 35.00   Types: Cigarettes   Quit date: 2023   Years since quitting: 1.4  Smokeless Tobacco Never   Counseling given: Not Answered   Outpatient Medications Prior to Visit  Medication Sig Dispense Refill   albuterol (PROVENTIL) (2.5 MG/3ML) 0.083% nebulizer solution Take 3 mLs (2.5 mg total) by nebulization every 6 (six) hours as needed for wheezing or shortness of breath. 75 mL 12   albuterol (VENTOLIN HFA) 108 (90 Base) MCG/ACT inhaler Inhale 1 puff into the lungs as needed for wheezing or shortness of breath. 3 each 3   ALPRAZolam (XANAX) 0.25 MG tablet Take 0.25 mg by mouth daily as needed for anxiety or sleep.     atorvastatin (LIPITOR) 40 MG tablet Take 1 tablet (40 mg total) by mouth daily. 90 tablet 2   budesonide (PULMICORT) 0.25 MG/2ML nebulizer solution Take 2 mLs (0.25 mg total) by nebulization 2 (two) times daily. 360 mL 3   clopidogrel (PLAVIX) 75 MG tablet Take 75 mg by mouth daily.      Cyanocobalamin (VITAMIN B12 PO) Take 1 tablet by mouth daily. Unknown strenght     famotidine (PEPCID) 20 MG tablet One after supper (Patient taking differently: Take 20 mg by mouth daily after supper. One after supper) 30 tablet 11   furosemide (LASIX) 40 MG tablet Take 1.5 tablets (60 mg total) by mouth daily. 90 tablet 3   ketoconazole (NIZORAL) 2 % cream Apply between toes 4 and 5 of right foot for 3 weeks for burning pain-  twice daily (Patient taking differently: Apply 1 Application topically 3 (three) times a week. Apply between toes 4 and 5 of right foot for 3 weeks for burning pain-  twice daily) 60 g 2   Lactobacillus Rhamnosus, GG, (CULTURELLE) CAPS Take 1 capsule by mouth daily.     levothyroxine (SYNTHROID, LEVOTHROID) 50 MCG tablet Take 1 tablet daily by mouth.     lidocaine (LIDODERM) 5 % Place 1 patch onto the skin as needed for pain.     magnesium oxide (MAG-OX) 400 (240 Mg) MG tablet Take 1 tablet by mouth 3 (three) times daily.      metoprolol tartrate (LOPRESSOR) 25 MG tablet TAKE 1 TABLET BY MOUTH 2 TIMES DAILY. 180 tablet 2   OXYGEN Inhale 2 L into the lungs as needed (Mainly at bedtime). 2lpm as needed     pantoprazole (PROTONIX) 40 MG tablet Take 40 mg by mouth daily.     potassium chloride (MICRO-K) 10 MEQ CR capsule Take 1 capsule (10 mEq total) by mouth daily. 90 capsule 2   sodium chloride HYPERTONIC 3 % nebulizer solution Take by nebulization 3 (three) times daily as needed for other. (Patient taking differently: Take 4 mLs by nebulization 3 (three) times daily as needed for other (Mucus plug).) 750 mL 12   traMADol (ULTRAM) 50 MG tablet  Take 50 mg by mouth as needed for moderate pain or severe pain.     traZODone (DESYREL) 50 MG tablet Take 50 mg by mouth as needed for sleep.     VITAMIN D, ERGOCALCIFEROL, PO Take 1,000 Units by mouth daily.     benzonatate (TESSALON) 200 MG capsule Take 200 mg by mouth 3 (three) times daily as needed for cough. (Patient not taking: Reported on 08/01/2022)     cefdinir (OMNICEF) 300 MG capsule Take 300 mg by mouth daily. (Patient not taking: Reported on 08/01/2022)     No facility-administered medications prior to visit.     Review of Systems:   Constitutional:   No  weight loss, night sweats,  Fevers, chills, + fatigue, or  lassitude.  HEENT:   No headaches,  Difficulty swallowing,  Tooth/dental problems, or  Sore throat,                No sneezing, itching, ear ache, nasal congestion, post nasal drip,   CV:  No chest pain,  Orthopnea, PND, swelling in lower extremities, anasarca, dizziness, palpitations, syncope.   GI  No heartburn, indigestion, abdominal pain, nausea, vomiting, diarrhea, change in bowel habits, loss of appetite, bloody stools.   Resp:   No chest wall deformity  Skin: no rash or lesions.  GU: no dysuria, change in color of urine, no urgency or frequency.  No flank pain, no hematuria   MS:  No joint pain or swelling.  No decreased range of motion.   No back pain.    Physical Exam  BP (!) 108/50 (BP Location: Left Arm, Patient Position: Sitting, Cuff Size: Normal)   Pulse 77   Temp 97.9 F (36.6 C) (Oral)   Ht 4\' 10"  (1.473 m)   Wt 141 lb 6.4 oz (64.1 kg)   SpO2 100%   BMI 29.55 kg/m   GEN: A/Ox3; pleasant , NAD, elderly on O2    HEENT:  [place]/AT,  EACs-clear, TMs-wnl, NOSE-clear, THROAT-clear, no lesions, no postnasal drip or exudate noted.   NECK:  Supple w/ fair ROM; no JVD; normal carotid impulses w/o bruits; no thyromegaly or nodules palpated; no lymphadenopathy.    RESP  Coarse BS . no accessory muscle use, no dullness to percussion  CARD:  RRR, no m/r/g, tr  peripheral edema, pulses intact, no cyanosis or clubbing.  GI:   Soft & nt; nml bowel sounds; no organomegaly or masses detected.   Musco: Warm bil, no deformities or joint swelling noted.   Neuro: alert, no focal deficits noted.    Skin: Warm, no lesions or rashes    Lab Results:  CBC No results found for: "WBC", "RBC", "HGB", "HCT", "PLT", "MCV", "MCH", "MCHC", "RDW", "LYMPHSABS", "MONOABS", "EOSABS", "BASOSABS"  BMET   BNP    Imaging: DG Chest 2 View  Result Date: 08/01/2022 CLINICAL DATA:  Pneumonia. EXAM: CHEST - 2 VIEW COMPARISON:  Chest radiograph 07/09/2022, 06/11/2022, 02/19/2022 FINDINGS: Improved aeration of the right lower lung with resolution of the prior middle lobe opacity previously suggesting pneumonia. Resolution of the prior right pleural effusion. Mild bilateral mid and lower lung chronic interstitial thickening, likely scarring. Chronic minimal blunting of the left costophrenic angle may represent pleural scarring. Cardiac silhouette is at the upper limits of normal. Moderate to high-grade atherosclerotic calcifications within the aortic arch. No pneumothorax. No acute skeletal abnormality. IMPRESSION: Improved aeration of the right lower lung with resolution of the prior middle lobe opacity previously suggesting pneumonia.  Resolution of the prior  right pleural effusion. No evidence of residual pneumonia. Electronically Signed   By: Neita Garnet M.D.   On: 08/01/2022 15:06         Latest Ref Rng & Units 02/17/2017   12:50 PM  PFT Results  FVC-Pre L 1.50   FVC-Predicted Pre % 94   FVC-Post L 1.64   FVC-Predicted Post % 103   Pre FEV1/FVC % % 79   Post FEV1/FCV % % 82   FEV1-Pre L 1.18   FEV1-Predicted Pre % 103   FEV1-Post L 1.35   DLCO uncorrected ml/min/mmHg 11.64   DLCO UNC% % 71   DLCO corrected ml/min/mmHg 11.94   DLCO COR %Predicted % 73   DLVA Predicted % 88   TLC L 3.90   TLC % Predicted % 93   RV % Predicted % 94     No results found for: "NITRICOXIDE"      Assessment & Plan:   COPD GOLD 2  Recent COPD exacerbation with pneumonia now improving.  Chest x-ray shows further improvement with resolving pneumonia Continue on mucociliary clearance.  Try to simplify nebulizer regimen with Pulmicort and albuterol nebulizer twice daily followed by saline nebs twice daily. Then Flutter valve .   If she needs an extra albuterol nebulizer treatment she can use this.   For now hold off on vest therapy as this was not affordable.  Plan  Patient Instructions  Chest xray today.  Continue on Pulmicort and Albuterol Neb Twice daily  Followed by Saline neb Twice daily   Flutter valve Three times a day   May use Extra Albuterol neb As needed   Mucinex As needed   Continue on Oxygen 2l/m Activity as tolerated.  Follow up in 6 weeks with chest xray and As needed         Chronic respiratory failure with hypoxia and hypercapnia (HCC) Continue on oxygen to maintain O2 saturations greater than 88 to 90%     Rubye Oaks, NP 08/01/2022

## 2022-08-03 LAB — BASIC METABOLIC PANEL
BUN/Creatinine Ratio: 19 (ref 12–28)
BUN: 30 mg/dL (ref 10–36)
CO2: 27 mmol/L (ref 20–29)
Calcium: 9.8 mg/dL (ref 8.7–10.3)
Chloride: 102 mmol/L (ref 96–106)
Creatinine, Ser: 1.62 mg/dL — ABNORMAL HIGH (ref 0.57–1.00)
Glucose: 108 mg/dL — ABNORMAL HIGH (ref 70–99)
Potassium: 4.3 mmol/L (ref 3.5–5.2)
Sodium: 142 mmol/L (ref 134–144)
eGFR: 30 mL/min/{1.73_m2} — ABNORMAL LOW (ref >59)

## 2022-08-03 LAB — MAGNESIUM: Magnesium: 1.7 mg/dL (ref 1.6–2.3)

## 2022-09-10 ENCOUNTER — Other Ambulatory Visit: Payer: Self-pay | Admitting: *Deleted

## 2022-09-10 DIAGNOSIS — R059 Cough, unspecified: Secondary | ICD-10-CM

## 2022-09-12 ENCOUNTER — Ambulatory Visit: Payer: Medicare HMO | Admitting: Adult Health

## 2022-10-14 ENCOUNTER — Other Ambulatory Visit: Payer: Self-pay | Admitting: Cardiology

## 2022-10-15 ENCOUNTER — Ambulatory Visit: Payer: Medicare HMO | Admitting: Adult Health

## 2022-10-15 ENCOUNTER — Ambulatory Visit (INDEPENDENT_AMBULATORY_CARE_PROVIDER_SITE_OTHER): Payer: Medicare HMO

## 2022-10-15 ENCOUNTER — Encounter: Payer: Self-pay | Admitting: Adult Health

## 2022-10-15 VITALS — BP 128/64 | HR 78 | Temp 98.1°F | Ht <= 58 in | Wt 147.8 lb

## 2022-10-15 DIAGNOSIS — R059 Cough, unspecified: Secondary | ICD-10-CM | POA: Diagnosis not present

## 2022-10-15 DIAGNOSIS — J189 Pneumonia, unspecified organism: Secondary | ICD-10-CM | POA: Insufficient documentation

## 2022-10-15 DIAGNOSIS — J9611 Chronic respiratory failure with hypoxia: Secondary | ICD-10-CM | POA: Diagnosis not present

## 2022-10-15 DIAGNOSIS — J449 Chronic obstructive pulmonary disease, unspecified: Secondary | ICD-10-CM

## 2022-10-15 HISTORY — DX: Pneumonia, unspecified organism: J18.9

## 2022-10-15 NOTE — Assessment & Plan Note (Signed)
Moderate COPD currently stable on present regimen.  Plan  Patient Instructions  Continue on Pulmicort and Albuterol Neb Twice daily  Followed by Saline neb Twice daily   Flutter valve Three times a day   May use Extra Albuterol neb As needed   Mucinex As needed   Continue on Oxygen 2l/m Activity as tolerated.  Follow up with Dr. Sherene Sires in 3 month and As needed

## 2022-10-15 NOTE — Assessment & Plan Note (Signed)
Hospitalization with right sided pneumonia in May 2024.  Clinically improved.  Chest x-ray showed serial improvement.  Chest x-ray today is pending.  Plan  Patient Instructions  Continue on Pulmicort and Albuterol Neb Twice daily  Followed by Saline neb Twice daily   Flutter valve Three times a day   May use Extra Albuterol neb As needed   Mucinex As needed   Continue on Oxygen 2l/m Activity as tolerated.  Follow up with Dr. Sherene Sires in 3 month and As needed

## 2022-10-15 NOTE — Assessment & Plan Note (Signed)
Continue on oxygen to maintain O2 saturations greater than 88 to 9%.  Appears stable

## 2022-10-15 NOTE — Patient Instructions (Addendum)
Continue on Pulmicort and Albuterol Neb Twice daily  Followed by Saline neb Twice daily   Flutter valve Three times a day   May use Extra Albuterol neb As needed   Mucinex As needed   Continue on Oxygen 2l/m Activity as tolerated.  Follow up with Dr. Sherene Sires in 3 month and As needed

## 2022-10-15 NOTE — Progress Notes (Signed)
@Patient  ID: Annette Huang, female    DOB: [birth year], 89 y.o.   MRN: [id number]  Chief Complaint  Patient presents with   Follow-up    Referring provider: Leane Call, PA-C  HPI: 89 year old female followed for COPD and chronic respiratory failure on home oxygen Medical history significant for aortic valve stenosis and diastolic congestive heart failure  TEST/EVENTS :  Spirometry 07/13/2021   FEV1 0.8 (76%)  Ratio 0.62    PFT 02/17/17    FEV1 1.35 [118%], ratio 0.82 with 14% resp to saba and TLC 93%, DLCO 73% - Echo 01/20/2018 c/w mod AS s PH    Echo  04/11/21   1. Left ventricular ejection fraction, by estimation, is 60 to 65%. The  left ventricle has normal function. The left ventricle has no regional  wall motion abnormalities. Left ventricular diastolic parameters are  consistent with Grade II  diastolic dysfunction   2. Right ventricular systolic function is normal. The right ventricular  size is normal. There is mildly elevated pulmonary artery systolic     10/15/2022 Follow up : COPD, O2 RF , Pnuemonia  Patient returns for a 11-month follow-up.  Patient is followed for moderate COPD and chronic respiratory failure on oxygen.  Patient is on Pulmicort nebulizer and albuterol nebulizer twice daily.  She has saline nebs that she uses twice daily.  She also uses a flutter valve to help with mucus management.  She did have a hospitalization  earlier this summer in May with a pneumonia.  She was treated with empiric antibiotics.  Patient says she is feeling better.  She has minimum cough.  No hemoptysis or fever.  She says she is back to baseline maybe does not have all of her energy back yet but does feel better.  Previous chest x-ray and CT scan shows significant right lower lobe and right middle lobe consolidation.  Follow-up chest x-ray on August 01, 2022 showed significant improvement with improved aeration the right lower lobe and resolution of the right middle lobe opacity.   Also right pleural effusion resolved.  Chest x-ray today is pending she says her appetite is good with No nausea vomiting or diarrhea. She remains on oxygen at 2 L.  No increased oxygen demands. Declines flu shot.   Allergies  Allergen Reactions   Codeine Hives and Nausea Only    Immunization History  Administered Date(s) Administered   Influenza Split 12/09/2016   Influenza-Unspecified 01/04/2013, 10/05/2013, 11/04/2017    Past Medical History:  Diagnosis Date   Aortic stenosis 11/09/2014   Overview:  Mild in 2015 Moderate to severe in the October 2018   At high risk for injury related to fall 10/26/2021   Chronic respiratory failure with hypoxia (HCC) 08/06/2018   02 hs x 2lpm as of 08/05/2018 and prn daytime to keep sats > 90%  - 09/01/2018 no desats walking / limited by back    COPD (chronic obstructive pulmonary disease) (HCC)    Coronary artery disease    CVA (cerebral vascular accident) (HCC)    Diastolic congestive heart failure (HCC) 07/31/2018   DOE (dyspnea on exertion) 12/11/2016   Quit smoking 07/22/18 with emphysema on ct from 06/12/2018 - PFT 02/17/17    FEV1 1.35 [118%], ratio 0.82 with 14% resp to saba and TLC 93%, DLCO 73% - Echo 01/20/2018 c/w mod AS s PH - pt stopped vasotec and anoro on her own prior to pulmonary ov 08/05/2018 > rec leave off and rx for gerd x  4 weeks > improved as of 09/01/2018  - 09/01/2018   Walked RA x one lap =  approx 250 ft avg pace >> stopped due to   Essential hypertension 08/18/2013   rec Off acei permanently as of 08/05/2018 due to pseudowheeze    Fatigue 09/04/2021   Greater trochanteric bursitis of right hip 11/13/2017   Hyperlipemia 08/18/2013   Hyperlipidemia    Hypertension    Late effects of CVA (cerebrovascular accident) 11/09/2014   Lumbar degenerative disc disease 06/18/2013   Lumbar spondylosis 06/18/2013   Peripheral vascular disease, unspecified (HCC) status post right carotic endarterectomy 08/28/2018   Sacroiliac inflammation  (HCC) 11/13/2017   Sacroiliitis (HCC) 11/13/2017   Smoking 05/23/2015   Spinal stenosis of lumbar region with neurogenic claudication 08/18/2013   Stroke (HCC) 09/08/2013   Supplemental oxygen dependent 10/26/2021   Thoracic aortic atherosclerosis (HCC)     Tobacco History: Social History   Tobacco Use  Smoking Status Former   Current packs/day: 0.00   Average packs/day: 1 pack/day for 35.0 years (35.0 ttl pk-yrs)   Types: Cigarettes   Start date: 53   Quit date: 2023   Years since quitting: 1.6  Smokeless Tobacco Never   Counseling given: Not Answered   Outpatient Medications Prior to Visit  Medication Sig Dispense Refill   albuterol (PROVENTIL) (2.5 MG/3ML) 0.083% nebulizer solution Take 3 mLs (2.5 mg total) by nebulization every 6 (six) hours as needed for wheezing or shortness of breath. 75 mL 12   albuterol (VENTOLIN HFA) 108 (90 Base) MCG/ACT inhaler Inhale 1 puff into the lungs as needed for wheezing or shortness of breath. 3 each 3   ALPRAZolam (XANAX) 0.25 MG tablet Take 0.25 mg by mouth daily as needed for anxiety or sleep.     atorvastatin (LIPITOR) 40 MG tablet Take 1 tablet (40 mg total) by mouth daily. 90 tablet 2   benzonatate (TESSALON) 200 MG capsule Take 200 mg by mouth 3 (three) times daily as needed for cough. (Patient not taking: Reported on 08/01/2022)     budesonide (PULMICORT) 0.25 MG/2ML nebulizer solution Take 2 mLs (0.25 mg total) by nebulization 2 (two) times daily. 360 mL 3   clopidogrel (PLAVIX) 75 MG tablet Take 75 mg by mouth daily.      Cyanocobalamin (VITAMIN B12 PO) Take 1 tablet by mouth daily. Unknown strenght     famotidine (PEPCID) 20 MG tablet One after supper (Patient taking differently: Take 20 mg by mouth daily after supper. One after supper) 30 tablet 11   furosemide (LASIX) 40 MG tablet Take 1.5 tablets (60 mg total) by mouth daily. 90 tablet 3   ketoconazole (NIZORAL) 2 % cream Apply between toes 4 and 5 of right foot for 3 weeks for  burning pain-  twice daily (Patient taking differently: Apply 1 Application topically 3 (three) times a week. Apply between toes 4 and 5 of right foot for 3 weeks for burning pain-  twice daily) 60 g 2   Lactobacillus Rhamnosus, GG, (CULTURELLE) CAPS Take 1 capsule by mouth daily.     levothyroxine (SYNTHROID, LEVOTHROID) 50 MCG tablet Take 1 tablet daily by mouth.     lidocaine (LIDODERM) 5 % Place 1 patch onto the skin as needed for pain.     magnesium oxide (MAG-OX) 400 (240 Mg) MG tablet Take 1 tablet by mouth 3 (three) times daily.     metoprolol tartrate (LOPRESSOR) 25 MG tablet TAKE 1 TABLET BY MOUTH 2 TIMES DAILY. 180 tablet 1  OXYGEN Inhale 2 L into the lungs as needed (Mainly at bedtime). 2lpm as needed     pantoprazole (PROTONIX) 40 MG tablet Take 40 mg by mouth daily.     potassium chloride (MICRO-K) 10 MEQ CR capsule TAKE 1 CAPSULE BY MOUTH DAILY. 90 capsule 1   sodium chloride HYPERTONIC 3 % nebulizer solution Take by nebulization 3 (three) times daily as needed for other. (Patient taking differently: Take 4 mLs by nebulization 3 (three) times daily as needed for other (Mucus plug).) 750 mL 12   traMADol (ULTRAM) 50 MG tablet Take 50 mg by mouth as needed for moderate pain or severe pain.     traZODone (DESYREL) 50 MG tablet Take 50 mg by mouth as needed for sleep.     VITAMIN D, ERGOCALCIFEROL, PO Take 1,000 Units by mouth daily.     No facility-administered medications prior to visit.     Review of Systems:   Constitutional:   No  weight loss, night sweats,  Fevers, chills,  +fatigue, or  lassitude.  HEENT:   No headaches,  Difficulty swallowing,  Tooth/dental problems, or  Sore throat,                No sneezing, itching, ear ache, nasal congestion, post nasal drip,   CV:  No chest pain,  Orthopnea, PND, swelling in lower extremities, anasarca, dizziness, palpitations, syncope.   GI  No heartburn, indigestion, abdominal pain, nausea, vomiting, diarrhea, change in bowel  habits, loss of appetite, bloody stools.   Resp: .  No chest wall deformity  Skin: no rash or lesions.  GU: no dysuria, change in color of urine, no urgency or frequency.  No flank pain, no hematuria   MS:  No joint pain or swelling.  No decreased range of motion.  No back pain.    Physical Exam  BP 128/64 (BP Location: Left Arm, Cuff Size: Normal)   Pulse 78   Temp 98.1 F (36.7 C) (Oral)   Ht 4\' 10"  (1.473 m)   Wt 147 lb 12.8 oz (67 kg)   SpO2 99%   BMI 30.89 kg/m   GEN: A/Ox3; pleasant , NAD, well nourished    HEENT:  [place]/AT,  NOSE-clear, THROAT-clear, no lesions, no postnasal drip or exudate noted.   NECK:  Supple w/ fair ROM; no JVD; normal carotid impulses w/o bruits; no thyromegaly or nodules palpated; no lymphadenopathy.    RESP  Clear  P & A; w/o, wheezes/ rales/ or rhonchi. no accessory muscle use, no dullness to percussion  CARD:  RRR, Gr1-2 SM  , no peripheral edema, pulses intact, no cyanosis or clubbing.  GI:   Soft & nt; nml bowel sounds; no organomegaly or masses detected.   Musco: Warm bil, no deformities or joint swelling noted.   Neuro: alert, no focal deficits noted.    Skin: Warm, no lesions or rashes    Lab Results:  CBC      Imaging: No results found.  Administration History     None          Latest Ref Rng & Units 02/17/2017   12:50 PM  PFT Results  FVC-Pre L 1.50   FVC-Predicted Pre % 94   FVC-Post L 1.64   FVC-Predicted Post % 103   Pre FEV1/FVC % % 79   Post FEV1/FCV % % 82   FEV1-Pre L 1.18   FEV1-Predicted Pre % 103   FEV1-Post L 1.35   DLCO uncorrected ml/min/mmHg 11.64  DLCO UNC% % 71   DLCO corrected ml/min/mmHg 11.94   DLCO COR %Predicted % 73   DLVA Predicted % 88   TLC L 3.90   TLC % Predicted % 93   RV % Predicted % 94     No results found for: "NITRICOXIDE"      Assessment & Plan:   COPD GOLD 2  Moderate COPD currently stable on present regimen.  Plan  Patient Instructions  Continue  on Pulmicort and Albuterol Neb Twice daily  Followed by Saline neb Twice daily   Flutter valve Three times a day   May use Extra Albuterol neb As needed   Mucinex As needed   Continue on Oxygen 2l/m Activity as tolerated.  Follow up with Dr. Sherene Sires in 3 month and As needed         Chronic respiratory failure with hypoxia (HCC) Continue on oxygen to maintain O2 saturations greater than 88 to 9%.  Appears stable  CAP (community acquired pneumonia) Hospitalization with right sided pneumonia in May 2024.  Clinically improved.  Chest x-ray showed serial improvement.  Chest x-ray today is pending.  Plan  Patient Instructions  Continue on Pulmicort and Albuterol Neb Twice daily  Followed by Saline neb Twice daily   Flutter valve Three times a day   May use Extra Albuterol neb As needed   Mucinex As needed   Continue on Oxygen 2l/m Activity as tolerated.  Follow up with Dr. Sherene Sires in 3 month and As needed           Rubye Oaks, NP 10/15/2022

## 2022-10-23 NOTE — Telephone Encounter (Signed)
Results have been relayed to the patient/authorized caretaker. The patient/authorized caretaker verbalized understanding. No questions at this time.

## 2022-12-13 ENCOUNTER — Ambulatory Visit: Payer: Medicare HMO | Admitting: Podiatry

## 2022-12-19 ENCOUNTER — Ambulatory Visit: Payer: Medicare HMO | Admitting: Podiatry

## 2022-12-26 ENCOUNTER — Ambulatory Visit: Payer: Medicare HMO | Admitting: Podiatry

## 2023-01-21 ENCOUNTER — Ambulatory Visit: Payer: Medicare HMO | Admitting: Internal Medicine

## 2023-01-21 ENCOUNTER — Encounter: Payer: Self-pay | Admitting: Internal Medicine

## 2023-01-21 VITALS — BP 152/84 | HR 57 | Ht 59.0 in | Wt 150.4 lb

## 2023-01-21 DIAGNOSIS — J9610 Chronic respiratory failure, unspecified whether with hypoxia or hypercapnia: Secondary | ICD-10-CM | POA: Diagnosis not present

## 2023-01-21 DIAGNOSIS — J9611 Chronic respiratory failure with hypoxia: Secondary | ICD-10-CM

## 2023-01-21 DIAGNOSIS — J4489 Other specified chronic obstructive pulmonary disease: Secondary | ICD-10-CM

## 2023-01-21 DIAGNOSIS — J479 Bronchiectasis, uncomplicated: Secondary | ICD-10-CM | POA: Diagnosis not present

## 2023-01-21 NOTE — Progress Notes (Signed)
Annette Huang    [id number]    [birth year]  Primary Care Physician:Nodal, Joline Salt, PA-C Date of Appointment: 01/21/2023 Established Patient Visit  Chief complaint:   Chief Complaint  Patient presents with   Follow-up    Pt states she has been well. Every now and then she has trouble with her breathing. 3 L o2      HPI: Annette Huang is a 89 y.o. woman with shortness of breath. Last seen by Dr. Sherene Sires in Feb 2024. Saw TP the last two times. Spirometry June 2023 shows moderate airflow limitation FEV1 62% of predicted. COPD given history of tobacco use. Also on [facility]. Significant cardiac issues - chronic hfpef and moderate AS. History of CVA.  Interval Updates: Here for follow up.   Doing well on her pulmicort, albuterol and saline nebs each BID with flutter valve. Stable on 2LNC-2.5LNC.  No interval hospitalizations or ED visits.   Did not use vest due to cost.  Overall doing well. Still living independently. Daughter notes having some mild cognitive decline and memory issues which are being attributed to age.   Having some early morning cough and post nasal drainage.  I have reviewed the patient's family social and past medical history and updated as appropriate.   Past Medical History:  Diagnosis Date   Aortic stenosis 11/09/2014   Overview:  Mild in 2015 Moderate to severe in the October 2018   At high risk for injury related to fall 10/26/2021   Chronic respiratory failure with hypoxia (HCC) 08/06/2018   02 hs x 2lpm as of 08/05/2018 and prn daytime to keep sats > 90%  - 09/01/2018 no desats walking / limited by back    COPD (chronic obstructive pulmonary disease) (HCC)    Coronary artery disease    CVA (cerebral vascular accident) (HCC)    Diastolic congestive heart failure (HCC) 07/31/2018   DOE (dyspnea on exertion) 12/11/2016   Quit smoking 07/22/18 with emphysema on ct from 06/12/2018 - PFT 02/17/17    FEV1 1.35 [118%], ratio 0.82 with 14% resp to saba  and TLC 93%, DLCO 73% - Echo 01/20/2018 c/w mod AS s PH - pt stopped vasotec and anoro on her own prior to pulmonary ov 08/05/2018 > rec leave off and rx for gerd x 4 weeks > improved as of 09/01/2018  - 09/01/2018   Walked RA x one lap =  approx 250 ft avg pace >> stopped due to   Essential hypertension 08/18/2013   rec Off acei permanently as of 08/05/2018 due to pseudowheeze    Fatigue 09/04/2021   Greater trochanteric bursitis of right hip 11/13/2017   Hyperlipemia 08/18/2013   Hyperlipidemia    Hypertension    Late effects of CVA (cerebrovascular accident) 11/09/2014   Lumbar degenerative disc disease 06/18/2013   Lumbar spondylosis 06/18/2013   Peripheral vascular disease, unspecified (HCC) status post right carotic endarterectomy 08/28/2018   Sacroiliac inflammation (HCC) 11/13/2017   Sacroiliitis (HCC) 11/13/2017   Smoking 05/23/2015   Spinal stenosis of lumbar region with neurogenic claudication 08/18/2013   Stroke (HCC) 09/08/2013   Supplemental oxygen dependent 10/26/2021   Thoracic aortic atherosclerosis (HCC)     Past Surgical History:  Procedure Laterality Date   CAROTID ENDARTERECTOMY Bilateral    NECK SURGERY      Family History  Problem Relation Age of Onset   Heart disease Mother    Hypertension Mother    Heart disease Father  Social History   Occupational History   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  Gen:      No acute distress,  ambulating with rolling walker ENT:  nasal cannula Lungs:   kyphosis, ctab no wheeze CV:         RRR, systolic murmur best auscultated at LLSB Ext: no peripheral edema  Data Reviewed: Imaging: I have personally reviewed the chest xray Sept 2024 which shows well aerated lung no mucus plug.    PFTs:     Latest Ref Rng & Units 02/17/2017   12:50 PM  PFT Results  FVC-Pre L 1.50   FVC-Predicted Pre % 94   FVC-Post L 1.64   FVC-Predicted Post % 103   Pre FEV1/FVC % % 79   Post FEV1/FCV % % 82   FEV1-Pre L 1.18   FEV1-Predicted Pre % 103   FEV1-Post L 1.35   DLCO uncorrected ml/min/mmHg 11.64   DLCO UNC% % 71   DLCO corrected ml/min/mmHg 11.94   DLCO COR %Predicted % 73   DLVA Predicted % 88   TLC L 3.90   TLC % Predicted % 93   RV % Predicted % 94    I have personally reviewed the patient's PFTs and no airflow limitation in 2019  Labs:  Immunization status: Immunization History  Administered Date(s) Administered   Influenza Split 12/09/2016   Influenza-Unspecified 01/04/2013, 10/05/2013, 11/04/2017    External Records Personally Reviewed: pulmonary, hospital stay  Assessment:  COPD with bronchiectasis, controlled Chronic respiratory failure Moderate aortic stenosis Chronic rhinitis GERD  Plan/Recommendations:  Continue the pulmicort, saline, and albuterol nebulizer therapies twice daily with the flutter valve as you are doing.   If your oxygen levels get lower than 90% on your 2LNC, increase your nebulizer therapies to 4 times a day with flutter valve and please give [place] a call.   Continue PPI for GERD.   Call us sooner if any issues or concerns with the breathing.    Return to Care: Return in about 3 months (around 04/21/2023).   Durel Salts, MD Pulmonary and Critical Care Medicine [facility] [phone]

## 2023-01-21 NOTE — Patient Instructions (Signed)
It was a pleasure to see you today! Glad you are doing well.   Please schedule follow up scheduled with myself in 3 months.  If my schedule is not open yet, we will contact you with a reminder closer to that time. Please call [phone] if you haven't heard from [place] a month before, and always call us sooner if issues or concerns arise. You can also send [place] a message through MyChart, but but aware that this is not to be used for urgent issues and it may take up to 5-7 days to receive a reply. Please be aware that you will likely be able to view your results before I have a chance to respond to them. Please give [place] 5 business days to respond to any non-urgent results.    Before your next visit I would like you to have:  Continue the pulmicort, saline, and albuterol nebulizer therapies twice daily with the flutter valve as you are doing.   If your oxygen levels get lower than 90% on your 2LNC, increase your nebulizer therapies to 4 times a day with flutter valve and please give [place] a call.   Call us sooner if any issues or concerns with the breathing.

## 2023-01-23 ENCOUNTER — Ambulatory Visit: Payer: Medicare HMO | Admitting: Podiatry

## 2023-01-23 DIAGNOSIS — M79675 Pain in left toe(s): Secondary | ICD-10-CM | POA: Diagnosis not present

## 2023-01-23 DIAGNOSIS — L84 Corns and callosities: Secondary | ICD-10-CM | POA: Diagnosis not present

## 2023-01-23 DIAGNOSIS — B353 Tinea pedis: Secondary | ICD-10-CM

## 2023-01-23 DIAGNOSIS — M79674 Pain in right toe(s): Secondary | ICD-10-CM | POA: Diagnosis not present

## 2023-01-23 DIAGNOSIS — I739 Peripheral vascular disease, unspecified: Secondary | ICD-10-CM | POA: Diagnosis not present

## 2023-01-23 DIAGNOSIS — B351 Tinea unguium: Secondary | ICD-10-CM | POA: Diagnosis not present

## 2023-01-23 MED ORDER — KETOCONAZOLE 2 % EX CREA
1.0000 | TOPICAL_CREAM | Freq: Every day | CUTANEOUS | 2 refills | Status: AC
Start: 2023-01-23 — End: ?

## 2023-01-23 NOTE — Progress Notes (Signed)
Subjective:  Patient ID: Annette Huang, female    DOB: 02-03-1928,  MRN: [id number]  Annette Huang presents to clinic today for:  Chief Complaint  Patient presents with   RFC    RFC FOCUS CORN BETWEEN TOE PLAVIX   Patient notes nails are thick and elongated, causing pain in shoe gear when ambulating.  A family member is with her today.  They are requesting the fourth interspace be inspected for possible interdigital corn.  States that this gives her a lot of pain.  They are also requesting a refill for her ketoconazole cream for athlete's foot.  Patient is also noting some discomfort to the fifth metatarsal head on the right foot.  PCP is Nodal, Joline Salt, PA-C.  Last seen on 01/16/2023  Past Medical History:  Diagnosis Date   Aortic stenosis 11/09/2014   Overview:  Mild in 2015 Moderate to severe in the October 2018   At high risk for injury related to fall 10/26/2021   Chronic respiratory failure with hypoxia (HCC) 08/06/2018   02 hs x 2lpm as of 08/05/2018 and prn daytime to keep sats > 90%  - 09/01/2018 no desats walking / limited by back    COPD (chronic obstructive pulmonary disease) (HCC)    Coronary artery disease    CVA (cerebral vascular accident) (HCC)    Diastolic congestive heart failure (HCC) 07/31/2018   DOE (dyspnea on exertion) 12/11/2016   Quit smoking 07/22/18 with emphysema on ct from 06/12/2018 - PFT 02/17/17    FEV1 1.35 [118%], ratio 0.82 with 14% resp to saba and TLC 93%, DLCO 73% - Echo 01/20/2018 c/w mod AS s PH - pt stopped vasotec and anoro on her own prior to pulmonary ov 08/05/2018 > rec leave off and rx for gerd x 4 weeks > improved as of 09/01/2018  - 09/01/2018   Walked RA x one lap =  approx 250 ft avg pace >> stopped due to   Essential hypertension 08/18/2013   rec Off acei permanently as of 08/05/2018 due to pseudowheeze    Fatigue 09/04/2021   Greater trochanteric bursitis of right hip 11/13/2017   Hyperlipemia 08/18/2013   Hyperlipidemia     Hypertension    Late effects of CVA (cerebrovascular accident) 11/09/2014   Lumbar degenerative disc disease 06/18/2013   Lumbar spondylosis 06/18/2013   Peripheral vascular disease, unspecified (HCC) status post right carotic endarterectomy 08/28/2018   Sacroiliac inflammation (HCC) 11/13/2017   Sacroiliitis (HCC) 11/13/2017   Smoking 05/23/2015   Spinal stenosis of lumbar region with neurogenic claudication 08/18/2013   Stroke (HCC) 09/08/2013   Supplemental oxygen dependent 10/26/2021   Thoracic aortic atherosclerosis (HCC)     Allergies  Allergen Reactions   Codeine Hives and Nausea Only   Objective:  Annette Huang is a pleasant 89 y.o. female in NAD. AAO x 3.  Vascular Examination: Patient has palpable DP pulse, absent PT pulse bilateral.  Delayed capillary refill bilateral toes.  Sparse digital hair bilateral.  Proximal to distal cooling WNL bilateral.    Dermatological Examination: Interspaces are clear with no open lesions noted bilateral.  Skin is shiny and atrophic bilateral.  Nails are 3-24mm thick, with yellowish/brown discoloration, subungual debris and distal onycholysis x10.  There is pain with compression of nails x10.  There are hyperkeratotic lesions noted on the medial aspect of the right fifth toe DIPJ and lateral aspect of the right fourth toe PIPJ.  (Kissing corns) some dry, scaly skin noted to  plantar aspect of both feet.  Neurological Examination: Protective sensation intact b/l LE. Vibratory sensation intact b/l LE.  Musculoskeletal Examination: Pain on palpation plantar fifth met head.  No associated callus or corn present.  Patient qualifies for at-risk foot care because of PVD.  Assessment/Plan: 1. Pain due to onychomycosis of toenails of both feet   2. Corns   3. PVD (peripheral vascular disease) (HCC)   4. Tinea pedis of both feet     Meds ordered this encounter  Medications   ketoconazole (NIZORAL) 2 % cream    Sig: Apply 1 Application  topically daily. Apply 1gm to affected area of feet and rub in well.    Dispense:  60 g    Refill:  2   Mycotic nails x10 were sharply debrided with sterile nail nippers and power debriding burr to decrease bulk and length.  Hyperkeratotic lesions in the fourth interspace were shaved with #312 blade.  Prescription for ketoconazole 2% cream was renewed and sent to the patient's pharmacy on file.  She will apply this once daily to the plantar aspect of both feet.  An offloading pad was placed under the patient's shoe insole to offload the right fifth metatarsal head.  She noted immediate improvement upon weightbearing.  Return in about 3 months (around 04/23/2023) for RFC & R corn.   Clerance Lav, DPM, FACFAS [facility]     [address] [place], [place] [address]                Office [phone]  Fax [phone]

## 2023-01-26 NOTE — Addendum Note (Signed)
Addended by: Lucia Estelle D on: 01/26/2023 08:55 PM   Modules accepted: Level of Service

## 2023-01-28 ENCOUNTER — Ambulatory Visit: Payer: Medicare HMO | Admitting: Internal Medicine

## 2023-03-05 ENCOUNTER — Encounter: Payer: Self-pay | Admitting: Cardiology

## 2023-03-05 ENCOUNTER — Ambulatory Visit: Payer: Medicare HMO | Attending: Cardiology | Admitting: Cardiology

## 2023-03-05 VITALS — BP 120/66 | HR 8 | Ht <= 58 in | Wt 145.2 lb

## 2023-03-05 DIAGNOSIS — I350 Nonrheumatic aortic (valve) stenosis: Secondary | ICD-10-CM | POA: Diagnosis not present

## 2023-03-05 DIAGNOSIS — E785 Hyperlipidemia, unspecified: Secondary | ICD-10-CM | POA: Diagnosis not present

## 2023-03-05 DIAGNOSIS — I10 Essential (primary) hypertension: Secondary | ICD-10-CM

## 2023-03-05 DIAGNOSIS — I5032 Chronic diastolic (congestive) heart failure: Secondary | ICD-10-CM | POA: Diagnosis not present

## 2023-03-05 DIAGNOSIS — I6990 Unspecified sequelae of unspecified cerebrovascular disease: Secondary | ICD-10-CM

## 2023-03-05 NOTE — Patient Instructions (Addendum)
Medication Instructions:  Your physician recommends that you continue on your current medications as directed. Please refer to the Current Medication list given to you today.  *If you need a refill on your cardiac medications before your next appointment, please call your pharmacy*   Lab Work: None Ordered If you have labs (blood work) drawn today and your tests are completely normal, you will receive your results only by: MyChart Message (if you have MyChart) OR A paper copy in the mail If you have any lab test that is abnormal or we need to change your treatment, we will call you to review the results.   Testing/Procedures: May Your physician has requested that you have an echocardiogram. Echocardiography is a painless test that uses sound waves to create images of your heart. It provides your doctor with information about the size and shape of your heart and how well your heart's chambers and valves are working. This procedure takes approximately one hour. There are no restrictions for this procedure. Please do NOT wear cologne, perfume, aftershave, or lotions (deodorant is allowed). Please arrive 15 minutes prior to your appointment time.  Please note: We ask at that you not bring children with you during ultrasound (echo/ vascular) testing. Due to room size and safety concerns, children are not allowed in the ultrasound rooms during exams. Our front office staff cannot provide observation of children in our lobby area while testing is being conducted. An adult accompanying a patient to their appointment will only be allowed in the ultrasound room at the discretion of the ultrasound technician under special circumstances. We apologize for any inconvenience.    Follow-Up: At [facility], you and your health needs are our priority.  As part of our continuing mission to provide you with exceptional heart care, we have created designated Provider Care Teams.  These Care Teams include your  primary Cardiologist (physician) and Advanced Practice Providers (APPs -  Physician Assistants and Nurse Practitioners) who all work together to provide you with the care you need, when you need it.  We recommend signing up for the patient portal called "MyChart".  Sign up information is provided on this After Visit Summary.  MyChart is used to connect with patients for Virtual Visits (Telemedicine).  Patients are able to view lab/test results, encounter notes, upcoming appointments, etc.  Non-urgent messages can be sent to your provider as well.   To learn more about what you can do with MyChart, go to [email or web address].    Your next appointment:   6 month(s)  The format for your next appointment:   In Person  Provider:   Gypsy Balsam, MD    Other Instructions NA

## 2023-03-05 NOTE — Progress Notes (Signed)
Cardiology Office Note:    Date:  03/05/2023   ID:  Annette Huang, DOB December 17, 1927, MRN [id number]  PCP:  Leane Call, PA-C  Cardiologist:  Gypsy Balsam, MD    Referring MD: Leane Call, PA-C   Chief Complaint  Patient presents with   heart flutter    History of Present Illness:    Annette Huang is a 89 y.o. female past medical history significant for arctic stenosis which initially was assessed moderate to severe however last May she end up going to the hospital echocardiogram did not show gradient only 15 and severe of the stenosis was assessed only mild.  Additional problem include advanced COPD, likely she does not smoke anymore, history of CVA, essential hypertension. Comes today to months for follow-up overall seems to be doing well.  Denies have any chest pain tightness squeezing pressure burning chest does have some short palpitations but otherwise fine  Past Medical History:  Diagnosis Date   Aortic stenosis 11/09/2014   Overview:  Mild in 2015 Moderate to severe in the October 2018   At high risk for injury related to fall 10/26/2021   Chronic respiratory failure with hypoxia (HCC) 08/06/2018   02 hs x 2lpm as of 08/05/2018 and prn daytime to keep sats > 90%  - 09/01/2018 no desats walking / limited by back    COPD (chronic obstructive pulmonary disease) (HCC)    Coronary artery disease    CVA (cerebral vascular accident) (HCC)    Diastolic congestive heart failure (HCC) 07/31/2018   DOE (dyspnea on exertion) 12/11/2016   Quit smoking 07/22/18 with emphysema on ct from 06/12/2018 - PFT 02/17/17    FEV1 1.35 [118%], ratio 0.82 with 14% resp to saba and TLC 93%, DLCO 73% - Echo 01/20/2018 c/w mod AS s PH - pt stopped vasotec and anoro on her own prior to pulmonary ov 08/05/2018 > rec leave off and rx for gerd x 4 weeks > improved as of 09/01/2018  - 09/01/2018   Walked RA x one lap =  approx 250 ft avg pace >> stopped due to   Essential hypertension 08/18/2013    rec Off acei permanently as of 08/05/2018 due to pseudowheeze    Fatigue 09/04/2021   Greater trochanteric bursitis of right hip 11/13/2017   Hyperlipemia 08/18/2013   Hyperlipidemia    Hypertension    Late effects of CVA (cerebrovascular accident) 11/09/2014   Lumbar degenerative disc disease 06/18/2013   Lumbar spondylosis 06/18/2013   Peripheral vascular disease, unspecified (HCC) status post right carotic endarterectomy 08/28/2018   Sacroiliac inflammation (HCC) 11/13/2017   Sacroiliitis (HCC) 11/13/2017   Smoking 05/23/2015   Spinal stenosis of lumbar region with neurogenic claudication 08/18/2013   Stroke (HCC) 09/08/2013   Supplemental oxygen dependent 10/26/2021   Thoracic aortic atherosclerosis (HCC)     Past Surgical History:  Procedure Laterality Date   CAROTID ENDARTERECTOMY Bilateral    NECK SURGERY      Current Medications: Current Meds  Medication Sig   albuterol (PROVENTIL) (2.5 MG/3ML) 0.083% nebulizer solution Take 3 mLs (2.5 mg total) by nebulization every 6 (six) hours as needed for wheezing or shortness of breath.   albuterol (VENTOLIN HFA) 108 (90 Base) MCG/ACT inhaler Inhale 1 puff into the lungs as needed for wheezing or shortness of breath.   ALPRAZolam (XANAX) 0.25 MG tablet Take 0.25 mg by mouth daily as needed for anxiety or sleep.   atorvastatin (LIPITOR) 40 MG tablet Take 1 tablet (40  mg total) by mouth daily.   benzonatate (TESSALON) 200 MG capsule Take 200 mg by mouth 3 (three) times daily as needed for cough.   budesonide (PULMICORT) 0.25 MG/2ML nebulizer solution Take 2 mLs (0.25 mg total) by nebulization 2 (two) times daily.   clopidogrel (PLAVIX) 75 MG tablet Take 75 mg by mouth daily.    Cyanocobalamin (VITAMIN B12 PO) Take 1 tablet by mouth daily. Unknown strenght   famotidine (PEPCID) 20 MG tablet One after supper (Patient taking differently: Take 20 mg by mouth daily after supper. One after supper)   furosemide (LASIX) 40 MG tablet Take 1.5  tablets (60 mg total) by mouth daily.   ketoconazole (NIZORAL) 2 % cream Apply 1 Application topically daily. Apply 1gm to affected area of feet and rub in well.   Lactobacillus Rhamnosus, GG, (CULTURELLE) CAPS Take 1 capsule by mouth daily.   levothyroxine (SYNTHROID, LEVOTHROID) 50 MCG tablet Take 1 tablet daily by mouth.   lidocaine (LIDODERM) 5 % Place 1 patch onto the skin as needed for pain.   magnesium oxide (MAG-OX) 400 (240 Mg) MG tablet Take 1 tablet by mouth 2 (two) times daily.   metoprolol tartrate (LOPRESSOR) 25 MG tablet TAKE 1 TABLET BY MOUTH 2 TIMES DAILY.   OXYGEN Inhale 2 L into the lungs as needed (Mainly at bedtime). 2lpm as needed   pantoprazole (PROTONIX) 40 MG tablet Take 40 mg by mouth daily.   potassium chloride (MICRO-K) 10 MEQ CR capsule TAKE 1 CAPSULE BY MOUTH DAILY.   sodium chloride HYPERTONIC 3 % nebulizer solution Take by nebulization 3 (three) times daily as needed for other. (Patient taking differently: Take 4 mLs by nebulization 3 (three) times daily as needed for other (Mucus plug).)   traMADol (ULTRAM) 50 MG tablet Take 50 mg by mouth as needed for moderate pain or severe pain.   traZODone (DESYREL) 50 MG tablet Take 50 mg by mouth as needed for sleep.   VITAMIN D, ERGOCALCIFEROL, PO Take 1,000 Units by mouth daily.     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  Tobacco Use   Smoking status: Former    Current packs/day: 0.00    Average packs/day: 1 pack/day for 35.0 years (35.0 ttl pk-yrs)    Types: Cigarettes    Start date: 68    Quit date: 2023    Years since quitting: 2.0   Smokeless tobacco: Never  Vaping Use   Vaping status: Former  Substance and Sexual Activity   Alcohol use: No   Drug  use: No   Sexual activity: Not on file  Other Topics Concern   Not on file  Social History Narrative   Not on file   Social Drivers of Health   Financial Resource Strain: Not on file  Food Insecurity: Low Risk  (07/17/2022)   [facility]   Hunger Vital Sign    Worried About Running Out of Food in the Last Year: Never true    Ran Out of Food in the Last Year: Never true  Transportation Needs: Not on file (07/17/2022)  Physical Activity: Not on file  Stress: Not on file  Social Connections: Not on  file     Family History: The patient's family history includes Heart disease in her father and mother; Hypertension in her mother. ROS:   Please see the history of present illness.    All 14 point review of systems negative except as described per history of present illness  EKGs/Labs/Other Studies Reviewed:    EKG Interpretation Date/Time:  Wednesday March 05 2023 13:14:43 EST Ventricular Rate:  68 PR Interval:  160 QRS Duration:  112 QT Interval:  406 QTC Calculation: 431 R Axis:   -64  Text Interpretation: Normal sinus rhythm Left anterior fascicular block Left ventricular hypertrophy with repolarization abnormality Cannot rule out Septal infarct , age undetermined Abnormal ECG When compared with ECG of 13-Mar-2001 13:49, QRS duration has increased Minimal criteria for Septal infarct are now Present Confirmed by Gypsy Balsam [phone]) on 03/05/2023 1:23:49 PM    Recent Labs: 08/02/2022: BUN 30; Creatinine, Ser 1.62; Magnesium 1.7; Potassium 4.3; Sodium 142  Recent Lipid Panel    Component Value Date/Time   CHOL 162 11/19/2021 1123   TRIG 90 11/19/2021 1123   HDL 61 11/19/2021 1123   CHOLHDL 2.7 11/19/2021 1123   LDLCALC 84 11/19/2021 1123    Physical Exam:    VS:  BP 120/66 (BP Location: Right Arm, Patient Position: Sitting)   Pulse (!) 8   Ht 4\' 9"  (1.448 m)   Wt 145 lb 3.2 oz (65.9 kg)   SpO2 99%   BMI 31.42 kg/m     Wt  Readings from Last 3 Encounters:  03/05/23 145 lb 3.2 oz (65.9 kg)  01/21/23 150 lb 6.4 oz (68.2 kg)  10/15/22 147 lb 12.8 oz (67 kg)     GEN:  Well nourished, well developed in no acute distress HEENT: Normal NECK: No JVD; No carotid bruits LYMPHATICS: No lymphadenopathy CARDIAC: RRR, systolic murmur with 2-3/6 best heard right upper sternal, no rubs, no gallops RESPIRATORY:  Clear to auscultation without rales, bilateral wheeze and rhonchi ABDOMEN: Soft, non-tender, non-distended MUSCULOSKELETAL:  No edema; No deformity  SKIN: Warm and dry LOWER EXTREMITIES: no swelling NEUROLOGIC:  Alert and oriented x 3 PSYCHIATRIC:  Normal affect   ASSESSMENT:    1. Essential hypertension   2. Nonrheumatic aortic valve stenosis   3. Chronic diastolic congestive heart failure (HCC)   4. Primary hypertension   5. Hyperlipidemia, unspecified hyperlipidemia type   6. Late effects of CVA (cerebrovascular accident)    PLAN:    In order of problems listed above:  Aortic stenosis will schedule her to have echocardiogram done in May to check the severity of the lesion. Essential hypertension blood pressure well-controlled continue present management. Chronic diastolic congestive heart failure compensated on physical exam continue present management. Dyslipidemia I did review K PN which show me data from 2023 LDL 84 HDL 61 continue present management   Medication Adjustments/Labs and Tests Ordered: Current medicines are reviewed at length with the patient today.  Concerns regarding medicines are outlined above.  Orders Placed This Encounter  Procedures   EKG 12-Lead   Medication changes: No orders of the defined types were placed in this encounter.   Signed, Georgeanna Lea, MD, [facility] 03/05/2023 1:33 PM    [facility]

## 2023-03-21 ENCOUNTER — Ambulatory Visit: Payer: Medicare Other | Admitting: Podiatry

## 2023-03-21 ENCOUNTER — Ambulatory Visit (INDEPENDENT_AMBULATORY_CARE_PROVIDER_SITE_OTHER): Payer: Commercial Indemnity

## 2023-03-21 ENCOUNTER — Ambulatory Visit: Payer: Medicare HMO | Admitting: Cardiology

## 2023-03-21 DIAGNOSIS — M778 Other enthesopathies, not elsewhere classified: Secondary | ICD-10-CM

## 2023-03-21 DIAGNOSIS — S99921A Unspecified injury of right foot, initial encounter: Secondary | ICD-10-CM

## 2023-03-21 NOTE — Progress Notes (Signed)
Chief Complaint  Patient presents with   Foot Injury    Got right foot caught in recliner, 3 weeks ago. She is able to move toes and foot but wants to make sure nothing is seriously wrong. Not diabetic, takes plavix.  Xrays up   HPI: 89 y.o. female presents today with a family member, for concern of an injury that occurred to the right foot 3 weeks ago.  Patient got her right foot caught on her recliner and bent the foot underneath her.  She had pain at the time.  She denies any significant bruising.  She notes that there is no pain to the foot today.  States that she is going to make sure everything was okay.  The family member stated that the patient's insurance changed and felt that all of her nail and callus care could also be performed today, but this was not noted when the appointment was made.  Past Medical History:  Diagnosis Date   Aortic stenosis 11/09/2014   Overview:  Mild in 2015 Moderate to severe in the October 2018   At high risk for injury related to fall 10/26/2021   Chronic respiratory failure with hypoxia (HCC) 08/06/2018   02 hs x 2lpm as of 08/05/2018 and prn daytime to keep sats > 90%  - 09/01/2018 no desats walking / limited by back    COPD (chronic obstructive pulmonary disease) (HCC)    Coronary artery disease    CVA (cerebral vascular accident) (HCC)    Diastolic congestive heart failure (HCC) 07/31/2018   DOE (dyspnea on exertion) 12/11/2016   Quit smoking 07/22/18 with emphysema on ct from 06/12/2018 - PFT 02/17/17    FEV1 1.35 [118%], ratio 0.82 with 14% resp to saba and TLC 93%, DLCO 73% - Echo 01/20/2018 c/w mod AS s PH - pt stopped vasotec and anoro on her own prior to pulmonary ov 08/05/2018 > rec leave off and rx for gerd x 4 weeks > improved as of 09/01/2018  - 09/01/2018   Walked RA x one lap =  approx 250 ft avg pace >> stopped due to   Essential hypertension 08/18/2013   rec Off acei permanently as of 08/05/2018 due to pseudowheeze    Fatigue 09/04/2021    Greater trochanteric bursitis of right hip 11/13/2017   Hyperlipemia 08/18/2013   Hyperlipidemia    Hypertension    Late effects of CVA (cerebrovascular accident) 11/09/2014   Lumbar degenerative disc disease 06/18/2013   Lumbar spondylosis 06/18/2013   Peripheral vascular disease, unspecified (HCC) status post right carotic endarterectomy 08/28/2018   Sacroiliac inflammation (HCC) 11/13/2017   Sacroiliitis (HCC) 11/13/2017   Smoking 05/23/2015   Spinal stenosis of lumbar region with neurogenic claudication 08/18/2013   Stroke (HCC) 09/08/2013   Supplemental oxygen dependent 10/26/2021   Thoracic aortic atherosclerosis (HCC)     Past Surgical History:  Procedure Laterality Date   CAROTID ENDARTERECTOMY Bilateral    NECK SURGERY      Allergies  Allergen Reactions   Codeine Hives and Nausea Only     Physical Exam: Trace palpable pedal pulses.  Mild edema generalized to the lower legs and ankles bilateral.  No pain on palpation to the right first MPJ, lesser MPJs, tarsometatarsal joints or midtarsal joints.  No pain on palpation to the ankle or heel area.  No ecchymosis is noted.  No open lesions are noted.  No calor or rubor is noted.  Epicritic sensation is intact.  Radiographic Exam (  right foot, 3 partial weightbearing views, 03/21/2023):  Decreased osseous mineralization to the foot.  There is joint space narrowing at the first MPJ.  There is medial angulation of the second third toes at the level of the MPJ.  There is medial angulation of the fourth and fifth toes at the DIPJ.  There is a posterior calcaneal spur seen.  No fractures noted.  Assessment/Plan of Care: 1. Injury of right foot, initial encounter   2. Capsulitis of right foot     Discussed clinical and radiographic findings with patient today.  Patient is already scheduled for her at risk footcare appointment within the next few weeks.  Encouraged her to keep that appointment as there would not extra time allotted  for the requested additional care today.  Informed patient that there is no fracture seen.  There is some generalized arthritic changes and some osteopenia throughout the foot.  These are typical expectations for patient of this age.  Just recommend comfortable, supportive, and cushioned lightweight shoes.  She can use Biofreeze for any arthritic pain, or Voltaren gel BID as needed.  Clerance Lav, DPM, FACFAS [facility]     [address] [place], [place] [address]                Office [phone]  Fax [phone]

## 2023-03-25 ENCOUNTER — Encounter: Payer: Self-pay | Admitting: Cardiology

## 2023-03-25 ENCOUNTER — Ambulatory Visit: Payer: Medicare Other | Attending: Cardiology | Admitting: Cardiology

## 2023-03-25 VITALS — BP 132/72 | HR 78 | Ht <= 58 in | Wt 145.4 lb

## 2023-03-25 DIAGNOSIS — I5032 Chronic diastolic (congestive) heart failure: Secondary | ICD-10-CM

## 2023-03-25 DIAGNOSIS — I350 Nonrheumatic aortic (valve) stenosis: Secondary | ICD-10-CM

## 2023-03-25 DIAGNOSIS — E782 Mixed hyperlipidemia: Secondary | ICD-10-CM

## 2023-03-25 DIAGNOSIS — I739 Peripheral vascular disease, unspecified: Secondary | ICD-10-CM | POA: Diagnosis not present

## 2023-03-25 NOTE — Patient Instructions (Addendum)
Medication Instructions:  Your physician recommends that you continue on your current medications as directed. Please refer to the Current Medication list given to you today.  *If you need a refill on your cardiac medications before your next appointment, please call your pharmacy*   Lab Work: Your physician recommends that you return for lab work in: end of week or first of next week You need to have labs done when you are fasting.  You can come Monday through Friday 8:30 am to 12:00 pm and 1:15 to 4:30. You do not need to make an appointment as the order has already been placed. The labs you are going to have done are BMET, Mgm.    Testing/Procedures: None Ordered   Follow-Up: At [facility], you and your health needs are our priority.  As part of our continuing mission to provide you with exceptional heart care, we have created designated Provider Care Teams.  These Care Teams include your primary Cardiologist (physician) and Advanced Practice Providers (APPs -  Physician Assistants and Nurse Practitioners) who all work together to provide you with the care you need, when you need it.  We recommend signing up for the patient portal called "MyChart".  Sign up information is provided on this After Visit Summary.  MyChart is used to connect with patients for Virtual Visits (Telemedicine).  Patients are able to view lab/test results, encounter notes, upcoming appointments, etc.  Non-urgent messages can be sent to your provider as well.   To learn more about what you can do with MyChart, go to [email or web address].    Your next appointment:   3 month(s)  The format for your next appointment:   In Person  Provider:   Gypsy Balsam, MD    Other Instructions NA

## 2023-03-25 NOTE — Addendum Note (Signed)
Addended by: Baldo Ash D on: 03/25/2023 10:15 AM   Modules accepted: Orders

## 2023-03-25 NOTE — Progress Notes (Signed)
Cardiology Office Note:    Date:  03/25/2023   ID:  Annette Huang, DOB 1927/08/29, MRN [id number]  PCP:  Leane Call, PA-C  Cardiologist:  Gypsy Balsam, MD    Referring MD: Leane Call, PA-C   No chief complaint on file.   History of Present Illness:    Annette Huang is a 89 y.o. female past medical history significant for aortic stenosis which is initially assessed as moderate to severe but then last May she got echocardiogram done in the hospital showing only mild stenosis with a gradient only 15 additional problem include longstanding smoking she does not smoke anymore, COPD, history of CVA, essential hypertension.  Recently few weeks ago she went to her primary care physician feeling not good.  She was given antibiotic steroids as well as high-dose of diuretics and she looks and feels much better.  Denies have any chest pain tightness squeezing pressure burning chest overall seems to be doing well  Past Medical History:  Diagnosis Date   Aortic stenosis 11/09/2014   Overview:  Mild in 2015 Moderate to severe in the October 2018   At high risk for injury related to fall 10/26/2021   Chronic respiratory failure with hypoxia (HCC) 08/06/2018   02 hs x 2lpm as of 08/05/2018 and prn daytime to keep sats > 90%  - 09/01/2018 no desats walking / limited by back    COPD (chronic obstructive pulmonary disease) (HCC)    Coronary artery disease    CVA (cerebral vascular accident) (HCC)    Diastolic congestive heart failure (HCC) 07/31/2018   DOE (dyspnea on exertion) 12/11/2016   Quit smoking 07/22/18 with emphysema on ct from 06/12/2018 - PFT 02/17/17    FEV1 1.35 [118%], ratio 0.82 with 14% resp to saba and TLC 93%, DLCO 73% - Echo 01/20/2018 c/w mod AS s PH - pt stopped vasotec and anoro on her own prior to pulmonary ov 08/05/2018 > rec leave off and rx for gerd x 4 weeks > improved as of 09/01/2018  - 09/01/2018   Walked RA x one lap =  approx 250 ft avg pace >> stopped due to    Essential hypertension 08/18/2013   rec Off acei permanently as of 08/05/2018 due to pseudowheeze    Fatigue 09/04/2021   Greater trochanteric bursitis of right hip 11/13/2017   Hyperlipemia 08/18/2013   Hyperlipidemia    Hypertension    Late effects of CVA (cerebrovascular accident) 11/09/2014   Lumbar degenerative disc disease 06/18/2013   Lumbar spondylosis 06/18/2013   Peripheral vascular disease, unspecified (HCC) status post right carotic endarterectomy 08/28/2018   Sacroiliac inflammation (HCC) 11/13/2017   Sacroiliitis (HCC) 11/13/2017   Smoking 05/23/2015   Spinal stenosis of lumbar region with neurogenic claudication 08/18/2013   Stroke (HCC) 09/08/2013   Supplemental oxygen dependent 10/26/2021   Thoracic aortic atherosclerosis (HCC)     Past Surgical History:  Procedure Laterality Date   CAROTID ENDARTERECTOMY Bilateral    NECK SURGERY      Current Medications: Current Meds  Medication Sig   albuterol (PROVENTIL) (2.5 MG/3ML) 0.083% nebulizer solution Take 3 mLs (2.5 mg total) by nebulization every 6 (six) hours as needed for wheezing or shortness of breath.   albuterol (VENTOLIN HFA) 108 (90 Base) MCG/ACT inhaler Inhale 1 puff into the lungs as needed for wheezing or shortness of breath.   ALPRAZolam (XANAX) 0.25 MG tablet Take 0.25 mg by mouth daily as needed for anxiety or sleep.   atorvastatin (  LIPITOR) 40 MG tablet Take 1 tablet (40 mg total) by mouth daily.   benzonatate (TESSALON) 200 MG capsule Take 200 mg by mouth 3 (three) times daily as needed for cough.   budesonide (PULMICORT) 0.25 MG/2ML nebulizer solution Take 2 mLs (0.25 mg total) by nebulization 2 (two) times daily.   clopidogrel (PLAVIX) 75 MG tablet Take 75 mg by mouth daily.    Cyanocobalamin (VITAMIN B12 PO) Take 1 tablet by mouth daily. Unknown strenght   famotidine (PEPCID) 20 MG tablet One after supper (Patient taking differently: Take 20 mg by mouth daily after supper. One after supper)    furosemide (LASIX) 40 MG tablet Take 1.5 tablets (60 mg total) by mouth daily.   ketoconazole (NIZORAL) 2 % cream Apply 1 Application topically daily. Apply 1gm to affected area of feet and rub in well.   Lactobacillus Rhamnosus, GG, (CULTURELLE) CAPS Take 1 capsule by mouth daily.   levothyroxine (SYNTHROID, LEVOTHROID) 50 MCG tablet Take 1 tablet daily by mouth.   lidocaine (LIDODERM) 5 % Place 1 patch onto the skin as needed for pain.   magnesium oxide (MAG-OX) 400 (240 Mg) MG tablet Take 1 tablet by mouth 2 (two) times daily.   metoprolol tartrate (LOPRESSOR) 25 MG tablet TAKE 1 TABLET BY MOUTH 2 TIMES DAILY.   OXYGEN Inhale 2 L into the lungs as needed (Mainly at bedtime). 2lpm as needed   pantoprazole (PROTONIX) 40 MG tablet Take 40 mg by mouth daily.   potassium chloride (MICRO-K) 10 MEQ CR capsule TAKE 1 CAPSULE BY MOUTH DAILY.   sodium chloride HYPERTONIC 3 % nebulizer solution Take by nebulization 3 (three) times daily as needed for other. (Patient taking differently: Take 4 mLs by nebulization 3 (three) times daily as needed for other (Mucus plug).)   traMADol (ULTRAM) 50 MG tablet Take 50 mg by mouth as needed for moderate pain or severe pain.   traZODone (DESYREL) 50 MG tablet Take 50 mg by mouth as needed for sleep.   VITAMIN D, ERGOCALCIFEROL, PO Take 1,000 Units by mouth daily.     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  Tobacco Use   Smoking status: Former    Current packs/day: 0.00    Average packs/day: 1 pack/day for 35.0 years (35.0 ttl pk-yrs)    Types: Cigarettes    Start date: 34    Quit date: 2023    Years since quitting: 2.1   Smokeless tobacco: Never  Vaping Use   Vaping status: Former  Substance and  Sexual Activity   Alcohol use: No   Drug use: No   Sexual activity: Not on file  Other Topics Concern   Not on file  Social History Narrative   Not on file   Social Drivers of Health   Financial Resource Strain: Not on file  Food Insecurity: Low Risk  (07/17/2022)   [facility]   Hunger Vital Sign    Worried About Running Out of Food in the Last Year: Never true    Ran Out of Food in the Last Year: Never true  Transportation Needs: Not on file (07/17/2022)  Physical Activity: Not on file  Stress:  Not on file  Social Connections: Not on file     Family History: The patient's family history includes Heart disease in her father and mother; Hypertension in her mother. ROS:   Please see the history of present illness.    All 14 point review of systems negative except as described per history of present illness  EKGs/Labs/Other Studies Reviewed:         Recent Labs: 08/02/2022: BUN 30; Creatinine, Ser 1.62; Magnesium 1.7; Potassium 4.3; Sodium 142  Recent Lipid Panel    Component Value Date/Time   CHOL 162 11/19/2021 1123   TRIG 90 11/19/2021 1123   HDL 61 11/19/2021 1123   CHOLHDL 2.7 11/19/2021 1123   LDLCALC 84 11/19/2021 1123    Physical Exam:    VS:  BP 132/72   Pulse 78   Ht 4\' 10"  (1.473 m)   Wt 145 lb 6.4 oz (66 kg)   SpO2 95%   BMI 30.39 kg/m     Wt Readings from Last 3 Encounters:  03/25/23 145 lb 6.4 oz (66 kg)  03/05/23 145 lb 3.2 oz (65.9 kg)  01/21/23 150 lb 6.4 oz (68.2 kg)     GEN:  Well nourished, well developed in no acute distress HEENT: Normal NECK: No JVD; No carotid bruits LYMPHATICS: No lymphadenopathy CARDIAC: RRR, systolic ejection murmur grade 2/6 to 3/6 best heard right upper portion of the sternum, no rubs, no gallops RESPIRATORY:  Clear to auscultation without rales, wheezing or rhonchi  ABDOMEN: Soft, non-tender, non-distended MUSCULOSKELETAL:  No edema; No deformity  SKIN: Warm and dry LOWER  EXTREMITIES: no swelling NEUROLOGIC:  Alert and oriented x 3 PSYCHIATRIC:  Normal affect   ASSESSMENT:    1. Nonrheumatic aortic valve stenosis   2. Chronic diastolic congestive heart failure (HCC)   3. Peripheral vascular disease, unspecified (HCC) status post right carotic endarterectomy   4. Mixed hyperlipidemia    PLAN:    In order of problems listed above:  Nonrheumatic aortic valve stenosis she is scheduled to have an echocardiogram done will wait for results of it. Chronic diastolic congestive heart failure did review blood test done by primary care physician showing proBNP of 399.  She was given extra dose of Lasix that she takes 60 in the morning and 20 in the afternoon I asked her to stop 20 mg dose of Lasix in the afternoon and simply check her weight every single day if she sees some increased weight in consecutive 2 days by more than 2 pounds then extra dose of Lasix need to be given. Peripheral vascular disease stable. Dyslipidemia I did review K PN which show me LDL 84 HDL 61.  Will continue present management   Medication Adjustments/Labs and Tests Ordered: Current medicines are reviewed at length with the patient today.  Concerns regarding medicines are outlined above.  No orders of the defined types were placed in this encounter.  Medication changes: No orders of the defined types were placed in this encounter.   Signed, Georgeanna Lea, MD, [facility] 03/25/2023 10:01 AM    [facility]

## 2023-03-31 ENCOUNTER — Other Ambulatory Visit: Payer: Self-pay | Admitting: Cardiology

## 2023-04-02 LAB — BASIC METABOLIC PANEL
BUN/Creatinine Ratio: 16 (ref 12–28)
BUN: 23 mg/dL (ref 10–36)
CO2: 29 mmol/L (ref 20–29)
Calcium: 9.7 mg/dL (ref 8.7–10.3)
Chloride: 97 mmol/L (ref 96–106)
Creatinine, Ser: 1.45 mg/dL — ABNORMAL HIGH (ref 0.57–1.00)
Glucose: 129 mg/dL — ABNORMAL HIGH (ref 70–99)
Potassium: 4.2 mmol/L (ref 3.5–5.2)
Sodium: 141 mmol/L (ref 134–144)
eGFR: 34 mL/min/{1.73_m2} — ABNORMAL LOW (ref >59)

## 2023-04-02 LAB — MAGNESIUM: Magnesium: 2.3 mg/dL (ref 1.6–2.3)

## 2023-04-04 ENCOUNTER — Telehealth: Payer: Self-pay

## 2023-04-04 NOTE — Telephone Encounter (Signed)
 Left message on My Chart with normal results per Dr. Hulen Shouts note. Routed to PCP.

## 2023-04-07 ENCOUNTER — Other Ambulatory Visit: Payer: Self-pay

## 2023-04-07 ENCOUNTER — Telehealth: Payer: Self-pay

## 2023-04-07 ENCOUNTER — Encounter: Payer: Self-pay | Admitting: Cardiology

## 2023-04-07 ENCOUNTER — Telehealth: Payer: Self-pay | Admitting: Cardiology

## 2023-04-07 NOTE — Telephone Encounter (Signed)
 Left message for the patient to call back.

## 2023-04-07 NOTE — Telephone Encounter (Signed)
 Pt viewed lab results on My Chart per Dr. Hulen Shouts note. Routed to PCP.

## 2023-04-07 NOTE — Telephone Encounter (Signed)
 Pt c/o medication issue:  1. Name of Medication:   magnesium oxide (MAG-OX) 400 (240 Mg) MG tablet  furosemide (LASIX) 40 MG tablet   2. How are you currently taking this medication (dosage and times per day)?   3. Are you having a reaction (difficulty breathing--STAT)?   4. What is your medication issue?   Daughter Dondra Spry) wants to know the dosage of these medications the patient should be taking.  Daughter noted can also sent message in MyChart.

## 2023-04-07 NOTE — Telephone Encounter (Signed)
 Called Gail Allred and clarified what dose of Lasix and Magnesium the patient should be taking. Dondra Spry verbalized understanding and had no further questions at this time.

## 2023-04-07 NOTE — Telephone Encounter (Signed)
 Pt is returning call.

## 2023-04-07 NOTE — Telephone Encounter (Signed)
Error

## 2023-04-15 ENCOUNTER — Ambulatory Visit: Payer: Medicare HMO | Admitting: Internal Medicine

## 2023-04-22 ENCOUNTER — Other Ambulatory Visit: Payer: Self-pay | Admitting: Internal Medicine

## 2023-04-24 ENCOUNTER — Ambulatory Visit: Payer: Medicare HMO | Admitting: Podiatry

## 2023-05-01 ENCOUNTER — Ambulatory Visit: Admitting: Podiatry

## 2023-05-01 DIAGNOSIS — M79674 Pain in right toe(s): Secondary | ICD-10-CM | POA: Diagnosis not present

## 2023-05-01 DIAGNOSIS — M79675 Pain in left toe(s): Secondary | ICD-10-CM | POA: Diagnosis not present

## 2023-05-01 DIAGNOSIS — B351 Tinea unguium: Secondary | ICD-10-CM

## 2023-05-01 DIAGNOSIS — I739 Peripheral vascular disease, unspecified: Secondary | ICD-10-CM | POA: Diagnosis not present

## 2023-05-01 DIAGNOSIS — L84 Corns and callosities: Secondary | ICD-10-CM | POA: Diagnosis not present

## 2023-05-01 NOTE — Progress Notes (Signed)
 Subjective:  Patient ID: Annette Huang, female    DOB: Jul 14, 1927,  MRN: [id number]  DOSIA Huang presents to clinic today for:  Chief Complaint  Patient presents with   [facility]    RFC with callous/corn on right 4th lateral side of toe. Not diabetic and takes plavix   Patient notes nails are thick and elongated, causing pain in shoe gear when ambulating.  She has the painful corn on the right fifth toe and fourth interspace.  Her daughter is with her today.  PCP is Nodal, Joline Salt, PA-C.  Past Medical History:  Diagnosis Date   Aortic stenosis 11/09/2014   Overview:  Mild in 2015 Moderate to severe in the October 2018   At high risk for injury related to fall 10/26/2021   Chronic respiratory failure with hypoxia (HCC) 08/06/2018   02 hs x 2lpm as of 08/05/2018 and prn daytime to keep sats > 90%  - 09/01/2018 no desats walking / limited by back    COPD (chronic obstructive pulmonary disease) (HCC)    Coronary artery disease    CVA (cerebral vascular accident) (HCC)    Diastolic congestive heart failure (HCC) 07/31/2018   DOE (dyspnea on exertion) 12/11/2016   Quit smoking 07/22/18 with emphysema on ct from 06/12/2018 - PFT 02/17/17    FEV1 1.35 [118%], ratio 0.82 with 14% resp to saba and TLC 93%, DLCO 73% - Echo 01/20/2018 c/w mod AS s PH - pt stopped vasotec and anoro on her own prior to pulmonary ov 08/05/2018 > rec leave off and rx for gerd x 4 weeks > improved as of 09/01/2018  - 09/01/2018   Walked RA x one lap =  approx 250 ft avg pace >> stopped due to   Essential hypertension 08/18/2013   rec Off acei permanently as of 08/05/2018 due to pseudowheeze    Fatigue 09/04/2021   Greater trochanteric bursitis of right hip 11/13/2017   Hyperlipemia 08/18/2013   Hyperlipidemia    Hypertension    Late effects of CVA (cerebrovascular accident) 11/09/2014   Lumbar degenerative disc disease 06/18/2013   Lumbar spondylosis 06/18/2013   Peripheral vascular disease, unspecified (HCC)  status post right carotic endarterectomy 08/28/2018   Sacroiliac inflammation (HCC) 11/13/2017   Sacroiliitis (HCC) 11/13/2017   Smoking 05/23/2015   Spinal stenosis of lumbar region with neurogenic claudication 08/18/2013   Stroke (HCC) 09/08/2013   Supplemental oxygen dependent 10/26/2021   Thoracic aortic atherosclerosis (HCC)     Allergies  Allergen Reactions   Codeine Hives and Nausea Only    Objective:  Annette Huang is a pleasant 89 y.o. female in NAD. AAO x 3.  Vascular Examination: Patient has palpable DP pulse, absent PT pulse bilateral.  Delayed capillary refill bilateral toes.  Sparse digital hair bilateral.  Proximal to distal cooling WNL bilateral.    Dermatological Examination: Interspaces are clear with no open lesions noted bilateral.  Skin is shiny and atrophic bilateral.  Nails are 3-28mm thick, with yellowish/brown discoloration, subungual debris and distal onycholysis x10.  There is pain with compression of nails x10.  There are hyperkeratotic lesions noted on medial aspect of right fifth toe DIPJ and dorsal lateral aspect of right fifth toe PIPJ.  No ulceration is noted.  Patient qualifies for at-risk foot care because of PVD.  Assessment/Plan: 1. Pain due to onychomycosis of toenails of both feet   2. Corns   3. PVD (peripheral vascular disease) (HCC)    Mycotic  nails x10 were sharply debrided with sterile nail nippers and power debriding burr to decrease bulk and length.  Hyperkeratotic lesions x 2 were shaved with #312 blade.  Only one of the lesions is proximal to the DIPJ of the toe  Patient was given a small Silipos toe cap as well as a ribbed gel tube sleeve for the right fifth toe to see if 1 is more comfortable than the other  Return in about 3 months (around 08/01/2023) for RFC.   Clerance Lav, DPM, FACFAS [facility]     [address] [place], [place] [address]                Office  [phone]  Fax [phone]

## 2023-05-07 ENCOUNTER — Encounter: Payer: Self-pay | Admitting: Internal Medicine

## 2023-05-07 ENCOUNTER — Ambulatory Visit: Admitting: Internal Medicine

## 2023-05-07 VITALS — BP 130/86 | HR 87 | Ht <= 58 in | Wt 141.8 lb

## 2023-05-07 DIAGNOSIS — J9611 Chronic respiratory failure with hypoxia: Secondary | ICD-10-CM | POA: Diagnosis not present

## 2023-05-07 DIAGNOSIS — J310 Chronic rhinitis: Secondary | ICD-10-CM

## 2023-05-07 DIAGNOSIS — K219 Gastro-esophageal reflux disease without esophagitis: Secondary | ICD-10-CM | POA: Diagnosis not present

## 2023-05-07 DIAGNOSIS — J479 Bronchiectasis, uncomplicated: Secondary | ICD-10-CM | POA: Diagnosis not present

## 2023-05-07 MED ORDER — SODIUM CHLORIDE 3 % IN NEBU
4.0000 mL | INHALATION_SOLUTION | Freq: Three times a day (TID) | RESPIRATORY_TRACT | 12 refills | Status: AC | PRN
  Filled 2024-02-19: qty 750, 25d supply, fill #0

## 2023-05-07 MED ORDER — BUDESONIDE 0.25 MG/2ML IN SUSP: 0.2500 mg | Freq: Two times a day (BID) | RESPIRATORY_TRACT | 3 refills | Status: AC

## 2023-05-07 MED ORDER — ALBUTEROL SULFATE (2.5 MG/3ML) 0.083% IN NEBU: 2.5000 mg | INHALATION_SOLUTION | Freq: Four times a day (QID) | RESPIRATORY_TRACT | 11 refills | Status: DC | PRN

## 2023-05-07 NOTE — Patient Instructions (Addendum)
 It was a pleasure to see you today!  Please schedule follow up scheduled with myself in 6 months.  If my schedule is not open yet, we will contact you with a reminder closer to that time. Please call [phone] if you haven't heard from [place] a month before, and always call us sooner if issues or concerns arise. You can also send [place] a message through MyChart, but but aware that this is not to be used for urgent issues and it may take up to 5-7 days to receive a reply. Please be aware that you will likely be able to view your results before I have a chance to respond to them. Please give [place] 5 business days to respond to any non-urgent results.    Glad you are doing well. Continue the pulmicort, saline, and albuterol nebulizer therapies twice daily with the flutter valve as you are doing.   If your oxygen levels get lower than 90% on your 2LNC, increase your nebulizer therapies to 4 times a day with flutter valve and please give [place] a call.   Continue PPI for GERD with tums as needed.  Sleeping on a wedge pillow at a 30 degree incline can also be helpful in helping nighttime symptoms and improving the early morning cough.  Call us sooner if any issues or concerns with the breathing.   Signs or symptoms of pneumonia might be feeling more tired, low oxygen levels that do not improve with the breathing treatments, decreased appetite, feeling more confused.  If the symptoms are sustained for more than a day and are not improving with typical rescue therapy you may need to be seen in the office.  Please call us if this happens.

## 2023-05-07 NOTE — Progress Notes (Signed)
 Annette Huang    [id number]    Jun 12, 1927  Primary Care Physician:Nodal, Joline Salt, PA-C Date of Appointment: 05/07/2023 Established Patient Visit  Chief complaint:   Chief Complaint  Patient presents with   Follow-up    Patient breathing gets worse in the morning.     HPI: Annette Huang is a 89 y.o. woman with shortness of breath. Last seen by Dr. Sherene Sires in Feb 2024. Saw TP the last two times. Spirometry June 2023 shows moderate airflow limitation FEV1 62% of predicted. COPD given history of tobacco use. Also on [facility]. Significant cardiac issues - chronic hfpef and moderate AS. History of CVA.  Interval Updates: Here for follow up. No interval hospitalizations or respiratory infections.   Yesterday she had an episode of dyspnea with exertion. Relieved with albuterol. Oxygen levels dropped 88% but recovered with breathing treatents.   Continues to take her airway clearance. Pulmicort with albuterol, saline nebs BID with flutter valve. Still on 2.5LNC.   Still living independently but daughter comes in to check on her frequently and will spend the night. Here with a family friend today.   Feels indigestion is relieved with tums. Takes protonix daily.    I have reviewed the patient's family social and past medical history and updated as appropriate.   Past Medical History:  Diagnosis Date   Aortic stenosis 11/09/2014   Overview:  Mild in 2015 Moderate to severe in the October 2018   At high risk for injury related to fall 10/26/2021   Chronic respiratory failure with hypoxia (HCC) 08/06/2018   02 hs x 2lpm as of 08/05/2018 and prn daytime to keep sats > 90%  - 09/01/2018 no desats walking / limited by back    COPD (chronic obstructive pulmonary disease) (HCC)    Coronary artery disease    CVA (cerebral vascular accident) (HCC)    Diastolic congestive heart failure (HCC) 07/31/2018   DOE (dyspnea on exertion) 12/11/2016   Quit smoking 07/22/18 with emphysema on  ct from 06/12/2018 - PFT 02/17/17    FEV1 1.35 [118%], ratio 0.82 with 14% resp to saba and TLC 93%, DLCO 73% - Echo 01/20/2018 c/w mod AS s PH - pt stopped vasotec and anoro on her own prior to pulmonary ov 08/05/2018 > rec leave off and rx for gerd x 4 weeks > improved as of 09/01/2018  - 09/01/2018   Walked RA x one lap =  approx 250 ft avg pace >> stopped due to   Essential hypertension 08/18/2013   rec Off acei permanently as of 08/05/2018 due to pseudowheeze    Fatigue 09/04/2021   Greater trochanteric bursitis of right hip 11/13/2017   Hyperlipemia 08/18/2013   Hyperlipidemia    Hypertension    Late effects of CVA (cerebrovascular accident) 11/09/2014   Lumbar degenerative disc disease 06/18/2013   Lumbar spondylosis 06/18/2013   Peripheral vascular disease, unspecified (HCC) status post right carotic endarterectomy 08/28/2018   Sacroiliac inflammation (HCC) 11/13/2017   Sacroiliitis (HCC) 11/13/2017   Smoking 05/23/2015   Spinal stenosis of lumbar region with neurogenic claudication 08/18/2013   Stroke (HCC) 09/08/2013   Supplemental oxygen dependent 10/26/2021   Thoracic aortic atherosclerosis (HCC)     Past Surgical History:  Procedure Laterality Date   CAROTID ENDARTERECTOMY Bilateral    NECK SURGERY      Family History  Problem Relation Age of Onset   Heart disease Mother    Hypertension Mother  Heart disease Father     Social History   Occupational History   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  Gen:      Elderly, spry ENT:  on nasal cannula Lungs:   kyphosis, breathing non labored, no wheeze CV:         RRR, systolic murmur   Data Reviewed: Imaging: I have personally reviewed the chest xray Sept 2024 which shows well aerated lung no mucus plug.    PFTs:     Latest Ref Rng & Units 02/17/2017   12:50 PM  PFT Results  FVC-Pre L 1.50   FVC-Predicted Pre % 94   FVC-Post L 1.64   FVC-Predicted Post % 103   Pre FEV1/FVC % % 79   Post FEV1/FCV % % 82   FEV1-Pre L 1.18   FEV1-Predicted Pre % 103   FEV1-Post L 1.35   DLCO uncorrected ml/min/mmHg 11.64   DLCO UNC% % 71   DLCO corrected ml/min/mmHg 11.94   DLCO COR %Predicted % 73   DLVA Predicted % 88   TLC L 3.90   TLC % Predicted % 93   RV % Predicted % 94    I have personally reviewed the patient's PFTs and no airflow limitation in 2019  Labs:  Immunization status: Immunization History  Administered Date(s) Administered   Influenza Split 12/09/2016   Influenza-Unspecified 01/04/2013, 10/05/2013, 11/04/2017    External Records Personally Reviewed: cardiology, pulmonary  Assessment:  COPD with bronchiectasis, controlled Chronic respiratory failure Moderate aortic stenosis Chronic rhinitis GERD  Plan/Recommendations:  Continue the pulmicort, saline, and albuterol nebulizer therapies twice daily with the flutter valve as you are doing.   If your oxygen levels get lower than 90% on your 2LNC, increase your nebulizer therapies to 4 times a day with flutter valve and please give [place] a call.   Continue PPI for GERD with tums as needed.   Call us sooner if any issues or concerns with the breathing.   Signs or symptoms of pneumonia might be feeling more tired, low oxygen levels that do not improve with the breathing treatments, decreased appetite, feeling more confused.  If the symptoms are sustained for more than a day and are not improving with typical rescue  therapy you may need to be seen in the office.  Please call us if this happens.  Return to Care: Return in about 6 months (around 11/06/2023).   Durel Salts, MD Pulmonary and Critical Care Medicine [facility] Office:[phone]

## 2023-05-26 ENCOUNTER — Ambulatory Visit: Admitting: Internal Medicine

## 2023-06-05 ENCOUNTER — Other Ambulatory Visit: Payer: Medicare HMO

## 2023-06-10 ENCOUNTER — Ambulatory Visit: Payer: Medicare HMO

## 2023-06-23 ENCOUNTER — Ambulatory Visit: Payer: Medicare Other | Admitting: Cardiology

## 2023-06-24 ENCOUNTER — Ambulatory Visit: Attending: Cardiology

## 2023-06-24 DIAGNOSIS — I350 Nonrheumatic aortic (valve) stenosis: Secondary | ICD-10-CM | POA: Diagnosis not present

## 2023-06-24 LAB — ECHOCARDIOGRAM COMPLETE
AR max vel: 0.7 cm2
AV Area VTI: 0.71 cm2
AV Area mean vel: 0.68 cm2
AV Mean grad: 21 mmHg
AV Peak grad: 37.4 mmHg
Ao pk vel: 3.06 m/s
Area-P 1/2: 3.03 cm2
S' Lateral: 2.8 cm

## 2023-06-26 ENCOUNTER — Ambulatory Visit: Payer: Self-pay | Admitting: Cardiology

## 2023-07-14 ENCOUNTER — Telehealth: Payer: Self-pay

## 2023-07-14 NOTE — Telephone Encounter (Signed)
 Echo Results reviewed with pt's daughter as per Dr. Vanetta Shawl note.  Daughter verbalized understanding and had no additional questions. Routed to PCP

## 2023-07-22 ENCOUNTER — Other Ambulatory Visit (HOSPITAL_BASED_OUTPATIENT_CLINIC_OR_DEPARTMENT_OTHER): Payer: Self-pay

## 2023-07-22 DIAGNOSIS — F411 Generalized anxiety disorder: Secondary | ICD-10-CM

## 2023-07-22 HISTORY — DX: Generalized anxiety disorder: F41.1

## 2023-07-22 MED ORDER — ALBUTEROL SULFATE (2.5 MG/3ML) 0.083% IN NEBU: 2.5000 mg | INHALATION_SOLUTION | Freq: Four times a day (QID) | RESPIRATORY_TRACT | 11 refills | Status: AC | PRN

## 2023-08-14 ENCOUNTER — Ambulatory Visit: Admitting: Podiatry

## 2023-08-14 DIAGNOSIS — L84 Corns and callosities: Secondary | ICD-10-CM | POA: Diagnosis not present

## 2023-08-14 DIAGNOSIS — M79675 Pain in left toe(s): Secondary | ICD-10-CM

## 2023-08-14 DIAGNOSIS — M79674 Pain in right toe(s): Secondary | ICD-10-CM | POA: Diagnosis not present

## 2023-08-14 DIAGNOSIS — I739 Peripheral vascular disease, unspecified: Secondary | ICD-10-CM

## 2023-08-14 DIAGNOSIS — B351 Tinea unguium: Secondary | ICD-10-CM | POA: Diagnosis not present

## 2023-08-14 NOTE — Progress Notes (Signed)
 Subjective:  Patient ID: Annette Huang, female    DOB: 11/10/1927,  MRN: [id number]  Annette Huang presents to clinic today for:  Chief Complaint  Patient presents with   [facility]    Not diabetic. Takes Plavix. Has corn/skin growth between right 4th & 5th.    Patient notes nails are thick and elongated, causing pain in shoe gear when ambulating.  She has the painful corn on the right fifth toe and fourth interspace.  Her daughter is with her today.  PCP is Nodal, Mickey Browner, PA-C.  Last seen 07/16/23  Past Medical History:  Diagnosis Date   Aortic stenosis 11/09/2014   Overview:  Mild in 2015 Moderate to severe in the October 2018   At high risk for injury related to fall 10/26/2021   Chronic respiratory failure with hypoxia (HCC) 08/06/2018   02 hs x 2lpm as of 08/05/2018 and prn daytime to keep sats > 90%  - 09/01/2018 no desats walking / limited by back    COPD (chronic obstructive pulmonary disease) (HCC)    Coronary artery disease    CVA (cerebral vascular accident) (HCC)    Diastolic congestive heart failure (HCC) 07/31/2018   DOE (dyspnea on exertion) 12/11/2016   Quit smoking 07/22/18 with emphysema on ct from 06/12/2018 - PFT 02/17/17    FEV1 1.35 [118%], ratio 0.82 with 14% resp to saba and TLC 93%, DLCO 73% - Echo 01/20/2018 c/w mod AS s PH - pt stopped vasotec and anoro on her own prior to pulmonary ov 08/05/2018 > rec leave off and rx for gerd x 4 weeks > improved as of 09/01/2018  - 09/01/2018   Walked RA x one lap =  approx 250 ft avg pace >> stopped due to   Essential hypertension 08/18/2013   rec Off acei permanently as of 08/05/2018 due to pseudowheeze    Fatigue 09/04/2021   Greater trochanteric bursitis of right hip 11/13/2017   Hyperlipemia 08/18/2013   Hyperlipidemia    Hypertension    Late effects of CVA (cerebrovascular accident) 11/09/2014   Lumbar degenerative disc disease 06/18/2013   Lumbar spondylosis 06/18/2013   Peripheral vascular disease, unspecified  (HCC) status post right carotic endarterectomy 08/28/2018   Sacroiliac inflammation (HCC) 11/13/2017   Sacroiliitis (HCC) 11/13/2017   Smoking 05/23/2015   Spinal stenosis of lumbar region with neurogenic claudication 08/18/2013   Stroke (HCC) 09/08/2013   Supplemental oxygen  dependent 10/26/2021   Thoracic aortic atherosclerosis (HCC)     Allergies  Allergen Reactions   Codeine Hives and Nausea Only   Objective:  Annette Huang is a pleasant 89 y.o. female in NAD. AAO x 3.  Vascular Examination: Patient has palpable DP pulse, absent PT pulse bilateral.  Delayed capillary refill bilateral toes.  Sparse digital hair bilateral.  Proximal to distal cooling WNL bilateral.    Dermatological Examination: Interspaces are clear with no open lesions noted bilateral.  Skin is shiny and atrophic bilateral.  Nails are 3-60mm thick, with yellowish/brown discoloration, subungual debris and distal onycholysis x10.  There is pain with compression of nails x10.  There are hyperkeratotic lesions noted on medial aspect of right fifth toe DIPJ and dorsal lateral aspect of right fifth toe PIPJ.  No ulceration is noted.  Patient qualifies for at-risk foot care because of PVD.  Assessment/Plan: 1. Pain due to onychomycosis of toenails of both feet   2. Corns   3. PVD (peripheral vascular disease) (HCC)    Mycotic nails x10  were sharply debrided with sterile nail nippers and power debriding burr to decrease bulk and length.  Hyperkeratotic lesions x 2 were shaved with #312 blade.  Only one of the lesions is proximal to the DIPJ of the toe  Return in about 3 months (around 11/14/2023) for RFC.   Awanda CHARM Imperial, DPM, FACFAS [facility]     [address] [place], [place] [address]                Office [phone]  Fax [phone]

## 2023-08-20 ENCOUNTER — Ambulatory Visit: Admitting: Cardiology

## 2023-08-28 ENCOUNTER — Encounter: Payer: Self-pay | Admitting: Cardiology

## 2023-08-28 ENCOUNTER — Ambulatory Visit: Attending: Cardiology | Admitting: Cardiology

## 2023-08-28 VITALS — BP 140/62 | HR 87 | Ht <= 58 in | Wt 144.4 lb

## 2023-08-28 DIAGNOSIS — I350 Nonrheumatic aortic (valve) stenosis: Secondary | ICD-10-CM | POA: Diagnosis not present

## 2023-08-28 DIAGNOSIS — I739 Peripheral vascular disease, unspecified: Secondary | ICD-10-CM | POA: Diagnosis not present

## 2023-08-28 DIAGNOSIS — I6990 Unspecified sequelae of unspecified cerebrovascular disease: Secondary | ICD-10-CM | POA: Diagnosis not present

## 2023-08-28 DIAGNOSIS — I10 Essential (primary) hypertension: Secondary | ICD-10-CM | POA: Diagnosis not present

## 2023-08-28 NOTE — Progress Notes (Signed)
 Cardiology Office Note:    Date:  08/28/2023   ID:  Annette Huang, DOB [birth year], MRN [id number]  PCP:  Hunter Mickey Browner, PA-C  Cardiologist:  Lamar Fitch, MD    Referring MD: Hunter Mickey Browner, PA-C   Chief Complaint  Patient presents with   Follow-up    History of Present Illness:    Annette Huang is a 89 y.o. female past medical history significant for aortic stenosis which is moderate to severe, COPD, history of CVA, essential hypertension, comes today 2 months for follow-up overall doing fine overall she looks good.  She said every about 2 weeks she will have very tired then she sleeps all day.  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 activity daily living comes like always to months with her daughter  Past Medical History:  Diagnosis Date   Aortic stenosis 11/09/2014   Overview:  Mild in 2015 Moderate to severe in the October 2018   At high risk for injury related to fall 10/26/2021   Chronic respiratory failure with hypoxia (HCC) 08/06/2018   02 hs x 2lpm as of 08/05/2018 and prn daytime to keep sats > 90%  - 09/01/2018 no desats walking / limited by back    COPD (chronic obstructive pulmonary disease) (HCC)    Coronary artery disease    CVA (cerebral vascular accident) (HCC)    Diastolic congestive heart failure (HCC) 07/31/2018   DOE (dyspnea on exertion) 12/11/2016   Quit smoking 07/22/18 with emphysema on ct from 06/12/2018 - PFT 02/17/17    FEV1 1.35 [118%], ratio 0.82 with 14% resp to saba and TLC 93%, DLCO 73% - Echo 01/20/2018 c/w mod AS s PH - pt stopped vasotec and anoro on her own prior to pulmonary ov 08/05/2018 > rec leave off and rx for gerd x 4 weeks > improved as of 09/01/2018  - 09/01/2018   Walked RA x one lap =  approx 250 ft avg pace >> stopped due to   Essential hypertension 08/18/2013   rec Off acei permanently as of 08/05/2018 due to pseudowheeze    Fatigue 09/04/2021   Greater trochanteric bursitis of right hip 11/13/2017   Hyperlipemia 08/18/2013    Hyperlipidemia    Hypertension    Late effects of CVA (cerebrovascular accident) 11/09/2014   Lumbar degenerative disc disease 06/18/2013   Lumbar spondylosis 06/18/2013   Peripheral vascular disease, unspecified (HCC) status post right carotic endarterectomy 08/28/2018   Sacroiliac inflammation (HCC) 11/13/2017   Sacroiliitis (HCC) 11/13/2017   Smoking 05/23/2015   Spinal stenosis of lumbar region with neurogenic claudication 08/18/2013   Stroke (HCC) 09/08/2013   Supplemental oxygen  dependent 10/26/2021   Thoracic aortic atherosclerosis (HCC)     Past Surgical History:  Procedure Laterality Date   CAROTID ENDARTERECTOMY Bilateral    NECK SURGERY      Current Medications: Current Meds  Medication Sig   albuterol  (PROVENTIL ) (2.5 MG/3ML) 0.083% nebulizer solution Take 3 mLs (2.5 mg total) by nebulization every 6 (six) hours as needed for wheezing or shortness of breath.   albuterol  (VENTOLIN  HFA) 108 (90 Base) MCG/ACT inhaler Inhale 1 puff into the lungs as needed for wheezing or shortness of breath.   ALPRAZolam (XANAX) 0.25 MG tablet Take 0.25 mg by mouth daily as needed for anxiety or sleep.   atorvastatin  (LIPITOR) 40 MG tablet Take 1 tablet (40 mg total) by mouth daily.   benzonatate (TESSALON) 200 MG capsule Take 200 mg by mouth 3 (three) times daily as needed  for cough.   budesonide  (PULMICORT ) 0.25 MG/2ML nebulizer solution Take 2 mLs (0.25 mg total) by nebulization 2 (two) times daily.   clopidogrel (PLAVIX) 75 MG tablet Take 75 mg by mouth daily.    Cyanocobalamin (VITAMIN B12 PO) Take 1 tablet by mouth daily. Unknown strenght   famotidine  (PEPCID ) 20 MG tablet One after supper (Patient taking differently: Take 20 mg by mouth daily after supper. One after supper)   furosemide  (LASIX ) 40 MG tablet Take 1.5 tablets (60 mg total) by mouth daily.   ketoconazole  (NIZORAL ) 2 % cream Apply 1 Application topically daily. Apply 1gm to affected area of feet and rub in well.    Lactobacillus Rhamnosus, GG, (CULTURELLE) CAPS Take 1 capsule by mouth daily.   levothyroxine (SYNTHROID, LEVOTHROID) 50 MCG tablet Take 1 tablet daily by mouth.   lidocaine (LIDODERM) 5 % Place 1 patch onto the skin as needed for pain.   magnesium oxide (MAG-OX) 400 (240 Mg) MG tablet Take 1 tablet by mouth 2 (two) times daily.   metoprolol  tartrate (LOPRESSOR ) 25 MG tablet Take 1 tablet (25 mg total) by mouth 2 (two) times daily.   OXYGEN  Inhale 2 L into the lungs as needed (Mainly at bedtime). 2lpm as needed   pantoprazole  (PROTONIX ) 40 MG tablet Take 40 mg by mouth daily.   potassium chloride  (MICRO-K ) 10 MEQ CR capsule Take 1 capsule (10 mEq total) by mouth daily.   sodium chloride  HYPERTONIC 3 % nebulizer solution Take by nebulization 3 (three) times daily as needed for other.   traMADol (ULTRAM) 50 MG tablet Take 50 mg by mouth as needed for moderate pain or severe pain.   traZODone (DESYREL) 50 MG tablet Take 50 mg by mouth as needed for sleep.   VITAMIN D, ERGOCALCIFEROL, PO Take 1,000 Units by mouth daily.     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  Tobacco Use   Smoking status: Former    Current packs/day: 0.00    Average packs/day: 1 pack/day for 35.0 years (35.0 ttl pk-yrs)    Types: Cigarettes    Start date: 7    Quit date: 2023    Years since quitting: 2.5   Smokeless tobacco: Never  Vaping Use   Vaping status: Former  Substance and Sexual Activity   Alcohol use: No   Drug use: No   Sexual activity: Not on file  Other Topics Concern   Not on file  Social History Narrative   Not on file   Social Drivers of Health   Financial Resource Strain: Not on file  Food Insecurity: Low Risk  (07/17/2022)   Received  from [facility]   Hunger Vital Sign    Within the past 12 months, you worried that your food would run out before you got money to buy more: Never true    Within the past 12 months, the food you bought just didn't last and you didn't have money to get more. : Never true  Transportation Needs: Not on file (07/17/2022)  Physical Activity: Not on file  Stress: Not on file  Social Connections: Not on file     Family History: The patient's family history includes Heart  disease in her father and mother; Hypertension in her mother. ROS:   Please see the history of present illness.    All 14 point review of systems negative except as described per history of present illness  EKGs/Labs/Other Studies Reviewed:         Recent Labs: 04/01/2023: BUN 23; Creatinine, Ser 1.45; Magnesium 2.3; Potassium 4.2; Sodium 141  Recent Lipid Panel    Component Value Date/Time   CHOL 162 11/19/2021 1123   TRIG 90 11/19/2021 1123   HDL 61 11/19/2021 1123   CHOLHDL 2.7 11/19/2021 1123   LDLCALC 84 11/19/2021 1123    Physical Exam:    VS:  BP (!) 140/62 (BP Location: Left Arm, Patient Position: Sitting)   Pulse 87   Ht 4' 9 (1.448 m)   Wt 144 lb 6.4 oz (65.5 kg)   SpO2 99%   BMI 31.25 kg/m     Wt Readings from Last 3 Encounters:  08/28/23 144 lb 6.4 oz (65.5 kg)  05/07/23 141 lb 12.8 oz (64.3 kg)  03/25/23 145 lb 6.4 oz (66 kg)     GEN:  Well nourished, well developed in no acute distress HEENT: Normal NECK: No JVD; No carotid bruits LYMPHATICS: No lymphadenopathy CARDIAC: RRR, ejection murmur grade 2/6 to 3/6 posterior right upper portion of sternum, no rubs, no gallops RESPIRATORY:  Clear to auscultation without rales, wheezing or rhonchi  ABDOMEN: Soft, non-tender, non-distended MUSCULOSKELETAL:  No edema; No deformity  SKIN: Warm and dry LOWER EXTREMITIES: no swelling NEUROLOGIC:  Alert and oriented x 3 PSYCHIATRIC:  Normal affect   ASSESSMENT:    1. Nonrheumatic aortic valve  stenosis   2. Peripheral vascular disease, unspecified (HCC) status post right carotic endarterectomy   3. Essential hypertension   4. Late effects of CVA (cerebrovascular accident)    PLAN:    In order of problems listed above:  Nonrheumatic aortic valve stenosis, not critical.  We had a long discussion with her as well as her daughter about the time elected not to pursue any surgical intervention which I agree with.  Likely she does not have any signs and symptoms of critical aortic stenosis and parameters are not in the area. Peripheral vascular disease status post carotic endarterectomy doing well. Essential hypertension blood pressure well-controlled. Dyslipidemia I did review K PN, LDL 84 HDL 61, she is on Lipitor 40 which I will continue   Medication Adjustments/Labs and Tests Ordered: Current medicines are reviewed at length with the patient today.  Concerns regarding medicines are outlined above.  No orders of the defined types were placed in this encounter.  Medication changes: No orders of the defined types were placed in this encounter.   Signed, Lamar DOROTHA Fitch, MD, [facility] 08/28/2023 1:06 PM    [facility]

## 2023-08-28 NOTE — Patient Instructions (Signed)

## 2023-10-27 ENCOUNTER — Ambulatory Visit: Payer: Self-pay | Admitting: Internal Medicine

## 2023-10-27 NOTE — Telephone Encounter (Signed)
 1st attempt to contact patient, no answer, LVM to call back, routing for additional attempts.     Copied from CRM [id number]. Topic: Clinical - Medication Question >> Oct 27, 2023 12:52 PM Leotis ORN wrote: Reason for CRM: patient's daughter Alisa Rakers ( on [place]) stated the patient was reading her medication packet and it showed that  budesonide  (PULMICORT ) 0.25 MG/2ML nebulizer solution  is supposed to be used with a jet nebulizer, Alisa wants to know if  a new nebulizer is needed and if so can the other nebulizer solutions be used with that machine as well or is it okay to continue usage as she has done the past  3 years now   patient call back [phone]

## 2023-10-27 NOTE — Telephone Encounter (Signed)
 FYI Only or Action Required?: Action required by provider: clinical question for provider.  Patient is followed in Pulmonology for COPD, last seen on 05/07/2023 by Meade Verdon RAMAN, MD.  Called Nurse Triage reporting Medication Question .  Triage Disposition: Call PCP When Office is Open  Patient/caregiver understands and will follow disposition?: Yes     Copied from CRM [id number]. Topic: Clinical - Medication Question >> Oct 27, 2023 12:52 PM [place] ORN wrote: Reason for CRM: patient's daughter Alisa Rakers ( on [place]) stated the patient was reading her medication packet and it showed that  budesonide  (PULMICORT ) 0.25 MG/2ML nebulizer solution  is supposed to be used with a jet nebulizer, Alisa wants to know if  a new nebulizer is needed and if so can the other nebulizer solutions be used with that machine as well or is it okay to continue usage as she has done the past  3 years now   patient call back [phone]       Reason for Disposition  [1] Caller has NON-URGENT medicine question about med that PCP prescribed AND [2] triager unable to answer question  Answer Assessment - Initial Assessment Questions Patient's daughter advised that the nebulizer appears to be a jet nebulizer and if she has been using it until now she should continue to do so.      1. NAME of MEDICINE: What medicine(s) are you calling about?     budesonide  (PULMICORT ) 0.25 MG/2ML nebulizer solution 2. QUESTION: What is your question? (e.g., double dose of medicine, side effect)     Patient's daughter would like to know if the [employer] [id number] can be used for the Pulmicort  and when she should be concerned about replacing the nebulizer due to age. Please advise.  Protocols used: Medication Question Call-A-AH

## 2023-10-28 NOTE — Telephone Encounter (Signed)
 ATC x1.  Left detailed message to return call.

## 2023-10-29 NOTE — Telephone Encounter (Signed)
ATC x2.  LVM to return call.  Mychart message sent to patient.

## 2023-11-13 ENCOUNTER — Ambulatory Visit: Admitting: Podiatry

## 2023-11-20 ENCOUNTER — Ambulatory Visit: Admitting: Podiatry

## 2023-11-27 ENCOUNTER — Ambulatory Visit: Admitting: Podiatry

## 2023-11-27 DIAGNOSIS — I739 Peripheral vascular disease, unspecified: Secondary | ICD-10-CM

## 2023-11-27 DIAGNOSIS — M79675 Pain in left toe(s): Secondary | ICD-10-CM

## 2023-11-27 DIAGNOSIS — L84 Corns and callosities: Secondary | ICD-10-CM | POA: Diagnosis not present

## 2023-11-27 DIAGNOSIS — M79674 Pain in right toe(s): Secondary | ICD-10-CM | POA: Diagnosis not present

## 2023-11-27 DIAGNOSIS — B351 Tinea unguium: Secondary | ICD-10-CM | POA: Diagnosis not present

## 2023-11-27 NOTE — Progress Notes (Signed)
 Subjective:  Patient ID: Annette Huang, female    DOB: 11/20/1927,  MRN: [id number]  Annette Huang presents to clinic today for:  Chief Complaint  Patient presents with   RFC    RFC , no callous Not diabetic and Plavix   Patient notes nails are thick and elongated, causing pain in shoe gear when ambulating.  She has the painful corn on the right fifth toe and and right submet 5.  She is on oxygen .  PCP is Nodal, Mickey Browner, PA-C.  Last seen 07/16/23  Past Medical History:  Diagnosis Date   Aortic stenosis 11/09/2014   Overview:  Mild in 2015 Moderate to severe in the October 2018   At high risk for injury related to fall 10/26/2021   Chronic respiratory failure with hypoxia (HCC) 08/06/2018   02 hs x 2lpm as of 08/05/2018 and prn daytime to keep sats > 90%  - 09/01/2018 no desats walking / limited by back    COPD (chronic obstructive pulmonary disease) (HCC)    Coronary artery disease    CVA (cerebral vascular accident) (HCC)    Diastolic congestive heart failure (HCC) 07/31/2018   DOE (dyspnea on exertion) 12/11/2016   Quit smoking 07/22/18 with emphysema on ct from 06/12/2018 - PFT 02/17/17    FEV1 1.35 [118%], ratio 0.82 with 14% resp to saba and TLC 93%, DLCO 73% - Echo 01/20/2018 c/w mod AS s PH - pt stopped vasotec and anoro on her own prior to pulmonary ov 08/05/2018 > rec leave off and rx for gerd x 4 weeks > improved as of 09/01/2018  - 09/01/2018   Walked RA x one lap =  approx 250 ft avg pace >> stopped due to   Essential hypertension 08/18/2013   rec Off acei permanently as of 08/05/2018 due to pseudowheeze    Fatigue 09/04/2021   Greater trochanteric bursitis of right hip 11/13/2017   Hyperlipemia 08/18/2013   Hyperlipidemia    Hypertension    Late effects of CVA (cerebrovascular accident) 11/09/2014   Lumbar degenerative disc disease 06/18/2013   Lumbar spondylosis 06/18/2013   Peripheral vascular disease, unspecified (HCC) status post right carotic endarterectomy  08/28/2018   Sacroiliac inflammation 11/13/2017   Sacroiliitis 11/13/2017   Smoking 05/23/2015   Spinal stenosis of lumbar region with neurogenic claudication 08/18/2013   Stroke (HCC) 09/08/2013   Supplemental oxygen  dependent 10/26/2021   Thoracic aortic atherosclerosis     Allergies  Allergen Reactions   Codeine Hives and Nausea Only   Objective:  Annette Huang is a pleasant 89 y.o. female in NAD. AAO x 3.  Vascular Examination: Patient has palpable DP pulse, absent PT pulse bilateral.  Delayed capillary refill bilateral toes.  Sparse digital hair bilateral.  Proximal to distal cooling WNL bilateral.    Dermatological Examination: Interspaces are clear with no open lesions noted bilateral.  Skin is shiny and atrophic bilateral.  Nails are 3-101mm thick, with yellowish/brown discoloration, subungual debris and distal onycholysis x10.  There is pain with compression of nails x10.  There are hyperkeratotic lesions noted on medial aspect of right fifth toe PIPJ and right submet 5.  Patient qualifies for at-risk foot care because of PVD.  Assessment/Plan: 1. Pain due to onychomycosis of toenails of both feet   2. Corns   3. PVD (peripheral vascular disease)    Mycotic nails x10 were sharply debrided with sterile nail nippers and power debriding burr to decrease bulk and length.  Hyperkeratotic lesions x 2 were shaved with #312 blade.  The lesions are noted above in the dermatological findings.  She will continue with the gel mini toe caps for the right fifth toe.  Return in about 3 months (around 02/27/2024) for RFC.   Annette Huang, DPM, FACFAS [facility]     [address] [place], [place] [address]                Office [phone]  Fax [phone]

## 2024-01-09 DIAGNOSIS — I509 Heart failure, unspecified: Secondary | ICD-10-CM

## 2024-01-09 DIAGNOSIS — I517 Cardiomegaly: Secondary | ICD-10-CM

## 2024-01-09 DIAGNOSIS — J90 Pleural effusion, not elsewhere classified: Secondary | ICD-10-CM

## 2024-01-09 DIAGNOSIS — I350 Nonrheumatic aortic (valve) stenosis: Secondary | ICD-10-CM

## 2024-01-09 DIAGNOSIS — E785 Hyperlipidemia, unspecified: Secondary | ICD-10-CM

## 2024-01-09 DIAGNOSIS — I2510 Atherosclerotic heart disease of native coronary artery without angina pectoris: Secondary | ICD-10-CM

## 2024-01-09 DIAGNOSIS — I10 Essential (primary) hypertension: Secondary | ICD-10-CM

## 2024-02-12 ENCOUNTER — Ambulatory Visit: Admitting: Pulmonary Disease

## 2024-02-12 ENCOUNTER — Encounter: Payer: Self-pay | Admitting: Pulmonary Disease

## 2024-02-12 VITALS — BP 129/74 | HR 75 | Temp 98.2°F | Ht <= 58 in | Wt 139.4 lb

## 2024-02-12 DIAGNOSIS — Z87891 Personal history of nicotine dependence: Secondary | ICD-10-CM | POA: Diagnosis not present

## 2024-02-12 DIAGNOSIS — J449 Chronic obstructive pulmonary disease, unspecified: Secondary | ICD-10-CM | POA: Diagnosis not present

## 2024-02-12 DIAGNOSIS — J189 Pneumonia, unspecified organism: Secondary | ICD-10-CM | POA: Diagnosis not present

## 2024-02-12 DIAGNOSIS — J439 Emphysema, unspecified: Secondary | ICD-10-CM

## 2024-02-12 DIAGNOSIS — J9611 Chronic respiratory failure with hypoxia: Secondary | ICD-10-CM

## 2024-02-12 DIAGNOSIS — R9389 Abnormal findings on diagnostic imaging of other specified body structures: Secondary | ICD-10-CM

## 2024-02-12 DIAGNOSIS — J9610 Chronic respiratory failure, unspecified whether with hypoxia or hypercapnia: Secondary | ICD-10-CM | POA: Diagnosis not present

## 2024-02-12 NOTE — Patient Instructions (Signed)
 Repeat chest x-ray in about 4 weeks  Continue Pulmicort  twice a day  Use albuterol  up to 4 times a day as needed  You can wean down on nebulized saline, cut it down to once a day or none at all if you are not making a whole lot of secretions  Graded activities as tolerated  You can adjust your oxygen  supplementation to keep saturations above 90% depending on activity  Call us  with significant concerns

## 2024-02-12 NOTE — Progress Notes (Signed)
 "              Annette Huang    [id number]    [birth year]  Primary Care Physician:Nodal, Mickey Browner, PA-C  Referring Physician: Hunter Mickey Browner, PA-C [address] [place],  [place] [address]  Chief complaint:   Patient was seen in the office today accompanied by her daughter  HPI:  Recently treated for pneumonia  She is feeling better currently at least in the last 4 to 5 days She was treated in December, an x-ray was obtained at the time showing multifocal infiltrate Repeat chest x-ray recently did show persistence of some nodularity in the right base  She does have chronic obstructive pulmonary disease, chronic respiratory failure on oxygen  supplementation Oxygen  need fluctuates between 3 and 4 L depending on activity  She does have a portable concentrator  History of heart failure preserved ejection fraction, moderate aortic stenosis, prior history of CVA  She uses a nebulizer a few times a day with Pulmicort , albuterol , saline nebulization  She is feeling better we discussed about maintaining Pulmicort  and cutting down on saline nebulization, may also wean off Mucinex as he is not currently bringing up any significant secretions    Outpatient Encounter Medications as of 02/12/2024  Medication Sig   albuterol  (PROVENTIL ) (2.5 MG/3ML) 0.083% nebulizer solution Take 3 mLs (2.5 mg total) by nebulization every 6 (six) hours as needed for wheezing or shortness of breath.   albuterol  (VENTOLIN  HFA) 108 (90 Base) MCG/ACT inhaler Inhale 1 puff into the lungs as needed for wheezing or shortness of breath.   ALPRAZolam (XANAX) 0.25 MG tablet Take 0.25 mg by mouth daily as needed for anxiety or sleep.   atorvastatin  (LIPITOR) 40 MG tablet Take 1 tablet (40 mg total) by mouth daily.   benzonatate (TESSALON) 200 MG capsule Take 200 mg by mouth 3 (three) times daily as needed for cough.   budesonide  (PULMICORT ) 0.25 MG/2ML nebulizer solution Take 2 mLs (0.25 mg total) by  nebulization 2 (two) times daily.   clopidogrel (PLAVIX) 75 MG tablet Take 75 mg by mouth daily.    Cranberry-D Mannose (THERACRAN MAX) 158-500 MG CAPS Take 2 tablets by mouth daily.   Cyanocobalamin (VITAMIN B12 PO) Take 1 tablet by mouth daily. Unknown strenght   famotidine  (PEPCID ) 20 MG tablet One after supper   furosemide  (LASIX ) 40 MG tablet Take 1.5 tablets (60 mg total) by mouth daily.   ketoconazole  (NIZORAL ) 2 % cream Apply 1 Application topically daily. Apply 1gm to affected area of feet and rub in well.   Lactobacillus Rhamnosus, GG, (CULTURELLE) CAPS Take 1 capsule by mouth daily.   levofloxacin (LEVAQUIN) 500 MG tablet Take 500 mg by mouth.   levothyroxine (SYNTHROID, LEVOTHROID) 50 MCG tablet Take 1 tablet daily by mouth.   lidocaine (LIDODERM) 5 % Place 1 patch onto the skin as needed for pain.   magnesium oxide (MAG-OX) 400 (240 Mg) MG tablet Take 1 tablet by mouth 2 (two) times daily.   metoprolol  tartrate (LOPRESSOR ) 25 MG tablet Take 1 tablet (25 mg total) by mouth 2 (two) times daily.   OXYGEN  Inhale 2 L into the lungs as needed (Mainly at bedtime). 2lpm as needed   pantoprazole  (PROTONIX ) 40 MG tablet Take 40 mg by mouth daily.   potassium chloride  (MICRO-K ) 10 MEQ CR capsule Take 1 capsule (10 mEq total) by mouth daily.   sodium chloride  HYPERTONIC 3 % nebulizer solution Take by nebulization 3 (three) times daily  as needed for other.   traMADol (ULTRAM) 50 MG tablet Take 50 mg by mouth as needed for moderate pain or severe pain.   traZODone (DESYREL) 50 MG tablet Take 50 mg by mouth as needed for sleep.   VITAMIN D, ERGOCALCIFEROL, PO Take 1,000 Units by mouth daily.   [DISCONTINUED] OVER THE COUNTER MEDICATION D-mannason 500mg    No facility-administered encounter medications on file as of 02/12/2024.    Allergies as of 02/12/2024 - Review Complete 02/12/2024  Allergen Reaction Noted   Codeine Hives and Nausea Only 06/18/2013    Past Medical History:  Diagnosis  Date   Aortic stenosis 11/09/2014   Overview:  Mild in 2015 Moderate to severe in the October 2018   At high risk for injury related to fall 10/26/2021   Chronic respiratory failure with hypoxia (HCC) 08/06/2018   02 hs x 2lpm as of 08/05/2018 and prn daytime to keep sats > 90%  - 09/01/2018 no desats walking / limited by back    COPD (chronic obstructive pulmonary disease) (HCC)    Coronary artery disease    CVA (cerebral vascular accident) (HCC)    Diastolic congestive heart failure (HCC) 07/31/2018   DOE (dyspnea on exertion) 12/11/2016   Quit smoking 07/22/18 with emphysema on ct from 06/12/2018 - PFT 02/17/17    FEV1 1.35 [118%], ratio 0.82 with 14% resp to saba and TLC 93%, DLCO 73% - Echo 01/20/2018 c/w mod AS s PH - pt stopped vasotec and anoro on her own prior to pulmonary ov 08/05/2018 > rec leave off and rx for gerd x 4 weeks > improved as of 09/01/2018  - 09/01/2018   Walked RA x one lap =  approx 250 ft avg pace >> stopped due to   Essential hypertension 08/18/2013   rec Off acei permanently as of 08/05/2018 due to pseudowheeze    Fatigue 09/04/2021   Greater trochanteric bursitis of right hip 11/13/2017   Hyperlipemia 08/18/2013   Hyperlipidemia    Hypertension    Late effects of CVA (cerebrovascular accident) 11/09/2014   Lumbar degenerative disc disease 06/18/2013   Lumbar spondylosis 06/18/2013   Peripheral vascular disease, unspecified (HCC) status post right carotic endarterectomy 08/28/2018   Sacroiliac inflammation 11/13/2017   Sacroiliitis 11/13/2017   Smoking 05/23/2015   Spinal stenosis of lumbar region with neurogenic claudication 08/18/2013   Stroke (HCC) 09/08/2013   Supplemental oxygen  dependent 10/26/2021   Thoracic aortic atherosclerosis     Past Surgical History:  Procedure Laterality Date   CAROTID ENDARTERECTOMY Bilateral    NECK SURGERY      Family History  Problem Relation Age of Onset   Heart disease Mother    Hypertension Mother    Heart disease  Father     Social History   Socioeconomic History   Marital status: Widowed    Spouse name: Not on file   Number of children: 8   Years of education: Not on file   Highest education level: Not on file  Occupational History   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  Tobacco Use   Smoking status: Former    Current packs/day: 0.00    Average packs/day: 1 pack/day for 35.0 years (35.0 ttl pk-yrs)    Types: Cigarettes    Start date: 56    Quit date: 2023    Years since quitting: 3.0   Smokeless tobacco: Never  Vaping Use   Vaping status: Former  Substance and Sexual Activity   Alcohol use:  No   Drug use: No   Sexual activity: Not on file  Other Topics Concern   Not on file  Social History Narrative   Not on file   Social Drivers of Health   Tobacco Use: Medium Risk (02/12/2024)   Patient History    Smoking Tobacco Use: Former    Smokeless Tobacco Use: Never    Passive Exposure: Not on file  Financial Resource Strain: Not on file  Food Insecurity: Low Risk (01/26/2024)   Received from [facility]   Epic    Within the past 12 months, you worried that your food would run out before you got money to buy more: Never true    Within the past 12 months, the food you bought just didn't last and you didn't have money to get more. : Never true  Transportation Needs: No Transportation Needs (01/26/2024)   Received from [facility]    In the past 12 months, has lack of reliable transportation kept you from medical appointments, meetings, work or from getting things needed for daily living? : No  Physical Activity: Not on file  Stress: Not on file  Social Connections: Not on file  Intimate Partner Violence: Not on file  Depression (EYV7-0): Not on file  Alcohol Screen: Not on file  Housing: Low Risk (01/26/2024)   Received from [facility]   Epic    What is your living situation today?: I have a steady place to live    Think about the place  you live. Do you have problems with any of the following? Choose all that apply:: None/None on this list  Utilities: Low Risk (01/26/2024)   Received from [facility]   Utilities    In the past 12 months has the electric, gas, oil, or water company threatened to shut off services in your home? : No  Health Literacy: Not on file    Review of Systems  Constitutional:  Negative for fatigue.  Respiratory:  Positive for shortness of breath. Negative for cough.     Vitals:   02/12/24 1517  BP: 129/74  Pulse: 75  Temp: 98.2 F (36.8 C)  SpO2: 99%     Physical Exam Constitutional:      Appearance: Normal appearance.  HENT:     Head: Normocephalic.     Nose: Nose normal.     Mouth/Throat:     Mouth: Mucous membranes are moist.  Eyes:     General: No scleral icterus. Cardiovascular:     Rate and Rhythm: Normal rate and regular rhythm.     Heart sounds: No murmur heard.    No friction rub.  Pulmonary:     Effort: No respiratory distress.     Breath sounds: No stridor. No wheezing or rhonchi.  Musculoskeletal:     Cervical back: No rigidity or tenderness.  Neurological:     Mental Status: She is alert.  Psychiatric:        Mood and Affect: Mood normal.    Data Reviewed: Results of the most recent chest x-rays from 02/06/2024, 01/26/2024 reviewed  Spirometry from 2023 reveals FVC 1 of 63 percent  Echocardiogram May 2025 with normal ejection fraction of 60 to 65%, grade 2 diastolic dysfunction, normal right ventricular systolic function, mildly elevated pulmonary artery systolic pressure, left atrial size was moderately dilated  Most recent visit with Dr. Meade was reviewed  Assessment/Plan: Chronic obstructive pulm disease - Continue bronchodilator therapy  Chronic respiratory failure - Encouraged  to adjust oxygen  supplementation as needed to maintain saturations greater than 90% - During evaluation today was done down to 2 L on the Inogen  Recent pneumonia - As  she is feeling better I do not believe any changes need to be made to current treatment - Will benefit from repeating an x-ray in about 4 weeks  Encourage graded activities as tolerated  Encouraged to call with any significant concerns  I personally spent a total of 32 minutes in the care of the patient today including preparing to see the patient, getting/reviewing separately obtained history, performing a medically appropriate exam/evaluation, counseling and educating, documenting clinical information in the EHR, and independently interpreting results.   Jennet Epley MD [facility] 02/12/2024, 4:43 PM  CC: Nodal, Mickey Browner, PA-C   "

## 2024-02-19 ENCOUNTER — Other Ambulatory Visit (HOSPITAL_BASED_OUTPATIENT_CLINIC_OR_DEPARTMENT_OTHER): Payer: Self-pay

## 2024-02-20 ENCOUNTER — Other Ambulatory Visit (HOSPITAL_BASED_OUTPATIENT_CLINIC_OR_DEPARTMENT_OTHER): Payer: Self-pay

## 2024-02-27 ENCOUNTER — Ambulatory Visit: Admitting: Podiatry

## 2024-02-27 DIAGNOSIS — L84 Corns and callosities: Secondary | ICD-10-CM | POA: Diagnosis not present

## 2024-02-27 DIAGNOSIS — M79674 Pain in right toe(s): Secondary | ICD-10-CM

## 2024-02-27 DIAGNOSIS — B351 Tinea unguium: Secondary | ICD-10-CM

## 2024-02-27 DIAGNOSIS — M79675 Pain in left toe(s): Secondary | ICD-10-CM

## 2024-02-27 DIAGNOSIS — I739 Peripheral vascular disease, unspecified: Secondary | ICD-10-CM | POA: Diagnosis not present

## 2024-02-27 NOTE — Progress Notes (Signed)
 "      Subjective:  Patient ID: Annette Huang, female    DOB: 1927-06-05,  MRN: [id number]  Annette Huang presents to clinic today for:  Chief Complaint  Patient presents with   RFC    RFC, I checked the webbing of right 4/5 did not see or feel anything. She does have the spots on the lateral side of toe 5 bilaterally where shoes rub, but these are smooth today.  Not diabetic and takes Plavix.    Patient notes nails are thick and elongated, causing pain in shoe gear when ambulating.  She has the painful corn on the right fifth toe and and right submet 5.  She is on oxygen .  PCP is Nodal, Mickey Browner, PA-C.  Last seen 07/16/23  Past Medical History:  Diagnosis Date   Aortic stenosis 11/09/2014   Overview:  Mild in 2015 Moderate to severe in the October 2018   At high risk for injury related to fall 10/26/2021   Chronic respiratory failure with hypoxia (HCC) 08/06/2018   02 hs x 2lpm as of 08/05/2018 and prn daytime to keep sats > 90%  - 09/01/2018 no desats walking / limited by back    COPD (chronic obstructive pulmonary disease) (HCC)    Coronary artery disease    CVA (cerebral vascular accident) (HCC)    Diastolic congestive heart failure (HCC) 07/31/2018   DOE (dyspnea on exertion) 12/11/2016   Quit smoking 07/22/18 with emphysema on ct from 06/12/2018 - PFT 02/17/17    FEV1 1.35 [118%], ratio 0.82 with 14% resp to saba and TLC 93%, DLCO 73% - Echo 01/20/2018 c/w mod AS s PH - pt stopped vasotec and anoro on her own prior to pulmonary ov 08/05/2018 > rec leave off and rx for gerd x 4 weeks > improved as of 09/01/2018  - 09/01/2018   Walked RA x one lap =  approx 250 ft avg pace >> stopped due to   Essential hypertension 08/18/2013   rec Off acei permanently as of 08/05/2018 due to pseudowheeze    Fatigue 09/04/2021   Greater trochanteric bursitis of right hip 11/13/2017   Hyperlipemia 08/18/2013   Hyperlipidemia    Hypertension    Late effects of CVA (cerebrovascular accident) 11/09/2014    Lumbar degenerative disc disease 06/18/2013   Lumbar spondylosis 06/18/2013   Peripheral vascular disease, unspecified (HCC) status post right carotic endarterectomy 08/28/2018   Sacroiliac inflammation 11/13/2017   Sacroiliitis 11/13/2017   Smoking 05/23/2015   Spinal stenosis of lumbar region with neurogenic claudication 08/18/2013   Stroke (HCC) 09/08/2013   Supplemental oxygen  dependent 10/26/2021   Thoracic aortic atherosclerosis     Allergies  Allergen Reactions   Codeine Hives and Nausea Only   Objective:  Annette Huang is a pleasant 89 y.o. female in NAD. AAO x 3.  Vascular Examination: Patient has palpable DP pulse, absent PT pulse bilateral.  Delayed capillary refill bilateral toes.  Sparse digital hair bilateral.  Proximal to distal cooling WNL bilateral.    Dermatological Examination: Interspaces are clear with no open lesions noted bilateral.  Skin is shiny and atrophic bilateral.  Nails are 3-27mm thick, with yellowish/brown discoloration, subungual debris and distal onycholysis x10.  There is pain with compression of nails x10.  There are hyperkeratotic lesions noted on medial aspect of right fifth toe PIPJ and right submet 5.  Patient qualifies for at-risk foot care because of PVD.  Assessment/Plan: 1. Pain due to onychomycosis of toenails of  both feet   2. Corns   3. PVD (peripheral vascular disease)    Mycotic nails x10 were sharply debrided with sterile nail nippers and power debriding burr to decrease bulk and length.  Hyperkeratotic lesions x 2 were shaved with #312 blade.  The lesions are noted above in the dermatological findings and they are proximal to the toe DIPJ's.  She will continue with the gel mini toe caps for the right fifth toe.  Return in about 3 months (around 05/27/2024) for RFC.   Annette Huang, DPM, FACFAS [facility]     [address] [place], [place] [address]                 Office [phone]  Fax [phone] "

## 2024-03-04 DIAGNOSIS — J449 Chronic obstructive pulmonary disease, unspecified: Secondary | ICD-10-CM | POA: Insufficient documentation

## 2024-03-05 ENCOUNTER — Encounter: Payer: Self-pay | Admitting: Cardiology

## 2024-03-05 ENCOUNTER — Ambulatory Visit: Payer: Self-pay | Attending: Cardiology | Admitting: Cardiology

## 2024-03-05 VITALS — BP 154/64 | HR 94 | Ht <= 58 in | Wt 147.0 lb

## 2024-03-05 DIAGNOSIS — I350 Nonrheumatic aortic (valve) stenosis: Secondary | ICD-10-CM | POA: Diagnosis not present

## 2024-03-05 DIAGNOSIS — I10 Essential (primary) hypertension: Secondary | ICD-10-CM | POA: Diagnosis not present

## 2024-03-05 DIAGNOSIS — J9611 Chronic respiratory failure with hypoxia: Secondary | ICD-10-CM

## 2024-03-05 NOTE — Addendum Note (Signed)
 Addended by: ARLOA MALLORY D on: 03/05/2024 02:15 PM   Modules accepted: Orders

## 2024-03-05 NOTE — Patient Instructions (Signed)
 Medication Instructions:  Your physician recommends that you continue on your current medications as directed. Please refer to the Current Medication list given to you today.  *If you need a refill on your cardiac medications before your next appointment, please call your pharmacy*   Lab Work: BMP, Mgm- today If you have labs (blood work) drawn today and your tests are completely normal, you will receive your results only by: MyChart Message (if you have MyChart) OR A paper copy in the mail If you have any lab test that is abnormal or we need to change your treatment, we will call you to review the results.   Testing/Procedures: None Ordered   Follow-Up: At [facility], you and your health needs are our priority.  As part of our continuing mission to provide you with exceptional heart care, we have created designated Provider Care Teams.  These Care Teams include your primary Cardiologist (physician) and Advanced Practice Providers (APPs -  Physician Assistants and Nurse Practitioners) who all work together to provide you with the care you need, when you need it.  We recommend signing up for the patient portal called MyChart.  Sign up information is provided on this After Visit Summary.  MyChart is used to connect with patients for Virtual Visits (Telemedicine).  Patients are able to view lab/test results, encounter notes, upcoming appointments, etc.  Non-urgent messages can be sent to your provider as well.   To learn more about what you can do with MyChart, go to [email or web address].    Your next appointment:   3 month(s)  The format for your next appointment:   In Person  Provider:   Lamar Fitch, MD    Other Instructions NA

## 2024-03-06 LAB — BASIC METABOLIC PANEL WITH GFR
BUN/Creatinine Ratio: 16 (ref 12–28)
BUN: 19 mg/dL (ref 10–36)
CO2: 26 mmol/L (ref 20–29)
Calcium: 9.8 mg/dL (ref 8.7–10.3)
Chloride: 102 mmol/L (ref 96–106)
Creatinine, Ser: 1.22 mg/dL — ABNORMAL HIGH (ref 0.57–1.00)
Glucose: 101 mg/dL — ABNORMAL HIGH (ref 70–99)
Potassium: 4.3 mmol/L (ref 3.5–5.2)
Sodium: 142 mmol/L (ref 134–144)
eGFR: 41 mL/min/{1.73_m2} — ABNORMAL LOW

## 2024-03-06 LAB — MAGNESIUM: Magnesium: 1.5 mg/dL — ABNORMAL LOW (ref 1.6–2.3)

## 2024-03-08 ENCOUNTER — Ambulatory Visit: Payer: Self-pay | Admitting: Cardiology

## 2024-03-09 ENCOUNTER — Telehealth: Payer: Self-pay

## 2024-03-09 NOTE — Telephone Encounter (Signed)
 Pt viewed  Lab results on My Chart per Dr. Vanetta Shawl note. Routed to PCP.

## 2024-04-14 IMAGING — DX DG CHEST 2V
2 series · 2 of 2 positions shown · non-contrast
Comparison: Portable chest 07/08/2021.

CLINICAL DATA: Shortness of breath.

EXAM:
CHEST - 2 VIEW

[chest pa]
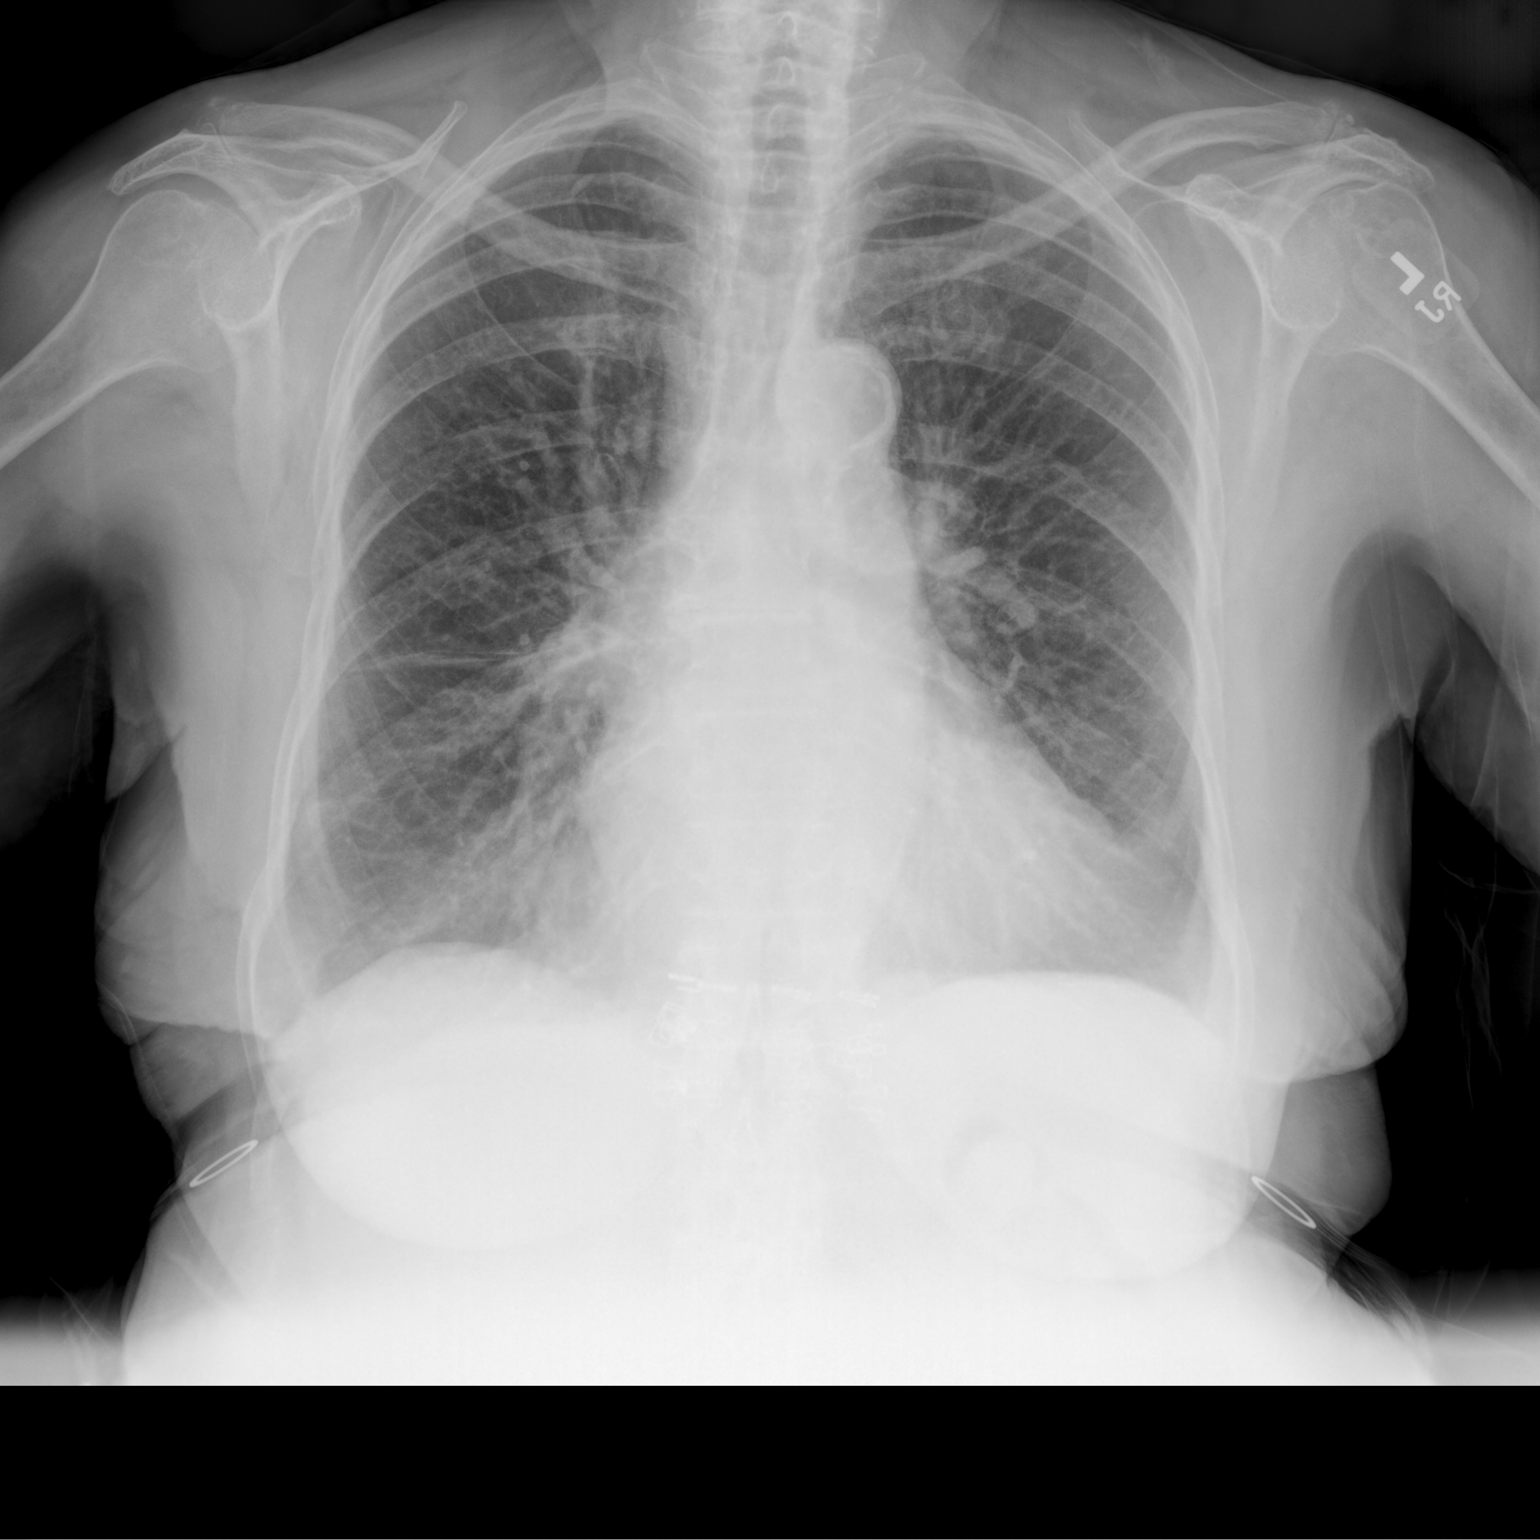

[chest lat]
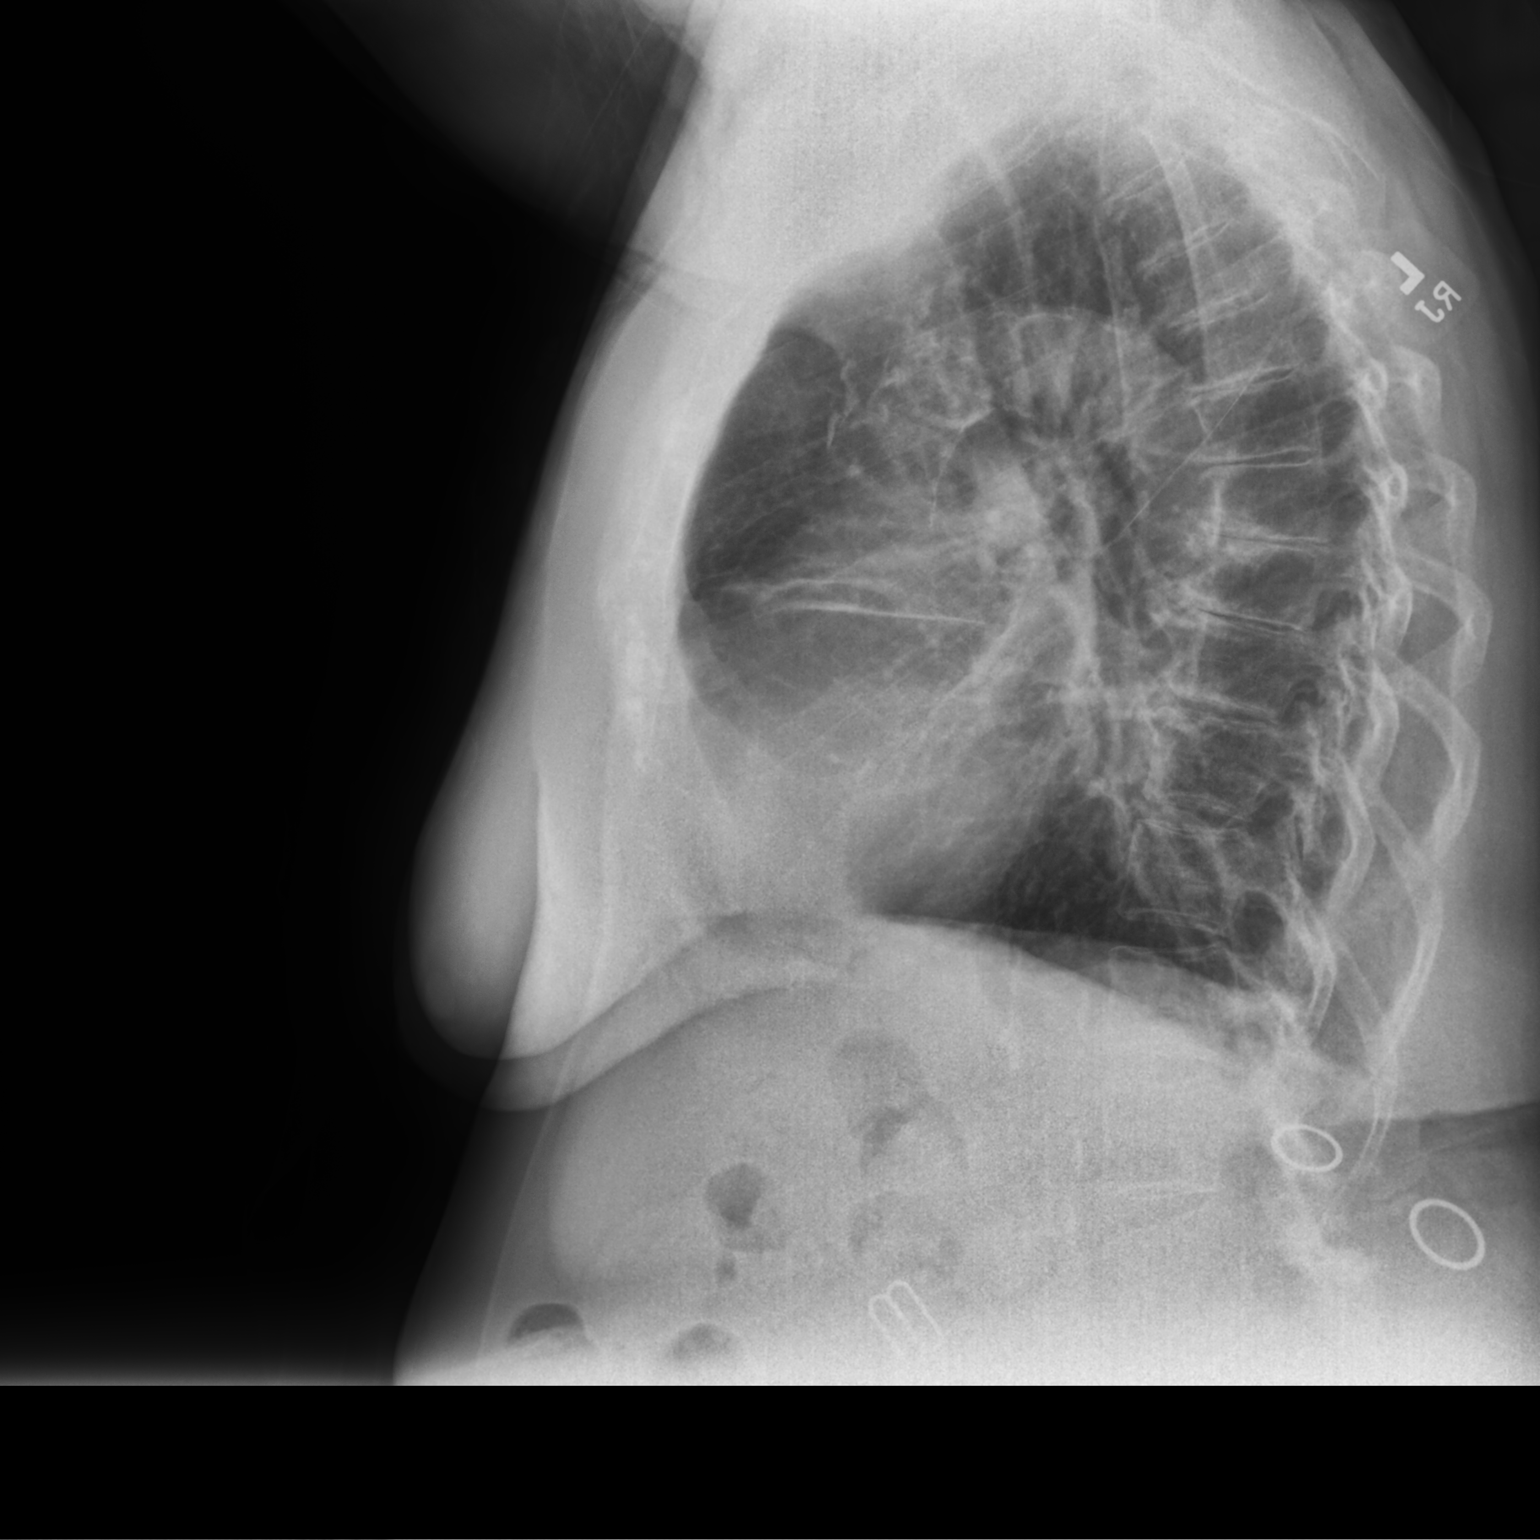

[2 of 2 positions shown; findings below may reference images not displayed]

FINDINGS: The heart is upper-normal in size. There is interval development of
perihilar vascular congestion and flow cephalization and mild
interstitial edema in the lung bases. Small pleural effusions are
similar to the prior study. There is patchy haziness in the lung
bases which could be atelectasis or ground-glass edema versus
pneumonitis.

The remaining lungs are clear of focal opacities. There is
calcification in the aortic arch. Mild thoracic kyphodextroscoliosis
with multilevel degenerative discs.
IMPRESSION: Upper-normal cardiac size with perihilar vascular congestion and
basal interstitial edema consistent with CHF or fluid overload.
Small pleural effusions are similar to the prior study. Aortic
atherosclerosis.

## 2024-05-25 ENCOUNTER — Ambulatory Visit: Admitting: Pulmonary Disease

## 2024-05-27 ENCOUNTER — Ambulatory Visit: Admitting: Podiatry

## 2024-06-02 ENCOUNTER — Ambulatory Visit: Admitting: Cardiology
